# Patient Record
Sex: Male | Born: 1950 | Race: White | Hispanic: No | Marital: Married | Smoking: Current every day smoker
Health system: Southern US, Community
[De-identification: ages and names within clinical notes are randomized; demographics above are authoritative.]

## PROBLEM LIST (undated history)

## (undated) DIAGNOSIS — R7303 Prediabetes: Secondary | ICD-10-CM

## (undated) DIAGNOSIS — I4891 Unspecified atrial fibrillation: Secondary | ICD-10-CM

## (undated) DIAGNOSIS — I739 Peripheral vascular disease, unspecified: Secondary | ICD-10-CM

## (undated) DIAGNOSIS — J449 Chronic obstructive pulmonary disease, unspecified: Secondary | ICD-10-CM

## (undated) DIAGNOSIS — Z95810 Presence of automatic (implantable) cardiac defibrillator: Secondary | ICD-10-CM

## (undated) DIAGNOSIS — I2510 Atherosclerotic heart disease of native coronary artery without angina pectoris: Secondary | ICD-10-CM

## (undated) DIAGNOSIS — I509 Heart failure, unspecified: Secondary | ICD-10-CM

## (undated) DIAGNOSIS — E7800 Pure hypercholesterolemia, unspecified: Secondary | ICD-10-CM

## (undated) DIAGNOSIS — Z9981 Dependence on supplemental oxygen: Secondary | ICD-10-CM

## (undated) DIAGNOSIS — I499 Cardiac arrhythmia, unspecified: Secondary | ICD-10-CM

## (undated) DIAGNOSIS — I219 Acute myocardial infarction, unspecified: Secondary | ICD-10-CM

## (undated) DIAGNOSIS — T8859XA Other complications of anesthesia, initial encounter: Secondary | ICD-10-CM

## (undated) DIAGNOSIS — I255 Ischemic cardiomyopathy: Secondary | ICD-10-CM

## (undated) DIAGNOSIS — I4819 Other persistent atrial fibrillation: Secondary | ICD-10-CM

## (undated) DIAGNOSIS — T4145XA Adverse effect of unspecified anesthetic, initial encounter: Secondary | ICD-10-CM

## (undated) DIAGNOSIS — F419 Anxiety disorder, unspecified: Secondary | ICD-10-CM

## (undated) DIAGNOSIS — I10 Essential (primary) hypertension: Secondary | ICD-10-CM

## (undated) HISTORY — PX: CORONARY ANGIOPLASTY: SHX604

## (undated) HISTORY — PX: ANGIOPLASTY / STENTING FEMORAL: SUR30

---

## 2000-10-21 DIAGNOSIS — I219 Acute myocardial infarction, unspecified: Secondary | ICD-10-CM

## 2000-10-21 HISTORY — PX: CORONARY ARTERY BYPASS GRAFT: SHX141

## 2000-10-21 HISTORY — PX: CARDIAC CATHETERIZATION: SHX172

## 2000-10-21 HISTORY — DX: Acute myocardial infarction, unspecified: I21.9

## 2008-01-20 HISTORY — PX: CARDIAC DEFIBRILLATOR PLACEMENT: SHX171

## 2015-04-10 ENCOUNTER — Inpatient Hospital Stay (HOSPITAL_COMMUNITY): Payer: Medicare Other

## 2015-04-10 ENCOUNTER — Encounter (HOSPITAL_COMMUNITY): Payer: Self-pay | Admitting: *Deleted

## 2015-04-10 ENCOUNTER — Inpatient Hospital Stay (HOSPITAL_COMMUNITY)
Admission: EM | Admit: 2015-04-10 | Discharge: 2015-04-14 | DRG: 253 | Disposition: A | Discharge status: Home / Self Care | Payer: Medicare Other | Attending: Vascular Surgery | Admitting: Vascular Surgery

## 2015-04-10 ENCOUNTER — Emergency Department (HOSPITAL_COMMUNITY): Payer: Medicare Other

## 2015-04-10 DIAGNOSIS — Z95810 Presence of automatic (implantable) cardiac defibrillator: Secondary | ICD-10-CM | POA: Diagnosis not present

## 2015-04-10 DIAGNOSIS — I482 Chronic atrial fibrillation: Secondary | ICD-10-CM | POA: Diagnosis not present

## 2015-04-10 DIAGNOSIS — D696 Thrombocytopenia, unspecified: Secondary | ICD-10-CM | POA: Diagnosis not present

## 2015-04-10 DIAGNOSIS — Z7902 Long term (current) use of antithrombotics/antiplatelets: Secondary | ICD-10-CM | POA: Diagnosis not present

## 2015-04-10 DIAGNOSIS — Z01818 Encounter for other preprocedural examination: Secondary | ICD-10-CM

## 2015-04-10 DIAGNOSIS — E780 Pure hypercholesterolemia: Secondary | ICD-10-CM | POA: Diagnosis present

## 2015-04-10 DIAGNOSIS — I4891 Unspecified atrial fibrillation: Secondary | ICD-10-CM | POA: Diagnosis present

## 2015-04-10 DIAGNOSIS — T82868A Thrombosis of vascular prosthetic devices, implants and grafts, initial encounter: Principal | ICD-10-CM | POA: Diagnosis present

## 2015-04-10 DIAGNOSIS — I252 Old myocardial infarction: Secondary | ICD-10-CM | POA: Diagnosis not present

## 2015-04-10 DIAGNOSIS — I2510 Atherosclerotic heart disease of native coronary artery without angina pectoris: Secondary | ICD-10-CM | POA: Diagnosis present

## 2015-04-10 DIAGNOSIS — I509 Heart failure, unspecified: Secondary | ICD-10-CM | POA: Diagnosis not present

## 2015-04-10 DIAGNOSIS — Z880 Allergy status to penicillin: Secondary | ICD-10-CM | POA: Diagnosis not present

## 2015-04-10 DIAGNOSIS — Z9889 Other specified postprocedural states: Secondary | ICD-10-CM | POA: Diagnosis not present

## 2015-04-10 DIAGNOSIS — Z951 Presence of aortocoronary bypass graft: Secondary | ICD-10-CM | POA: Diagnosis not present

## 2015-04-10 DIAGNOSIS — M79606 Pain in leg, unspecified: Secondary | ICD-10-CM

## 2015-04-10 DIAGNOSIS — I472 Ventricular tachycardia: Secondary | ICD-10-CM | POA: Diagnosis not present

## 2015-04-10 DIAGNOSIS — I745 Embolism and thrombosis of iliac artery: Secondary | ICD-10-CM | POA: Diagnosis present

## 2015-04-10 DIAGNOSIS — I739 Peripheral vascular disease, unspecified: Secondary | ICD-10-CM | POA: Diagnosis present

## 2015-04-10 DIAGNOSIS — M79604 Pain in right leg: Secondary | ICD-10-CM

## 2015-04-10 DIAGNOSIS — I5022 Chronic systolic (congestive) heart failure: Secondary | ICD-10-CM | POA: Diagnosis present

## 2015-04-10 DIAGNOSIS — I748 Embolism and thrombosis of other arteries: Secondary | ICD-10-CM | POA: Diagnosis present

## 2015-04-10 DIAGNOSIS — J449 Chronic obstructive pulmonary disease, unspecified: Secondary | ICD-10-CM | POA: Diagnosis present

## 2015-04-10 DIAGNOSIS — F17210 Nicotine dependence, cigarettes, uncomplicated: Secondary | ICD-10-CM | POA: Diagnosis present

## 2015-04-10 DIAGNOSIS — I70211 Atherosclerosis of native arteries of extremities with intermittent claudication, right leg: Secondary | ICD-10-CM | POA: Diagnosis present

## 2015-04-10 DIAGNOSIS — I10 Essential (primary) hypertension: Secondary | ICD-10-CM | POA: Diagnosis present

## 2015-04-10 DIAGNOSIS — Z01810 Encounter for preprocedural cardiovascular examination: Secondary | ICD-10-CM

## 2015-04-10 DIAGNOSIS — I481 Persistent atrial fibrillation: Secondary | ICD-10-CM | POA: Diagnosis not present

## 2015-04-10 HISTORY — DX: Chronic obstructive pulmonary disease, unspecified: J44.9

## 2015-04-10 HISTORY — DX: Pure hypercholesterolemia, unspecified: E78.00

## 2015-04-10 HISTORY — DX: Acute myocardial infarction, unspecified: I21.9

## 2015-04-10 HISTORY — DX: Atherosclerotic heart disease of native coronary artery without angina pectoris: I25.10

## 2015-04-10 HISTORY — DX: Essential (primary) hypertension: I10

## 2015-04-10 HISTORY — DX: Peripheral vascular disease, unspecified: I73.9

## 2015-04-10 HISTORY — DX: Presence of automatic (implantable) cardiac defibrillator: Z95.810

## 2015-04-10 HISTORY — DX: Heart failure, unspecified: I50.9

## 2015-04-10 LAB — CBC WITH DIFFERENTIAL/PLATELET
Basophils Absolute: 0 10*3/uL (ref 0.0–0.1)
Basophils Relative: 0 % (ref 0–1)
EOS ABS: 0.1 10*3/uL (ref 0.0–0.7)
EOS PCT: 1 % (ref 0–5)
HCT: 49.5 % (ref 39.0–52.0)
HEMOGLOBIN: 17.3 g/dL — AB (ref 13.0–17.0)
LYMPHS ABS: 4.3 10*3/uL — AB (ref 0.7–4.0)
Lymphocytes Relative: 34 % (ref 12–46)
MCH: 34.5 pg — AB (ref 26.0–34.0)
MCHC: 34.9 g/dL (ref 30.0–36.0)
MCV: 98.6 fL (ref 78.0–100.0)
MONOS PCT: 8 % (ref 3–12)
Monocytes Absolute: 1 10*3/uL (ref 0.1–1.0)
Neutro Abs: 7.1 10*3/uL (ref 1.7–7.7)
Neutrophils Relative %: 57 % (ref 43–77)
Platelets: 178 10*3/uL (ref 150–400)
RBC: 5.02 MIL/uL (ref 4.22–5.81)
RDW: 13.4 % (ref 11.5–15.5)
WBC: 12.5 10*3/uL — ABNORMAL HIGH (ref 4.0–10.5)

## 2015-04-10 LAB — BASIC METABOLIC PANEL
Anion gap: 11 (ref 5–15)
BUN: 23 mg/dL — ABNORMAL HIGH (ref 6–20)
CALCIUM: 8.7 mg/dL — AB (ref 8.9–10.3)
CO2: 25 mmol/L (ref 22–32)
CREATININE: 1.13 mg/dL (ref 0.61–1.24)
Chloride: 102 mmol/L (ref 101–111)
Glucose, Bld: 116 mg/dL — ABNORMAL HIGH (ref 65–99)
POTASSIUM: 3.7 mmol/L (ref 3.5–5.1)
Sodium: 138 mmol/L (ref 135–145)

## 2015-04-10 LAB — APTT: aPTT: 31 seconds (ref 24–37)

## 2015-04-10 LAB — PROTIME-INR
INR: 1.16 (ref 0.00–1.49)
PROTHROMBIN TIME: 14.9 s (ref 11.6–15.2)

## 2015-04-10 LAB — BRAIN NATRIURETIC PEPTIDE: B Natriuretic Peptide: 1230.6 pg/mL — ABNORMAL HIGH (ref 0.0–100.0)

## 2015-04-10 MED ORDER — ASPIRIN 81 MG PO CHEW
81.0000 mg | CHEWABLE_TABLET | Freq: Every day | ORAL | Status: DC
Start: 2015-04-10 — End: 2015-04-14
  Administered 2015-04-10 – 2015-04-14 (×4): 81 mg via ORAL
  Filled 2015-04-10 (×5): qty 1

## 2015-04-10 MED ORDER — SODIUM CHLORIDE 0.9 % IJ SOLN
3.0000 mL | INTRAMUSCULAR | Status: DC | PRN
  Administered 2015-04-14: 3 mL via INTRAVENOUS
  Filled 2015-04-10: qty 3

## 2015-04-10 MED ORDER — PREDNISONE 50 MG PO TABS
50.0000 mg | ORAL_TABLET | Freq: Every day | ORAL | Status: DC
  Administered 2015-04-10 – 2015-04-14 (×3): 50 mg via ORAL
  Filled 2015-04-10 (×5): qty 1

## 2015-04-10 MED ORDER — HEPARIN BOLUS VIA INFUSION
4000.0000 [IU] | Freq: Once | INTRAVENOUS | Status: AC
  Administered 2015-04-10: 4000 [IU] via INTRAVENOUS
  Filled 2015-04-10: qty 4000

## 2015-04-10 MED ORDER — ONDANSETRON HCL 4 MG/2ML IJ SOLN: 4.0000 mg | Freq: Four times a day (QID) | INTRAMUSCULAR | Status: DC | PRN

## 2015-04-10 MED ORDER — IOHEXOL 350 MG/ML SOLN
100.0000 mL | Freq: Once | INTRAVENOUS | Status: AC | PRN
  Administered 2015-04-10: 100 mL via INTRAVENOUS

## 2015-04-10 MED ORDER — ENALAPRIL MALEATE 20 MG PO TABS
20.0000 mg | ORAL_TABLET | Freq: Every day | ORAL | Status: DC
  Administered 2015-04-10 – 2015-04-14 (×4): 20 mg via ORAL
  Filled 2015-04-10 (×5): qty 1

## 2015-04-10 MED ORDER — ALUM & MAG HYDROXIDE-SIMETH 200-200-20 MG/5ML PO SUSP: 15.0000 mL | ORAL | Status: DC | PRN

## 2015-04-10 MED ORDER — FUROSEMIDE 20 MG PO TABS
20.0000 mg | ORAL_TABLET | Freq: Every day | ORAL | Status: DC
  Administered 2015-04-10 – 2015-04-14 (×4): 20 mg via ORAL
  Filled 2015-04-10 (×5): qty 1

## 2015-04-10 MED ORDER — SODIUM CHLORIDE 0.9 % IJ SOLN
3.0000 mL | Freq: Two times a day (BID) | INTRAMUSCULAR | Status: DC
  Administered 2015-04-10 – 2015-04-13 (×4): 3 mL via INTRAVENOUS

## 2015-04-10 MED ORDER — PANTOPRAZOLE SODIUM 40 MG PO TBEC
40.0000 mg | DELAYED_RELEASE_TABLET | Freq: Every day | ORAL | Status: DC
  Administered 2015-04-10 – 2015-04-14 (×4): 40 mg via ORAL
  Filled 2015-04-10 (×5): qty 1

## 2015-04-10 MED ORDER — CARVEDILOL 25 MG PO TABS
25.0000 mg | ORAL_TABLET | Freq: Two times a day (BID) | ORAL | Status: DC
  Administered 2015-04-11 – 2015-04-13 (×5): 25 mg via ORAL
  Filled 2015-04-10 (×9): qty 1

## 2015-04-10 MED ORDER — HEPARIN (PORCINE) IN NACL 100-0.45 UNIT/ML-% IJ SOLN
1400.0000 [IU]/h | INTRAMUSCULAR | Status: DC
  Administered 2015-04-10: 1000 [IU]/h via INTRAVENOUS
  Administered 2015-04-12: 1200 [IU]/h via INTRAVENOUS
  Filled 2015-04-10 (×4): qty 250

## 2015-04-10 MED ORDER — SODIUM CHLORIDE 0.9 % IV SOLN: 250.0000 mL | INTRAVENOUS | Status: DC | PRN

## 2015-04-10 MED ORDER — GUAIFENESIN-DM 100-10 MG/5ML PO SYRP: 15.0000 mL | ORAL_SOLUTION | ORAL | Status: DC | PRN

## 2015-04-10 MED ORDER — POTASSIUM CHLORIDE CRYS ER 20 MEQ PO TBCR
20.0000 meq | EXTENDED_RELEASE_TABLET | Freq: Once | ORAL | Status: AC
Start: 2015-04-10 — End: 2015-04-10
  Administered 2015-04-10: 20 meq via ORAL
  Filled 2015-04-10: qty 1

## 2015-04-10 MED ORDER — METOPROLOL TARTRATE 1 MG/ML IV SOLN: 2.0000 mg | INTRAVENOUS | Status: DC | PRN

## 2015-04-10 MED ORDER — HYDRALAZINE HCL 20 MG/ML IJ SOLN: 5.0000 mg | INTRAMUSCULAR | Status: DC | PRN

## 2015-04-10 MED ORDER — OXYCODONE-ACETAMINOPHEN 5-325 MG PO TABS
1.0000 | ORAL_TABLET | ORAL | Status: DC | PRN
  Administered 2015-04-13 (×2): 1 via ORAL
  Filled 2015-04-10 (×2): qty 1

## 2015-04-10 MED ORDER — ZOLPIDEM TARTRATE 5 MG PO TABS
5.0000 mg | ORAL_TABLET | Freq: Every evening | ORAL | Status: DC | PRN
  Administered 2015-04-12 – 2015-04-13 (×2): 5 mg via ORAL
  Filled 2015-04-10 (×2): qty 1

## 2015-04-10 MED ORDER — PHENOL 1.4 % MT LIQD
1.0000 | OROMUCOSAL | Status: DC | PRN
  Filled 2015-04-10: qty 177

## 2015-04-10 MED ORDER — LABETALOL HCL 5 MG/ML IV SOLN
10.0000 mg | INTRAVENOUS | Status: DC | PRN
  Filled 2015-04-10: qty 4

## 2015-04-10 MED ORDER — ATORVASTATIN CALCIUM 80 MG PO TABS
80.0000 mg | ORAL_TABLET | Freq: Every day | ORAL | Status: DC
Start: 2015-04-10 — End: 2015-04-14
  Administered 2015-04-10 – 2015-04-14 (×4): 80 mg via ORAL
  Filled 2015-04-10 (×5): qty 1

## 2015-04-10 NOTE — ED Notes (Signed)
Able to find popliteal pulse with doppler ,  Unable to palpate.

## 2015-04-10 NOTE — Consult Note (Signed)
Referring Physician: Dr. Arbie Cookey with vascular surgery Primary Physician: Truddie Coco, FNP  Reason for Consultation: perioperative CV evaluation and risk assessment  HPI:  The patient is a 64 year old gentleman with past medical history of coronary artery disease status post CABG in 2002 at [facility], systolic congestive heart failure status post ICD placement in 2012 in [place] of [place] as well as multiple vascular surgeries including multiple stents placed in his right common iliac artery and right superficial femoral artery. He subsequently underwent right axillary to femoral and femoral-popliteal bypass.  He presented with 5 days of right leg claudication symptoms. Toes are well-aerated by Dr. early and is currently planned for peripheral vascular intervention with thrombectomy and possibly redo right axillary-femoral bypass.  Patient appears to be somewhat poor historian. He does not remember all of his cardiovascular medications he also has given somewhat inconsistent story to me and Dr. Arbie Cookey. So he told Dr. early that he smokes a pack of cigarettes per day and he told me that he smokes half a pack of cigarettes. Patient stated he takes both aspirin and Plavix and doesn't take any warfarin despite history of atrial fibrillation, patient's profile was notable for Plavix only. Patient reported that his last heart failure exacerbation was 5 days ago and he was admitted to [facility] however his symptoms of claudication also started about 5 days ago??. Patient reported occasional heart failure exacerbations to Dr. early but told me that his last heart failure exacerbation was 4 years ago. In terms of patient's last heart failure exacerbation when he was admitted to [facility] patient reported that precipitating event was drinking too much water secondary to hot weather?? He apparently didn't undergo any ischemic evaluation and  he reported that his last stress test was about 2 years ago which at that time was reportedly negative for significant ischemia.  At baseline patient is able to walk about 200 feet prior to getting claudication symptoms, he denied any chest pain or shortness of breath. He sleeps with 2 pillows.  Patient denied active symptoms of chest pain, shortness of breath, syncope or presyncope, lower extremity edema, PND or orthopnea, frequent or prolonged palpitations  Review of Systems:  12 systems were reviewed nad were negative except mentioned in the HPI    Past Medical History  Diagnosis Date  . CHF (congestive heart failure)   . Hypertension   . AICD (automatic cardioverter/defibrillator) present   . COPD (chronic obstructive pulmonary disease)    Past Surgical History  Procedure Laterality Date  . Femoral bypass    . Cardiac surgery    . Coronary artery bypass graft       Current Medications:  Infusions:   Prescriptions prior to admission  Medication Sig Dispense Refill Last Dose  . atorvastatin (LIPITOR) 80 MG tablet Take 80 mg by mouth daily.   04/09/2015 at Unknown time  . carvedilol (COREG) 25 MG tablet Take 25 mg by mouth 2 (two) times daily with a meal.   04/09/2015 at 2000  . clopidogrel (PLAVIX) 75 MG tablet Take 75 mg by mouth daily.   04/09/2015 at Unknown time  . enalapril (VASOTEC) 20 MG tablet Take 20 mg by mouth daily.   04/09/2015  . furosemide (LASIX) 20 MG tablet Take 20 mg by mouth daily.   04/09/2015 at Unknown time  . predniSONE (DELTASONE) 50 MG tablet Take 50 mg by mouth daily. 5 day course starting 04/04/15   Completed Course at  Unknown time     Allergies  Allergen Reactions  . Penicillins Swelling    History   Social History  . Marital Status: Married    Spouse Name: N/A  . Number of Children: N/A  . Years of Education: N/A   Occupational History  . Not on file.   Social History Main Topics  . Smoking status: Current Every Day Smoker  . Smokeless  tobacco: Not on file  . Alcohol Use: No  . Drug Use: No  . Sexual Activity: Not on file   Other Topics Concern  . Not on file   Social History Narrative  . No narrative on file    History reviewed. No pertinent family history. No family status information on file.    PHYSICAL EXAM: Filed Vitals:   04/10/15 2100  BP: 116/69  Pulse: 66  Temp:   Resp: 20    No intake or output data in the 24 hours ending 04/10/15 2131  General:  Well appearing. No respiratory difficulty HEENT: normal Neck: supple. no lymphadenopathy or thryomegaly appreciated. Cor: PMI nondisplaced. Irregularly irregular rate and rhythm. No rubs, gallops or murmurs. Lungs: Coarse breath sounds no apparent wheezings Abdomen: soft, nontender, nondistended. No hepatosplenomegaly. No bruits or masses. Good bowel sounds. Extremities: no cyanosis, clubbing, rash, edema. Cold to touch right lower extremity Neuro: alert & oriented x 3, cranial nerves grossly intact. moves all 4 extremities w/o difficulty. Affect pleasant.  Bedside ultrasound examination: Lungs: Bilateral A-line pattern, bilateral lung sliding, no evidence of pleural effusions CV: Moderately to severely dilated left ventricle with severely decreased left ventricular ejection fraction, right ventricle is poorly visualized but probably at least mildly dilated, mild mitral regurgitation, no significant tricuspid or aortic regurgitation appreciated, normal-appearing aortic valve, inferior vena cava 1.9 cm with more than 50% respiratory variation. Abdomen: No evidence of ascites    Results for orders placed or performed during the hospital encounter of 04/10/15 (from the past 24 hour(s))  CBC with Differential     Status: Abnormal   Collection Time: 04/10/15  2:59 PM  Result Value Ref Range   WBC 12.5 (H) 4.0 - 10.5 K/uL   RBC 5.02 4.22 - 5.81 MIL/uL   Hemoglobin 17.3 (H) 13.0 - 17.0 g/dL   HCT 16.1 09.6 - 04.5 %   MCV 98.6 78.0 - 100.0 fL   MCH 34.5  (H) 26.0 - 34.0 pg   MCHC 34.9 30.0 - 36.0 g/dL   RDW 40.9 81.1 - 91.4 %   Platelets 178 150 - 400 K/uL   Neutrophils Relative % 57 43 - 77 %   Neutro Abs 7.1 1.7 - 7.7 K/uL   Lymphocytes Relative 34 12 - 46 %   Lymphs Abs 4.3 (H) 0.7 - 4.0 K/uL   Monocytes Relative 8 3 - 12 %   Monocytes Absolute 1.0 0.1 - 1.0 K/uL   Eosinophils Relative 1 0 - 5 %   Eosinophils Absolute 0.1 0.0 - 0.7 K/uL   Basophils Relative 0 0 - 1 %   Basophils Absolute 0.0 0.0 - 0.1 K/uL  Basic metabolic panel     Status: Abnormal   Collection Time: 04/10/15  2:59 PM  Result Value Ref Range   Sodium 138 135 - 145 mmol/L   Potassium 3.7 3.5 - 5.1 mmol/L   Chloride 102 101 - 111 mmol/L   CO2 25 22 - 32 mmol/L   Glucose, Bld 116 (H) 65 - 99 mg/dL   BUN 23 (H) 6 -  20 mg/dL   Creatinine, Ser 4.09 0.61 - 1.24 mg/dL   Calcium 8.7 (L) 8.9 - 10.3 mg/dL   GFR calc non Af Amer >60 >60 mL/min   GFR calc Af Amer >60 >60 mL/min   Anion gap 11 5 - 15  Protime-INR     Status: None   Collection Time: 04/10/15  2:59 PM  Result Value Ref Range   Prothrombin Time 14.9 11.6 - 15.2 seconds   INR 1.16 0.00 - 1.49  APTT     Status: None   Collection Time: 04/10/15  2:59 PM  Result Value Ref Range   aPTT 31 24 - 37 seconds   Radiology:  Ct Angio Ao+bifem W/cm &/or Wo/cm  04/10/2015   CLINICAL DATA:  Right leg pain.  History of fem-pop bypass.  EXAM: CT ANGIOGRAPHY OF ABDOMINAL AORTA WITH ILIOFEMORAL RUNOFF  TECHNIQUE: Multidetector CT imaging of the abdomen, pelvis and lower extremities was performed using the standard protocol during bolus administration of intravenous contrast. Multiplanar CT image reconstructions and MIPs were obtained to evaluate the vascular anatomy.  CONTRAST:  OMNIPAQUE IOHEXOL 350 MG/ML SOLN  COMPARISON:  None.  FINDINGS: Aorta: There is irregular mural thrombus in the suprarenal abdominal aorta at the level of the celiac trunk. There is irregular mural thrombus throughout the infrarenal abdominal  aorta without aneurysm. The celiac trunk, SMA and IMA are patent. Bilateral renal arteries are patent.  Right Lower Extremity: There are occluded metallic stents involving the right common iliac artery. No significant flow in the right common iliac artery or right external iliac artery. Distal reconstitution of the right internal iliac artery branches. There is a right axillary-femoral bypass graft which is occluded. There is reconstitution of flow at the right common femoral artery and likely through pelvic collaterals. The right profunda femoral arteries are patent. The native right SFA is severely stenosed and occludes in the proximal segment. Majority of the right SFA is stented and occluded. There is a patent right femoral-popliteal bypass graft. There appears to be a contrast-fluid layer within the right femoral-popliteal bypass probably related to the slow flow. However, there may be stenosis or thrombus involving the proximal and mid aspect of the graft. In particular, there is concern for focal stenosis on sequence 6, image 154. There appears to be proximal occlusion of the runoff vessels with questionable reconstitution of the posterior tibial artery and peroneal artery.  Left Lower Extremity: Left common iliac artery has diffuse disease without significant stenosis. There is stenosis at the origin of the left internal iliac artery. Diffuse plaque involving the left external iliac artery without a critical stenosis. The left common femoral artery is patent. The left profunda femoral arteries are patent. There is segmental disease and stenosis throughout the proximal left superficial femoral artery. There is occlusion of the mid left SFA with reconstitution of the left popliteal artery. There is critical stenosis of the left popliteal artery at the knee joint. Left popliteal artery artery is displaced medial due to the exophytic tibial bone lesion. There appears to be least a short-segment occlusion of the  popliteal artery. Reconstitution of the anterior tibial artery and peroneal artery. Distal reconstitution of the posterior tibial artery. Flow across the ankle from the dorsalis pedis artery.  Nonvascular structures: Lung bases are clear. There is a large amount of contrast refluxing into the hepatic veins. No gross abnormality to the liver, gallbladder or spleen. Normal appearance of the pancreas. There is a 1.9 cm nodule involving the left  adrenal gland with Hounsfield units of 4. This is compatible with a left adrenal adenoma. Normal appearance of the right adrenal gland. Exophytic low-density cyst involving the right kidney measures 3.4 cm. There is at least 1 additional right renal cyst. Evidence for multiple left renal cysts. Largest left renal cyst appears to have peripheral wall calcification and measures up to 5.7 cm. This large left renal cyst also appears to have a small lobulated component. No significant lymphadenopathy in the abdomen or pelvis. There is a small amount of free fluid in the pelvis. No gross abnormality to the prostate or urinary bladder. There is mild stranding or edema along the left lower quadrant and left paracolic gutter region. No acute abnormality to the appendix. No gross abnormality to the small or large bowel.  There is an exophytic bony lesion along the posterior lateral aspect of the proximal tibia. This lesion has patchy areas of sclerosis and measures roughly 4.6 x 4.9 x 3.8 cm. This lesion appears to be displacing and remodeling the proximal fibula with some bony fusion. This is likely a slow growing benign lesion such as a osteochondroma.  Review of the MIP images confirms the above findings.  IMPRESSION: Occluded right axillary-femoral bypass. The right common iliac artery and right external iliac artery are occluded. Reconstitution of the right lower extremity arteries through collateral flow.  Patent right femoral-popliteal bypass but there is concern for nonocclusive  thrombus or stenosis within the graft.  Severe right runoff disease with minimal flow in the right calf arteries.  Left outflow disease demonstrated by occlusion of the mid and distal left SFA. Critical stenosis and occlusion of the mid and distal left popliteal artery.  Extensive atherosclerotic disease in the abdominal aorta with irregular mural thrombus. Negative for an abdominal aortic aneurysm.  Small amount of free fluid in the pelvis with edema or stranding in the left lower quadrant of the abdomen. These findings are nonspecific.  Bilateral renal cysts. Dominant left renal cyst appears to be at least mildly complex. Consider further evaluation with ultrasound.  Unusual exophytic bony lesion involving the proximal left tibia which appears to be displacing and fusing with the proximal left fibula. 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   Electronically Signed   By: Richarda Overlie M.D.   On: 04/10/2015 17:48    ASSESSMENT: #Chronic Systolic CHF with NYHA class 3  recent within 1 week exacerbation euvolemic at the moment. Warm and Dry  #CAD s/p CABG in 2012  last ischemic evaluation ~2  #s/p ICD (possibly medtronic) # Afib (not on warfarin) # HTN #HLD #Tobacco use  DISCUSSION:  Overall patient is not the best historian. His functional status is not clear it appears that he was able to walk about 200 feet before he gets claudication. This was his functional status prior to current acute limb ischemia event.  Patient currently appears to be euvolemic and compensated from the heart failure standpoint however he hasn't had recent ischemic evaluation and it is unclear what exactly precipitated his recent heart failure exacerbation, ischemia could be one of the possibilities, especially his previous exacerbation of congestive heart failure was 3-4 years ago. Given the fact that his thrombectomy is planned for 6/22 it is not unreasonable to obtain noninvasive ischemic evaluation with  myocardial perfusion stress testing to rule out high-risk ischemia. I would prefer myocardial-perfusion stress test given recent IV contrast load, mural thrombus in the descending aorta on the CTA. Unknown coronary anatomy In conversation with Dr. Arbie Cookey it  appears that he'll be able to operate dual into play therapy if needed. Curently patient is taking Plavix and aspirin per report however he has only Plavix and his medication list.  The following orders were placed by me:  - EKG - pro-BNP - ECHO - SPECT stress - asa 81 mg daily - strict I/O; daily weights  Recommendations:  - cont coreg 25 mg BID, enalapril 20 mg BID (hold on the morning of the procedure), lipitor 80 mg daily - clarify ICD brand (pt will need pre-op interrogation) - wife has an ICD card. - cont lasix 20 mg daily - smoking cessation counseling  Will cont to follow.    Nolon Nations, MD 04/10/2015 9:31 PM

## 2015-04-10 NOTE — ED Notes (Signed)
Vascular surgeon at bedside.

## 2015-04-10 NOTE — ED Notes (Signed)
Dr. Early at bedside 

## 2015-04-10 NOTE — Progress Notes (Signed)
ANTICOAGULATION CONSULT NOTE - Initial Consult  Pharmacy Consult for heparin Indication: Peripheral vascular disease with occluded axillofemoral bypass  Allergies  Allergen Reactions  . Penicillins Swelling    Patient Measurements: Height: 5\' 6"  (167.6 cm) Weight: 147 lb 11.2 oz (66.996 kg) IBW/kg (Calculated) : 63.8 Heparin Dosing Weight: 67kg  Vital Signs: Temp: 97.7 F (36.5 C) (06/20 2131) Temp Source: Oral (06/20 2131) BP: 108/56 mmHg (06/20 2131) Pulse Rate: 76 (06/20 2131)  Labs:  Recent Labs  04/10/15 1459  HGB 17.3*  HCT 49.5  PLT 178  APTT 31  LABPROT 14.9  INR 1.16  CREATININE 1.13    Estimated Creatinine Clearance: 61.2 mL/min (by C-G formula based on Cr of 1.13).   Medical History: Past Medical History  Diagnosis Date  . CHF (congestive heart failure)   . Hypertension   . AICD (automatic cardioverter/defibrillator) present   . COPD (chronic obstructive pulmonary disease)    Assessment: 64 year old gentleman with a complex past peripheral vascular and cardiac history. Patient reports that 5 days ago he had sudden onset of right leg claudication symptoms. Patient does not appear to be on anticoagulation prior to admit and INR was normal.  New orders to start IV heparin for peripheral vascular disease with occluded axillofemoral bypass.   Goal of Therapy:  Heparin level 0.3-0.7 units/ml Monitor platelets by anticoagulation protocol: Yes   Plan:  Give 4000 units bolus x 1 Start heparin infusion at 1000 units/hr Check anti-Xa level in 6 hours and daily while on heparin Continue to monitor H&H and platelets  Sheppard Coil PharmD., BCPS Clinical Pharmacist Pager [phone] 04/10/2015 9:59 PM

## 2015-04-10 NOTE — ED Notes (Signed)
unable to doppler DP or PT pulse in right foot.

## 2015-04-10 NOTE — ED Notes (Addendum)
Pain rt leg, for 1 week , Has had a fem pop 2 years ago.  Now increased pain and diminished pulse.  Pt recent tx at [facility] ED

## 2015-04-10 NOTE — H&P (Signed)
Patient name: Anthony Meadows MRN: [id number] DOB: Oct 11, 1951 Sex: male   Referred by: EDP  Reason for referral:  Chief Complaint  Patient presents with  . Leg Pain    HISTORY OF PRESENT ILLNESS: The patient is a 64 year old gentleman with a complex past peripheral vascular and cardiac history. He is status post multiple revascularizations of his right lower extremity and outlying hospitals in [place]. Also status post coronary artery bypass grafting in 2003 and also status post AICD placement. He reports that 5 days ago he had sudden onset of right leg claudication symptoms. There was no progression of this. Happened suddenly and he has been trying to "walk it off" for 5 days. He reports that he does not become any worse. He did present to [facility] emergency department this evening for further evaluation. He denies any rest pain. He reports that he can walk but after a few minutes he does have cramping and burning in his right calf and reports that if he continues to walk this can extend into his thigh and he has to stop. Not have any tissue loss. Does report that he does have occasional exacerbation of his congestive heart failure. Reports that he went to an outlying hospital and [place] within the last 2 weeks with the fluid overload and required diuresis. He does have COPD but unfortunately continue to smoke a pack cigarettes per day.  He does have a history of multiple stents placed in his right common iliac artery and right superficial femoral artery. He subsequently underwent right axillary to femoral and femoral-popliteal bypass. The patient recalls that this was all during the same procedure approximate 4 years ago. He apparently had a very stormy postoperative course. Had difficulty being extubated and eventually was able to leave the hospital.  Past Medical History  Diagnosis Date  . CHF (congestive heart failure)   . Hypertension   . AICD (automatic  cardioverter/defibrillator) present   . COPD (chronic obstructive pulmonary disease)     Past Surgical History  Procedure Laterality Date  . Femoral bypass    . Cardiac surgery    . Coronary artery bypass graft      History   Social History  . Marital Status: Married    Spouse Name: N/A  . Number of Children: N/A  . Years of Education: N/A   Occupational History  . Not on file.   Social History Main Topics  . Smoking status: Current Every Day Smoker  . Smokeless tobacco: Not on file  . Alcohol Use: No  . Drug Use: No  . Sexual Activity: Not on file   Other Topics Concern  . Not on file   Social History Narrative  . No narrative on file    History reviewed. No pertinent family history.  Allergies as of 04/10/2015 - Review Complete 04/10/2015  Allergen Reaction Noted  . Penicillins Swelling 04/10/2015    No current facility-administered medications on file prior to encounter.   No current outpatient prescriptions on file prior to encounter.     REVIEW OF SYSTEMS:  Negative aside from past medical history  PHYSICAL EXAMINATION:  General: The patient is a well-nourished male, in no acute distress. Vital signs are BP 128/81 mmHg  Pulse 89  Temp(Src) 97.9 F (36.6 C) (Oral)  Resp 22  Ht  (1.676 m)  Wt 148 lb (67.132 kg)  BMI 23.90 kg/m2  SpO2 97% Pulmonary: There is a good air exchange Abdomen: Soft and non-tender  with no masses noted Musculoskeletal: There are no major deformities.  There is no significant extremity pain. Neurologic: No focal weakness or paresthesias are detected, Skin: There are no ulcer or rashes noted. Psychiatric: The patient has normal affect. Cardiovascular: 2+ radial pulses bilaterally. 2+ left femoral pulse and 1+ left dorsalis pedis pulse Easily palpable axillofemoral bypass in the subcutaneous position with no pulse present. No pedal pulses on the right and no Doppler flow   CT angiogram from this evening was  reviewed and discussed with the patient. This does show old occluded right common iliac artery with stent in place. Also has complete occlusion of the superficial femoral artery from the origin with multiple stents throughout the course of the right superficial femoral artery. The right axillary to femoral bypass is occluded. Stanley his right femoral to popliteal below-knee bypass is patent by collaterals from the profunda.  Impression and Plan:  Severe claudication right leg related to occluded axillofemoral bypass. The patient is a completely intact motor and sensory to his right foot. This occurred 5 days ago and he has had no worsening of his symptoms since then. With his severe cardiac disease have recommended that we admit him tonight. Will be placed on heparin per pharmacy dosing. I have discussed his case with the physician on call for [facility] heart [facility] who will see him in consultation for optimization of his preoperative status. Tentatively plan for thrombectomy and possible revision of his right axillary to femoral bypass on 04/12/2015. Understands this will be done urgently should he develop any worsening ischemia.    Gretta Began [facility] Office: [phone]

## 2015-04-10 NOTE — ED Provider Notes (Signed)
CSN: [id number]     Arrival date & time 04/10/15  1417 History   First MD Initiated Contact with Patient 04/10/15 1431     Chief Complaint  Patient presents with  . Leg Pain     (Consider location/radiation/quality/duration/timing/severity/associated sxs/prior Treatment) HPI...Marland KitchenMarland KitchenMarland Kitchen right lower extremity pain for several days. Status post RLE bypass in [place] approximate 4 years ago, p. Past medical history includes congestive heart failure, hypertension, cardioverter/defibrillator, COPD, smoking, CABG.  His foot is cool to palpation.  Past Medical History  Diagnosis Date  . CHF (congestive heart failure)   . Hypertension   . AICD (automatic cardioverter/defibrillator) present   . COPD (chronic obstructive pulmonary disease)    Past Surgical History  Procedure Laterality Date  . Femoral bypass    . Cardiac surgery    . Coronary artery bypass graft     History reviewed. No pertinent family history. History  Substance Use Topics  . Smoking status: Current Every Day Smoker  . Smokeless tobacco: Not on file  . Alcohol Use: No    Review of Systems    Allergies  Penicillins  Home Medications   Prior to Admission medications   Medication Sig Start Date End Date Taking? Authorizing Provider  atorvastatin (LIPITOR) 80 MG tablet Take 80 mg by mouth daily.   Yes Historical Provider, MD  carvedilol (COREG) 25 MG tablet Take 25 mg by mouth 2 (two) times daily with a meal.   Yes Historical Provider, MD  clopidogrel (PLAVIX) 75 MG tablet Take 75 mg by mouth daily.   Yes Historical Provider, MD  enalapril (VASOTEC) 20 MG tablet Take 20 mg by mouth daily.   Yes Historical Provider, MD  furosemide (LASIX) 20 MG tablet Take 20 mg by mouth daily.   Yes Historical Provider, MD  predniSONE (DELTASONE) 50 MG tablet Take 50 mg by mouth daily. 5 day course starting 04/04/15 04/04/15   Historical Provider, MD   BP 133/75 mmHg  Pulse 60  Temp(Src) 97.8 F (36.6 C) (Oral)  Resp 15  Ht 5\' 6"   (1.676 m)  Wt 148 lb (67.132 kg)  BMI 23.90 kg/m2  SpO2 94% Physical Exam  ED Course  Procedures (including critical care time) Labs Review Labs Reviewed  CBC WITH DIFFERENTIAL/PLATELET - Abnormal; Notable for the following:    WBC 12.5 (*)    Hemoglobin 17.3 (*)    MCH 34.5 (*)    Lymphs Abs 4.3 (*)    All other components within normal limits  BASIC METABOLIC PANEL - Abnormal; Notable for the following:    Glucose, Bld 116 (*)    BUN 23 (*)    Calcium 8.7 (*)    All other components within normal limits  PROTIME-INR  APTT    Imaging Review No results found.   EKG Interpretation None      MDM   Final diagnoses:  Lower extremity pain, diffuse, right    CT angio of right lower extremity evaluated by vascular surgeon Dr Gretta Began.  Patient has elected to drive to [facility] with his wife where he will be evaluated by the surgeon    Donnetta Hutching, MD 04/10/15 1745

## 2015-04-11 ENCOUNTER — Inpatient Hospital Stay (HOSPITAL_COMMUNITY): Payer: Medicare Other

## 2015-04-11 ENCOUNTER — Encounter (HOSPITAL_COMMUNITY): Payer: Self-pay | Admitting: General Practice

## 2015-04-11 DIAGNOSIS — I10 Essential (primary) hypertension: Secondary | ICD-10-CM

## 2015-04-11 DIAGNOSIS — I509 Heart failure, unspecified: Secondary | ICD-10-CM

## 2015-04-11 DIAGNOSIS — Z01818 Encounter for other preprocedural examination: Secondary | ICD-10-CM

## 2015-04-11 DIAGNOSIS — I4891 Unspecified atrial fibrillation: Secondary | ICD-10-CM | POA: Diagnosis present

## 2015-04-11 DIAGNOSIS — I2510 Atherosclerotic heart disease of native coronary artery without angina pectoris: Secondary | ICD-10-CM

## 2015-04-11 DIAGNOSIS — I482 Chronic atrial fibrillation: Secondary | ICD-10-CM

## 2015-04-11 DIAGNOSIS — I5022 Chronic systolic (congestive) heart failure: Secondary | ICD-10-CM

## 2015-04-11 LAB — CBC
HEMATOCRIT: 49 % (ref 39.0–52.0)
Hemoglobin: 16.7 g/dL (ref 13.0–17.0)
MCH: 33.2 pg (ref 26.0–34.0)
MCHC: 34.1 g/dL (ref 30.0–36.0)
MCV: 97.4 fL (ref 78.0–100.0)
PLATELETS: 166 10*3/uL (ref 150–400)
RBC: 5.03 MIL/uL (ref 4.22–5.81)
RDW: 13.5 % (ref 11.5–15.5)
WBC: 11.1 10*3/uL — ABNORMAL HIGH (ref 4.0–10.5)

## 2015-04-11 LAB — HEPARIN LEVEL (UNFRACTIONATED)
HEPARIN UNFRACTIONATED: 0.11 [IU]/mL — AB (ref 0.30–0.70)
Heparin Unfractionated: 0.41 IU/mL (ref 0.30–0.70)

## 2015-04-11 MED ORDER — TECHNETIUM TC 99M SESTAMIBI GENERIC - CARDIOLITE
10.0000 | Freq: Once | INTRAVENOUS | Status: AC | PRN
  Administered 2015-04-11: 10 via INTRAVENOUS

## 2015-04-11 MED ORDER — REGADENOSON 0.4 MG/5ML IV SOLN
INTRAVENOUS | Status: AC
Start: 2015-04-11 — End: 2015-04-11
  Administered 2015-04-11: 0.4 mg
  Filled 2015-04-11: qty 5

## 2015-04-11 MED ORDER — TECHNETIUM TC 99M SESTAMIBI GENERIC - CARDIOLITE
30.0000 | Freq: Once | INTRAVENOUS | Status: AC | PRN
  Administered 2015-04-11: 30 via INTRAVENOUS

## 2015-04-11 MED ORDER — PERFLUTREN LIPID MICROSPHERE
1.0000 mL | INTRAVENOUS | Status: AC | PRN
  Administered 2015-04-11: 1 mL via INTRAVENOUS
  Administered 2015-04-11: 2 mL via INTRAVENOUS
  Filled 2015-04-11: qty 10

## 2015-04-11 NOTE — Progress Notes (Signed)
ANTICOAGULATION CONSULT NOTE - Follow Up Consult  Pharmacy Consult for heparin Indication: PVD   Labs:  Recent Labs  04/10/15 1459 04/11/15 0454  HGB 17.3* 16.7  HCT 49.5 49.0  PLT 178 166  APTT 31  --   LABPROT 14.9  --   INR 1.16  --   HEPARINUNFRC  --  0.41  CREATININE 1.13  --      Assessment/Plan:  64yo male therapeutic on heparin with initial dosing for occluded axillofermal bypass. Will continue gtt at current rate and confirm stable with additional level.   Vernard Gambles, PharmD, BCPS  04/11/2015,6:02 AM

## 2015-04-11 NOTE — Progress Notes (Signed)
Utilization review completed.  

## 2015-04-11 NOTE — Progress Notes (Signed)
ANTICOAGULATION CONSULT NOTE - Follow Up Consult  Pharmacy Consult for heparin Indication: PVD   Labs:  Recent Labs  04/10/15 1459 04/11/15 0454  HGB 17.3* 16.7  HCT 49.5 49.0  PLT 178 166  APTT 31  --   LABPROT 14.9  --   INR 1.16  --   HEPARINUNFRC  --  0.41  CREATININE 1.13  --      Assessment/Plan:  63yo male admitted 04/10/2015 with  occluded axillofermal bypass. Pharmacy consulted to dose heparin.   Anticoagulation: heparin for occluded bypass; no AC pta; thrombectomy planned for 6/22?  Confirmation heparin level pending, CBC stable no bleeding noted.   Thank you for allowing pharmacy to be a part of this patients care team.  Lovenia Kim Pharm.D., BCPS, AQ-Cardiology Clinical Pharmacist 04/11/2015 11:29 AM Pager: [phone] Phone: [phone]

## 2015-04-11 NOTE — Progress Notes (Signed)
Pt has a 12 beat run of VTACH. Pt vs stable no s/s of distress. Pt has hx of AFIB. Paged [facility] this am. Awaiting callback.

## 2015-04-11 NOTE — Progress Notes (Signed)
   Lorelee Market presented for a lexiscan cardiolite today.  No immediate complications.  Stress imaging pending.  Nicolasa Ducking, NP 04/11/2015, 10:54 AM

## 2015-04-11 NOTE — Progress Notes (Signed)
Echocardiogram 2D Echocardiogram with Definity has been performed.  Anthony Meadows 04/11/2015, 2:10 PM

## 2015-04-11 NOTE — Progress Notes (Addendum)
[facility]  Subjective  - No new changes in the right LE from yesterday.   Objective 103/86 70 97.9 F (36.6 C) (Oral) 20 95%  Intake/Output Summary (Last 24 hours) at 04/11/15 0752 Last data filed at 04/10/15 2346  Gross per 24 hour  Intake    240 ml  Output      0 ml  Net    240 ml    Active range of motion of right foot and ankle, states sensation is intact. No doppler signals on the right LE Heart 12 beat run of VTACh this am Lungs non labored breathing  Assessment/Planning: Severe claudication right leg related to occluded axillofemoral bypass. Pending cardiac work up plan for EKG and stress test Tentatively plan for thrombectomy and possible revision of his right axillary to femoral bypass on 04/12/2015. Understands this will be done urgently should he develop any worsening ischemia.    COLLINS, EMMA MAUREEN 04/11/2015 7:52 AM --  Laboratory Lab Results:  Recent Labs  04/10/15 1459 04/11/15 0454  WBC 12.5* 11.1*  HGB 17.3* 16.7  HCT 49.5 49.0  PLT 178 166   BMET  Recent Labs  04/10/15 1459  NA 138  K 3.7  CL 102  CO2 25  GLUCOSE 116*  BUN 23*  CREATININE 1.13  CALCIUM 8.7*    COAG Lab Results  Component Value Date   INR 1.16 04/10/2015   No results found for: PTT    I have examined the patient, reviewed and agree with above. Comfortable this morning. No resting pain. Right foot warm. Appreciate c cardiology's assistance in preoperative planning. Discussed with patient. Plan for bacteremia and revision of right axillary femoral bypass tomorrow unless there is evidence of critical coronary disease      Davison Ohms, MD 04/11/2015 8:37 AM  

## 2015-04-11 NOTE — Progress Notes (Addendum)
Nuclear stress test reviewed with Dr. Mayford Knife:  IMPRESSION: 1. Large fixed heterogeneous perfusion defect of the anterior, lateral and inferior walls including the apex. No definite reversible ischemia by pharmacologic stress. 2. Global hypokinesis with left ventricular chamber dilatation. 3. Left ventricular ejection fraction 20% 4. High-risk stress test findings*.  Patient is known to have EF of 20% remotely per [facility] note in 2009, but we do not have any recent echo to see if there has been any interim improvement in the last 7 years. Dr. Mayford Knife would prefer to have this info before clearing patient for surgery. Ward Givens NP will covering team A tomorrow and will be working on contacting the patient's cardiologist tomorrow AM to see if they have a more recent EF. Patient and son made aware. I also paged vascular team to let them know of our plan, and nurse in OR will pass this info along to the covering physician.  If LV function is stable, Dr. Mayford Knife feels he will be high risk based on LV dysfunction but there is no ischemia - will need to avoid volume overload in light of recent CHF exacerbation.   He follows with Dr. Rockne Menghini in [place].  Anthony Molyneux PA-C

## 2015-04-11 NOTE — Progress Notes (Signed)
ANTICOAGULATION CONSULT NOTE  Pharmacy Consult for heparin Indication: Peripheral vascular disease with occluded axillofemoral bypass  Allergies  Allergen Reactions  . Penicillins Swelling    Patient Measurements: Height: 5\' 6"  (167.6 cm) Weight: 147 lb 11.2 oz (66.996 kg) IBW/kg (Calculated) : 63.8 Heparin Dosing Weight: 67kg  Vital Signs: Temp: 97.8 F (36.6 C) (06/21 1424) Temp Source: Oral (06/21 1424) BP: 111/50 mmHg (06/21 1433) Pulse Rate: 87 (06/21 1424)  Labs:  Recent Labs  04/10/15 1459 04/11/15 0454 04/11/15 1258  HGB 17.3* 16.7  --   HCT 49.5 49.0  --   PLT 178 166  --   APTT 31  --   --   LABPROT 14.9  --   --   INR 1.16  --   --   HEPARINUNFRC  --  0.41 0.11*  CREATININE 1.13  --   --     Estimated Creatinine Clearance: 61.2 mL/min (by C-G formula based on Cr of 1.13).   Medical History: Past Medical History  Diagnosis Date  . CHF (congestive heart failure)   . Hypertension   . AICD (automatic cardioverter/defibrillator) present   . COPD (chronic obstructive pulmonary disease)   . Hypercholesterolemia   . Myocardial infarction 2002    "before heart OR"  . Coronary artery disease   . PVD (peripheral vascular disease)    Assessment: 64 year old gentleman with a complex past peripheral vascular and cardiac history. Patient reports that 5 days ago he had sudden onset of right leg claudication symptoms. Patient does not appear to be on anticoagulation prior to admit and INR was normal.  Heparin started 6/20 for peripheral vascular disease with occluded axillofemoral bypass. HL this am was at goal, this afternoon it is now low at 0.11. Plan for bypass tomorrow.  Goal of Therapy:  Heparin level 0.3-0.7 units/ml Monitor platelets by anticoagulation protocol: Yes   Plan:  Increase heparin infusion to 1200 units/hr Check anti-Xa level in 6 hours and daily while on heparin Continue to monitor H&H and platelets  Sheppard Coil PharmD.,  BCPS Clinical Pharmacist Pager [phone] 04/11/2015 3:54 PM

## 2015-04-11 NOTE — Progress Notes (Signed)
SUBJECTIVE:  No SOB or chest pain since admission  OBJECTIVE:   Vitals:   Filed Vitals:   04/10/15 2053 04/10/15 2100 04/10/15 2131 04/11/15 0514  BP: 127/61 116/69 108/56 103/86  Pulse: 83 66 76 70  Temp:   97.7 F (36.5 C) 97.9 F (36.6 C)  TempSrc:   Oral Oral  Resp: Height:      Weight:   147 lb 11.2 oz (66.996 kg)   SpO2: 97% 98%  95%   I&O's:   Intake/Output Summary (Last 24 hours) at 04/11/15 0848 Last data filed at 04/10/15 2346  Gross per 24 hour  Intake    240 ml  Output      0 ml  Net    240 ml   TELEMETRY: Reviewed telemetry pt in atrial fibrillation with CVR:     PHYSICAL EXAM General: Well developed, well nourished, in no acute distress Head: Eyes PERRLA, No xanthomas.   Normal cephalic and atramatic  Lungs:   Clear bilaterally to auscultation and percussion. Heart:   Irregularly irregular S1 S2 no murmurs Abdomen: Bowel sounds are positive, abdomen soft and non-tender without masses Extremities:   No clubbing, cyanosis or edema.   Neuro: Alert and oriented X 3. Psych:  Good affect, responds appropriately   LABS: Basic Metabolic Panel:  Recent Labs  16/10/96 1459  NA 138  K 3.7  CL 102  CO2 25  GLUCOSE 116*  BUN 23*  CREATININE 1.13  CALCIUM 8.7*   Liver Function Tests: No results for input(s): AST, ALT, ALKPHOS, BILITOT, PROT, ALBUMIN in the last 72 hours. No results for input(s): LIPASE, AMYLASE in the last 72 hours. CBC:  Recent Labs  04/10/15 1459 04/11/15 0454  WBC 12.5* 11.1*  NEUTROABS 7.1  --   HGB 17.3* 16.7  HCT 49.5 49.0  MCV 98.6 97.4  PLT 178 166   Cardiac Enzymes: No results for input(s): CKTOTAL, CKMB, CKMBINDEX, TROPONINI in the last 72 hours. BNP: Invalid input(s): POCBNP D-Dimer: No results for input(s): DDIMER in the last 72 hours. Hemoglobin A1C: No results for input(s): HGBA1C in the last 72 hours. Fasting Lipid Panel: No results for input(s): CHOL, HDL, LDLCALC, TRIG, CHOLHDL,  LDLDIRECT in the last 72 hours. Thyroid Function Tests: No results for input(s): TSH, T4TOTAL, T3FREE, THYROIDAB in the last 72 hours.  Invalid input(s): FREET3 Anemia Panel: No results for input(s): VITAMINB12, FOLATE, FERRITIN, TIBC, IRON, RETICCTPCT in the last 72 hours. Coag Panel:   Lab Results  Component Value Date   INR 1.16 04/10/2015    RADIOLOGY: Dg Chest 2 View  04/11/2015   CLINICAL DATA:  Preoperative chest radiograph.  Initial encounter.  EXAM: CHEST  2 VIEW  COMPARISON:  None.  FINDINGS: The lungs are well-aerated and clear. There is no evidence of focal opacification, pleural effusion or pneumothorax.  The heart is mildly enlarged. The patient is status post median sternotomy, with evidence of prior CABG. AICD is noted at the left chest wall, with a single lead ending at the right ventricle. No acute osseous abnormalities are seen.  IMPRESSION: No acute cardiopulmonary process seen.   Electronically Signed   By: Roanna Raider M.D.   On: 04/11/2015 01:26   Ct Angio Ao+bifem W/cm &/or Wo/cm  04/10/2015   CLINICAL DATA:  Right leg pain.  History of fem-pop bypass.  EXAM: CT ANGIOGRAPHY OF ABDOMINAL AORTA WITH ILIOFEMORAL RUNOFF  TECHNIQUE: Multidetector CT imaging of the abdomen, pelvis and lower extremities  was performed using the standard protocol during bolus administration of intravenous contrast. Multiplanar CT image reconstructions and MIPs were obtained to evaluate the vascular anatomy.  CONTRAST:  OMNIPAQUE IOHEXOL 350 MG/ML SOLN  COMPARISON:  None.  FINDINGS: Aorta: There is irregular mural thrombus in the suprarenal abdominal aorta at the level of the celiac trunk. There is irregular mural thrombus throughout the infrarenal abdominal aorta without aneurysm. The celiac trunk, SMA and IMA are patent. Bilateral renal arteries are patent.  Right Lower Extremity: There are occluded metallic stents involving the right common iliac artery. No significant flow in the right  common iliac artery or right external iliac artery. Distal reconstitution of the right internal iliac artery branches. There is a right axillary-femoral bypass graft which is occluded. There is reconstitution of flow at the right common femoral artery and likely through pelvic collaterals. The right profunda femoral arteries are patent. The native right SFA is severely stenosed and occludes in the proximal segment. Majority of the right SFA is stented and occluded. There is a patent right femoral-popliteal bypass graft. There appears to be a contrast-fluid layer within the right femoral-popliteal bypass probably related to the slow flow. However, there may be stenosis or thrombus involving the proximal and mid aspect of the graft. In particular, there is concern for focal stenosis on sequence 6, image 154. There appears to be proximal occlusion of the runoff vessels with questionable reconstitution of the posterior tibial artery and peroneal artery.  Left Lower Extremity: Left common iliac artery has diffuse disease without significant stenosis. There is stenosis at the origin of the left internal iliac artery. Diffuse plaque involving the left external iliac artery without a critical stenosis. The left common femoral artery is patent. The left profunda femoral arteries are patent. There is segmental disease and stenosis throughout the proximal left superficial femoral artery. There is occlusion of the mid left SFA with reconstitution of the left popliteal artery. There is critical stenosis of the left popliteal artery at the knee joint. Left popliteal artery artery is displaced medial due to the exophytic tibial bone lesion. There appears to be least a short-segment occlusion of the popliteal artery. Reconstitution of the anterior tibial artery and peroneal artery. Distal reconstitution of the posterior tibial artery. Flow across the ankle from the dorsalis pedis artery.  Nonvascular structures: Lung bases are  clear. There is a large amount of contrast refluxing into the hepatic veins. No gross abnormality to the liver, gallbladder or spleen. Normal appearance of the pancreas. There is a 1.9 cm nodule involving the left adrenal gland with Hounsfield units of 4. This is compatible with a left adrenal adenoma. Normal appearance of the right adrenal gland. Exophytic low-density cyst involving the right kidney measures 3.4 cm. There is at least 1 additional right renal cyst. Evidence for multiple left renal cysts. Largest left renal cyst appears to have peripheral wall calcification and measures up to 5.7 cm. This large left renal cyst also appears to have a small lobulated component. No significant lymphadenopathy in the abdomen or pelvis. There is a small amount of free fluid in the pelvis. No gross abnormality to the prostate or urinary bladder. There is mild stranding or edema along the left lower quadrant and left paracolic gutter region. No acute abnormality to the appendix. No gross abnormality to the small or large bowel.  There is an exophytic bony lesion along the posterior lateral aspect of the proximal tibia. This lesion has patchy areas of sclerosis and measures  roughly 4.6 x 4.9 x 3.8 cm. This lesion appears to be displacing and remodeling the proximal fibula with some bony fusion. This is likely a slow growing benign lesion such as a osteochondroma.  Review of the MIP images confirms the above findings.  IMPRESSION: Occluded right axillary-femoral bypass. The right common iliac artery and right external iliac artery are occluded. Reconstitution of the right lower extremity arteries through collateral flow.  Patent right femoral-popliteal bypass but there is concern for nonocclusive thrombus or stenosis within the graft.  Severe right runoff disease with minimal flow in the right calf arteries.  Left outflow disease demonstrated by occlusion of the mid and distal left SFA. Critical stenosis and occlusion of the  mid and distal left popliteal artery.  Extensive atherosclerotic disease in the abdominal aorta with irregular mural thrombus. Negative for an abdominal aortic aneurysm.  Small amount of free fluid in the pelvis with edema or stranding in the left lower quadrant of the abdomen. These findings are nonspecific.  Bilateral renal cysts. Dominant left renal cyst appears to be at least mildly complex. Consider further evaluation with ultrasound.  Unusual exophytic bony lesion involving the proximal left tibia which appears to be displacing and fusing with the proximal left fibula. 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   Electronically Signed   By: Richarda Overlie M.D.   On: 04/10/2015 17:48   ASSESSMENT/PLAN: 1.  Chronic Systolic CHF with NYHA class 3  recent within 1 week exacerbation.  BNP elevated but ppears euvolemic on exam today. Continue BB/ACE I/diuretic.  Will get 2D echo to assess LVF.  2.  CAD s/p CABG in 2012 - on for nuclear stress test today to assess for ischemia prior to thrombectomy.  He has not had an ischemic evaluation in some time and difficult to assess otherwise due to poor function status from claudication.  Continue ASA/BB  3.  s/p ICD (possibly medtronic) - will interrogate  4.  Afib (not on warfarin) - has not been on warfarin for ? Reasons.  No history of bleeding issues.  Continue IV Heparin for now and will need to be transitioned to coumadin or NOAC post surgery.    5.  HTN - BP controlled on BB/ACE I  6.  HLD - continue high dose statin  7. Tobacco use  8.  PVD with multiple stents in the past and bypass now with increased claudication and occluded right common iliac artery/superficial femoral artery and occluded right axillary to fem bypass for thrombectomy tomorrow     Quintella Reichert, MD  04/11/2015  8:48 AM

## 2015-04-12 ENCOUNTER — Encounter (HOSPITAL_COMMUNITY)
Admission: EM | Disposition: A | Discharge status: Home / Self Care | Payer: Self-pay | Source: Home / Self Care | Attending: Vascular Surgery

## 2015-04-12 ENCOUNTER — Inpatient Hospital Stay (HOSPITAL_COMMUNITY): Payer: Medicare Other | Admitting: Certified Registered Nurse Anesthetist

## 2015-04-12 ENCOUNTER — Encounter (HOSPITAL_COMMUNITY): Payer: Self-pay | Admitting: Certified Registered Nurse Anesthetist

## 2015-04-12 HISTORY — PX: THROMBECTOMY FEMORAL ARTERY: SHX6406

## 2015-04-12 HISTORY — PX: PATCH ANGIOPLASTY: SHX6230

## 2015-04-12 LAB — CBC
HEMATOCRIT: 44.4 % (ref 39.0–52.0)
Hemoglobin: 15 g/dL (ref 13.0–17.0)
MCH: 33 pg (ref 26.0–34.0)
MCHC: 33.8 g/dL (ref 30.0–36.0)
MCV: 97.6 fL (ref 78.0–100.0)
Platelets: 155 10*3/uL (ref 150–400)
RBC: 4.55 MIL/uL (ref 4.22–5.81)
RDW: 13.5 % (ref 11.5–15.5)
WBC: 10.8 10*3/uL — ABNORMAL HIGH (ref 4.0–10.5)

## 2015-04-12 LAB — HEPARIN LEVEL (UNFRACTIONATED)
Heparin Unfractionated: 0.15 IU/mL — ABNORMAL LOW (ref 0.30–0.70)
Heparin Unfractionated: <0.1 IU/mL — ABNORMAL LOW (ref 0.30–0.70)

## 2015-04-12 LAB — MRSA PCR SCREENING: MRSA by PCR: NEGATIVE

## 2015-04-12 SURGERY — THROMBECTOMY, ARTERY, FEMORAL
Anesthesia: General | Site: Groin | Laterality: Right

## 2015-04-12 MED ORDER — ARTIFICIAL TEARS OP OINT
TOPICAL_OINTMENT | OPHTHALMIC | Status: DC | PRN
  Administered 2015-04-12: 1 via OPHTHALMIC

## 2015-04-12 MED ORDER — SODIUM CHLORIDE 0.9 % IV SOLN
10.0000 mg | INTRAVENOUS | Status: DC | PRN
  Administered 2015-04-12: 80 ug/min via INTRAVENOUS

## 2015-04-12 MED ORDER — DOCUSATE SODIUM 100 MG PO CAPS
100.0000 mg | ORAL_CAPSULE | Freq: Every day | ORAL | Status: DC
Start: 2015-04-13 — End: 2015-04-14
  Administered 2015-04-13 – 2015-04-14 (×2): 100 mg via ORAL
  Filled 2015-04-12 (×2): qty 1

## 2015-04-12 MED ORDER — HYDROMORPHONE HCL 1 MG/ML IJ SOLN: 0.2500 mg | INTRAMUSCULAR | Status: DC | PRN

## 2015-04-12 MED ORDER — MAGNESIUM SULFATE 2 GM/50ML IV SOLN
2.0000 g | Freq: Every day | INTRAVENOUS | Status: DC | PRN
  Filled 2015-04-12: qty 50

## 2015-04-12 MED ORDER — PROPOFOL 10 MG/ML IV BOLUS
INTRAVENOUS | Status: AC
  Filled 2015-04-12: qty 20

## 2015-04-12 MED ORDER — MORPHINE SULFATE 2 MG/ML IJ SOLN: 2.0000 mg | INTRAMUSCULAR | Status: DC | PRN

## 2015-04-12 MED ORDER — ACETAMINOPHEN 650 MG RE SUPP: 325.0000 mg | RECTAL | Status: DC | PRN

## 2015-04-12 MED ORDER — VANCOMYCIN HCL IN DEXTROSE 1-5 GM/200ML-% IV SOLN
1000.0000 mg | Freq: Two times a day (BID) | INTRAVENOUS | Status: DC
  Filled 2015-04-12 (×2): qty 200

## 2015-04-12 MED ORDER — MIDAZOLAM HCL 5 MG/5ML IJ SOLN
INTRAMUSCULAR | Status: DC | PRN
  Administered 2015-04-12: 1 mg via INTRAVENOUS

## 2015-04-12 MED ORDER — SODIUM CHLORIDE 0.9 % IV SOLN: 500.0000 mL | Freq: Once | INTRAVENOUS | Status: AC | PRN

## 2015-04-12 MED ORDER — PROPOFOL 10 MG/ML IV BOLUS
INTRAVENOUS | Status: DC | PRN
  Administered 2015-04-12: 80 mg via INTRAVENOUS

## 2015-04-12 MED ORDER — FENTANYL CITRATE (PF) 100 MCG/2ML IJ SOLN
INTRAMUSCULAR | Status: DC | PRN
  Administered 2015-04-12: 25 ug via INTRAVENOUS
  Administered 2015-04-12: 100 ug via INTRAVENOUS

## 2015-04-12 MED ORDER — MIDAZOLAM HCL 2 MG/2ML IJ SOLN
INTRAMUSCULAR | Status: AC
  Filled 2015-04-12: qty 2

## 2015-04-12 MED ORDER — POTASSIUM CHLORIDE CRYS ER 20 MEQ PO TBCR: 20.0000 meq | EXTENDED_RELEASE_TABLET | Freq: Every day | ORAL | Status: DC | PRN

## 2015-04-12 MED ORDER — GLYCOPYRROLATE 0.2 MG/ML IJ SOLN
INTRAMUSCULAR | Status: AC
  Filled 2015-04-12: qty 2

## 2015-04-12 MED ORDER — 0.9 % SODIUM CHLORIDE (POUR BTL) OPTIME
TOPICAL | Status: DC | PRN
  Administered 2015-04-12: 1000 mL

## 2015-04-12 MED ORDER — SODIUM CHLORIDE 0.9 % IV SOLN
INTRAVENOUS | Status: DC
  Administered 2015-04-12: 15:00:00 via INTRAVENOUS

## 2015-04-12 MED ORDER — LACTATED RINGERS IV SOLN
INTRAVENOUS | Status: DC | PRN
  Administered 2015-04-12: 11:00:00 via INTRAVENOUS

## 2015-04-12 MED ORDER — ONDANSETRON HCL 4 MG/2ML IJ SOLN
INTRAMUSCULAR | Status: AC
  Filled 2015-04-12: qty 2

## 2015-04-12 MED ORDER — NEOSTIGMINE METHYLSULFATE 10 MG/10ML IV SOLN
INTRAVENOUS | Status: AC
  Filled 2015-04-12: qty 1

## 2015-04-12 MED ORDER — FENTANYL CITRATE (PF) 250 MCG/5ML IJ SOLN
INTRAMUSCULAR | Status: AC
  Filled 2015-04-12: qty 5

## 2015-04-12 MED ORDER — PROTAMINE SULFATE 10 MG/ML IV SOLN
INTRAVENOUS | Status: AC
  Filled 2015-04-12: qty 5

## 2015-04-12 MED ORDER — GLYCOPYRROLATE 0.2 MG/ML IJ SOLN
INTRAMUSCULAR | Status: DC | PRN
  Administered 2015-04-12: .4 mg via INTRAVENOUS

## 2015-04-12 MED ORDER — SENNOSIDES-DOCUSATE SODIUM 8.6-50 MG PO TABS
1.0000 | ORAL_TABLET | Freq: Every evening | ORAL | Status: DC | PRN
  Filled 2015-04-12: qty 1

## 2015-04-12 MED ORDER — LIDOCAINE HCL (CARDIAC) 20 MG/ML IV SOLN
INTRAVENOUS | Status: DC | PRN
  Administered 2015-04-12: 60 mg via INTRATRACHEAL

## 2015-04-12 MED ORDER — ONDANSETRON HCL 4 MG/2ML IJ SOLN
INTRAMUSCULAR | Status: DC | PRN
  Administered 2015-04-12: 4 mg via INTRAVENOUS

## 2015-04-12 MED ORDER — HEPARIN SODIUM (PORCINE) 1000 UNIT/ML IJ SOLN
INTRAMUSCULAR | Status: DC | PRN
  Administered 2015-04-12: 7000 [IU] via INTRAVENOUS

## 2015-04-12 MED ORDER — EPHEDRINE SULFATE 50 MG/ML IJ SOLN
INTRAMUSCULAR | Status: DC | PRN
  Administered 2015-04-12: 5 mg via INTRAVENOUS
  Administered 2015-04-12 (×3): 10 mg via INTRAVENOUS

## 2015-04-12 MED ORDER — LIDOCAINE HCL (CARDIAC) 20 MG/ML IV SOLN
INTRAVENOUS | Status: AC
  Filled 2015-04-12: qty 5

## 2015-04-12 MED ORDER — ACETAMINOPHEN 325 MG PO TABS
325.0000 mg | ORAL_TABLET | ORAL | Status: DC | PRN
  Administered 2015-04-12: 650 mg via ORAL
  Filled 2015-04-12: qty 2

## 2015-04-12 MED ORDER — NEOSTIGMINE METHYLSULFATE 10 MG/10ML IV SOLN
INTRAVENOUS | Status: DC | PRN
  Administered 2015-04-12: 3 mg via INTRAVENOUS

## 2015-04-12 MED ORDER — VANCOMYCIN HCL 1000 MG IV SOLR
1000.0000 mg | Freq: Two times a day (BID) | INTRAVENOUS | Status: DC
  Administered 2015-04-12: 1000 mg via INTRAVENOUS
  Filled 2015-04-12 (×2): qty 1000

## 2015-04-12 MED ORDER — VANCOMYCIN HCL IN DEXTROSE 1-5 GM/200ML-% IV SOLN
INTRAVENOUS | Status: AC
  Filled 2015-04-12: qty 200

## 2015-04-12 MED ORDER — VANCOMYCIN HCL IN DEXTROSE 750-5 MG/150ML-% IV SOLN
750.0000 mg | Freq: Two times a day (BID) | INTRAVENOUS | Status: AC
  Administered 2015-04-13: 750 mg via INTRAVENOUS
  Filled 2015-04-12 (×3): qty 150

## 2015-04-12 MED ORDER — BISACODYL 10 MG RE SUPP: 10.0000 mg | Freq: Every day | RECTAL | Status: DC | PRN

## 2015-04-12 MED ORDER — PROTAMINE SULFATE 10 MG/ML IV SOLN
INTRAVENOUS | Status: DC | PRN
  Administered 2015-04-12: 40 mg via INTRAVENOUS
  Administered 2015-04-12: 10 mg via INTRAVENOUS

## 2015-04-12 MED ORDER — SODIUM CHLORIDE 0.9 % IR SOLN
Status: DC | PRN
  Administered 2015-04-12: 12:00:00

## 2015-04-12 MED ORDER — HEPARIN (PORCINE) IN NACL 100-0.45 UNIT/ML-% IJ SOLN
1400.0000 [IU]/h | INTRAMUSCULAR | Status: DC
  Filled 2015-04-12: qty 250

## 2015-04-12 MED ORDER — ROCURONIUM BROMIDE 100 MG/10ML IV SOLN
INTRAVENOUS | Status: DC | PRN
  Administered 2015-04-12: 50 mg via INTRAVENOUS

## 2015-04-12 SURGICAL SUPPLY — 43 items
BENZOIN TINCTURE PRP APPL 2/3 (GAUZE/BANDAGES/DRESSINGS) ×3 IMPLANT
CANISTER SUCTION 2500CC (MISCELLANEOUS) ×3 IMPLANT
CANNULA VESSEL 3MM 2 BLNT TIP (CANNULA) ×6 IMPLANT
CATH EMB 4FR 80CM (CATHETERS) ×3 IMPLANT
CLIP LIGATING EXTRA MED SLVR (CLIP) ×3 IMPLANT
CLIP LIGATING EXTRA SM BLUE (MISCELLANEOUS) ×3 IMPLANT
CLSR STERI-STRIP ANTIMIC 1/2X4 (GAUZE/BANDAGES/DRESSINGS) ×3 IMPLANT
DRAIN SNY 10X20 3/4 PERF (WOUND CARE) IMPLANT
DRAPE INCISE IOBAN 66X45 STRL (DRAPES) ×3 IMPLANT
DRAPE X-RAY CASS 24X20 (DRAPES) IMPLANT
DRSG COVADERM 4X10 (GAUZE/BANDAGES/DRESSINGS) IMPLANT
DRSG COVADERM 4X6 (GAUZE/BANDAGES/DRESSINGS) ×3 IMPLANT
DRSG COVADERM 4X8 (GAUZE/BANDAGES/DRESSINGS) IMPLANT
ELECT REM PT RETURN 9FT ADLT (ELECTROSURGICAL) ×3
ELECTRODE REM PT RTRN 9FT ADLT (ELECTROSURGICAL) ×2 IMPLANT
EVACUATOR SILICONE 100CC (DRAIN) IMPLANT
GAUZE SPONGE 4X4 12PLY STRL (GAUZE/BANDAGES/DRESSINGS) ×3 IMPLANT
GLOVE SS BIOGEL STRL SZ 7.5 (GLOVE) ×4 IMPLANT
GLOVE SUPERSENSE BIOGEL SZ 7.5 (GLOVE) ×2
GOWN STRL REUS W/ TWL LRG LVL3 (GOWN DISPOSABLE) ×10 IMPLANT
GOWN STRL REUS W/TWL LRG LVL3 (GOWN DISPOSABLE) ×5
INSERT FOGARTY SM (MISCELLANEOUS) ×3 IMPLANT
KIT BASIN OR (CUSTOM PROCEDURE TRAY) ×3 IMPLANT
KIT ROOM TURNOVER OR (KITS) ×3 IMPLANT
NS IRRIG 1000ML POUR BTL (IV SOLUTION) ×6 IMPLANT
PACK PERIPHERAL VASCULAR (CUSTOM PROCEDURE TRAY) ×3 IMPLANT
PAD ARMBOARD 7.5X6 YLW CONV (MISCELLANEOUS) ×6 IMPLANT
PATCH HEMASHIELD 8X75 (Vascular Products) ×3 IMPLANT
SET COLLECT BLD 21X3/4 12 (NEEDLE) IMPLANT
STAPLER VISISTAT 35W (STAPLE) IMPLANT
STOPCOCK 4 WAY LG BORE MALE ST (IV SETS) IMPLANT
STRIP CLOSURE SKIN 1/2X4 (GAUZE/BANDAGES/DRESSINGS) ×3 IMPLANT
SUT PROLENE 5 0 CC 1 (SUTURE) ×3 IMPLANT
SUT PROLENE 6 0 CC (SUTURE) ×6 IMPLANT
SUT SILK 2 0 FS (SUTURE) IMPLANT
SUT SILK 2 0 SH (SUTURE) IMPLANT
SUT VIC AB 2-0 CTX 36 (SUTURE) ×6 IMPLANT
SUT VIC AB 3-0 SH 27 (SUTURE) ×2
SUT VIC AB 3-0 SH 27X BRD (SUTURE) ×4 IMPLANT
SYR 3ML LL SCALE MARK (SYRINGE) ×3 IMPLANT
TRAY FOLEY W/METER SILVER 16FR (SET/KITS/TRAYS/PACK) ×3 IMPLANT
TUBING EXTENTION W/L.L. (IV SETS) IMPLANT
WATER STERILE IRR 1000ML POUR (IV SOLUTION) ×3 IMPLANT

## 2015-04-12 NOTE — Progress Notes (Signed)
ANTICOAGULATION CONSULT NOTE - Follow Up Consult  Pharmacy Consult for Heparin  Indication: PVD, occluded by-pass  Allergies  Allergen Reactions  . Penicillins Swelling    Patient Measurements: Height: 5\' 6"  (167.6 cm) Weight: 147 lb 11.2 oz (66.996 kg) IBW/kg (Calculated) : 63.8  Vital Signs: Temp: 98 F (36.7 C) (06/21 2017) Temp Source: Oral (06/21 2017) BP: 98/54 mmHg (06/21 2017) Pulse Rate: 71 (06/21 2017)  Labs:  Recent Labs  04/10/15 1459 04/11/15 0454 04/11/15 1258 04/12/15 0130  HGB 17.3* 16.7  --  15.0  HCT 49.5 49.0  --  44.4  PLT 178 166  --  155  APTT 31  --   --   --   LABPROT 14.9  --   --   --   INR 1.16  --   --   --   HEPARINUNFRC  --  0.41 0.11* 0.15*  CREATININE 1.13  --   --   --     Estimated Creatinine Clearance: 61.2 mL/min (by C-G formula based on Cr of 1.13).  Assessment: Sub-therapeutic heparin level despite rate increase, no issues per RN.   Goal of Therapy:  Heparin level 0.3-0.7 units/ml Monitor platelets by anticoagulation protocol: Yes   Plan:  -Increase heparin to 1400 units/hr -1100 HL -Daily CBC/HL -Monitor for bleeding -Possible OR today  Abran Duke 04/12/2015,2:38 AM

## 2015-04-12 NOTE — Progress Notes (Addendum)
SUBJECTIVE:  No complaints of chest pain or SOB  OBJECTIVE:   Vitals:   Filed Vitals:   04/11/15 1433 04/11/15 1649 04/11/15 2017 04/12/15 0457  BP: 111/50 106/67 98/54 103/40  Pulse:  73 71 52  Temp:   98 F (36.7 C) 97.9 F (36.6 C)  TempSrc:   Oral Oral  Resp:   20 18  Height:      Weight:    147 lb 14.4 oz (67.087 kg)  SpO2:   97% 96%   I&O's:   Intake/Output Summary (Last 24 hours) at 04/12/15 0857 Last data filed at 04/11/15 1650  Gross per 24 hour  Intake    480 ml  Output      0 ml  Net    480 ml   TELEMETRY: Reviewed telemetry pt in atrial fibrillation with occasiona V paced rhythm     PHYSICAL EXAM General: Well developed, well nourished, in no acute distress Head: Eyes PERRLA, No xanthomas.   Normal cephalic and atramatic  Lungs:   Clear bilaterally to auscultation and percussion. Heart:   HRRR S1 S2 Pulses are 2+ & equal. Abdomen: Bowel sounds are positive, abdomen soft and non-tender without masses  Extremities:   No clubbing, cyanosis or edema.  DP +1 Neuro: Alert and oriented X 3. Psych:  Good affect, responds appropriately   LABS: Basic Metabolic Panel:  Recent Labs  54/00/86 1459  NA 138  K 3.7  CL 102  CO2 25  GLUCOSE 116*  BUN 23*  CREATININE 1.13  CALCIUM 8.7*   Liver Function Tests: No results for input(s): AST, ALT, ALKPHOS, BILITOT, PROT, ALBUMIN in the last 72 hours. No results for input(s): LIPASE, AMYLASE in the last 72 hours. CBC:  Recent Labs  04/10/15 1459 04/11/15 0454 04/12/15 0130  WBC 12.5* 11.1* 10.8*  NEUTROABS 7.1  --   --   HGB 17.3* 16.7 15.0  HCT 49.5 49.0 44.4  MCV 98.6 97.4 97.6  PLT 178 166 155   Cardiac Enzymes: No results for input(s): CKTOTAL, CKMB, CKMBINDEX, TROPONINI in the last 72 hours. BNP: Invalid input(s): POCBNP D-Dimer: No results for input(s): DDIMER in the last 72 hours. Hemoglobin A1C: No results for input(s): HGBA1C in the last 72 hours. Fasting Lipid Panel: No results for  input(s): CHOL, HDL, LDLCALC, TRIG, CHOLHDL, LDLDIRECT in the last 72 hours. Thyroid Function Tests: No results for input(s): TSH, T4TOTAL, T3FREE, THYROIDAB in the last 72 hours.  Invalid input(s): FREET3 Anemia Panel: No results for input(s): VITAMINB12, FOLATE, FERRITIN, TIBC, IRON, RETICCTPCT in the last 72 hours. Coag Panel:   Lab Results  Component Value Date   INR 1.16 04/10/2015    RADIOLOGY: Dg Chest 2 View  04/11/2015   CLINICAL DATA:  Preoperative chest radiograph.  Initial encounter.  EXAM: CHEST  2 VIEW  COMPARISON:  None.  FINDINGS: The lungs are well-aerated and clear. There is no evidence of focal opacification, pleural effusion or pneumothorax.  The heart is mildly enlarged. The patient is status post median sternotomy, with evidence of prior CABG. AICD is noted at the left chest wall, with a single lead ending at the right ventricle. No acute osseous abnormalities are seen.  IMPRESSION: No acute cardiopulmonary process seen.   Electronically Signed   By: Roanna Raider M.D.   On: 04/11/2015 01:26   Ct Angio Ao+bifem W/cm &/or Wo/cm  04/10/2015   CLINICAL DATA:  Right leg pain.  History of fem-pop bypass.  EXAM: CT ANGIOGRAPHY OF  ABDOMINAL AORTA WITH ILIOFEMORAL RUNOFF  TECHNIQUE: Multidetector CT imaging of the abdomen, pelvis and lower extremities was performed using the standard protocol during bolus administration of intravenous contrast. Multiplanar CT image reconstructions and MIPs were obtained to evaluate the vascular anatomy.  CONTRAST:  OMNIPAQUE IOHEXOL 350 MG/ML SOLN  COMPARISON:  None.  FINDINGS: Aorta: There is irregular mural thrombus in the suprarenal abdominal aorta at the level of the celiac trunk. There is irregular mural thrombus throughout the infrarenal abdominal aorta without aneurysm. The celiac trunk, SMA and IMA are patent. Bilateral renal arteries are patent.  Right Lower Extremity: There are occluded metallic stents involving the right common iliac  artery. No significant flow in the right common iliac artery or right external iliac artery. Distal reconstitution of the right internal iliac artery branches. There is a right axillary-femoral bypass graft which is occluded. There is reconstitution of flow at the right common femoral artery and likely through pelvic collaterals. The right profunda femoral arteries are patent. The native right SFA is severely stenosed and occludes in the proximal segment. Majority of the right SFA is stented and occluded. There is a patent right femoral-popliteal bypass graft. There appears to be a contrast-fluid layer within the right femoral-popliteal bypass probably related to the slow flow. However, there may be stenosis or thrombus involving the proximal and mid aspect of the graft. In particular, there is concern for focal stenosis on sequence 6, image 154. There appears to be proximal occlusion of the runoff vessels with questionable reconstitution of the posterior tibial artery and peroneal artery.  Left Lower Extremity: Left common iliac artery has diffuse disease without significant stenosis. There is stenosis at the origin of the left internal iliac artery. Diffuse plaque involving the left external iliac artery without a critical stenosis. The left common femoral artery is patent. The left profunda femoral arteries are patent. There is segmental disease and stenosis throughout the proximal left superficial femoral artery. There is occlusion of the mid left SFA with reconstitution of the left popliteal artery. There is critical stenosis of the left popliteal artery at the knee joint. Left popliteal artery artery is displaced medial due to the exophytic tibial bone lesion. There appears to be least a short-segment occlusion of the popliteal artery. Reconstitution of the anterior tibial artery and peroneal artery. Distal reconstitution of the posterior tibial artery. Flow across the ankle from the dorsalis pedis artery.   Nonvascular structures: Lung bases are clear. There is a large amount of contrast refluxing into the hepatic veins. No gross abnormality to the liver, gallbladder or spleen. Normal appearance of the pancreas. There is a 1.9 cm nodule involving the left adrenal gland with Hounsfield units of 4. This is compatible with a left adrenal adenoma. Normal appearance of the right adrenal gland. Exophytic low-density cyst involving the right kidney measures 3.4 cm. There is at least 1 additional right renal cyst. Evidence for multiple left renal cysts. Largest left renal cyst appears to have peripheral wall calcification and measures up to 5.7 cm. This large left renal cyst also appears to have a small lobulated component. No significant lymphadenopathy in the abdomen or pelvis. There is a small amount of free fluid in the pelvis. No gross abnormality to the prostate or urinary bladder. There is mild stranding or edema along the left lower quadrant and left paracolic gutter region. No acute abnormality to the appendix. No gross abnormality to the small or large bowel.  There is an exophytic bony lesion along  the posterior lateral aspect of the proximal tibia. This lesion has patchy areas of sclerosis and measures roughly 4.6 x 4.9 x 3.8 cm. This lesion appears to be displacing and remodeling the proximal fibula with some bony fusion. This is likely a slow growing benign lesion such as a osteochondroma.  Review of the MIP images confirms the above findings.  IMPRESSION: Occluded right axillary-femoral bypass. The right common iliac artery and right external iliac artery are occluded. Reconstitution of the right lower extremity arteries through collateral flow.  Patent right femoral-popliteal bypass but there is concern for nonocclusive thrombus or stenosis within the graft.  Severe right runoff disease with minimal flow in the right calf arteries.  Left outflow disease demonstrated by occlusion of the mid and distal left SFA.  Critical stenosis and occlusion of the mid and distal left popliteal artery.  Extensive atherosclerotic disease in the abdominal aorta with irregular mural thrombus. Negative for an abdominal aortic aneurysm.  Small amount of free fluid in the pelvis with edema or stranding in the left lower quadrant of the abdomen. These findings are nonspecific.  Bilateral renal cysts. Dominant left renal cyst appears to be at least mildly complex. Consider further evaluation with ultrasound.  Unusual exophytic bony lesion involving the proximal left tibia which appears to be displacing and fusing with the proximal left fibula. 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   Electronically Signed   By: Richarda Overlie M.D.   On: 04/10/2015 17:48   Nm Myocar Multi W/spect W/wall Motion / Ef  04/11/2015   CLINICAL DATA:  Preoperative evaluation, history of CHF, AICD, hypertension  EXAM: MYOCARDIAL IMAGING WITH SPECT (REST AND PHARMACOLOGIC-STRESS)  GATED LEFT VENTRICULAR WALL MOTION STUDY  LEFT VENTRICULAR EJECTION FRACTION  TECHNIQUE: Standard myocardial SPECT imaging was performed after resting intravenous injection of 10 mCi Tc-60m sestamibi. Subsequently, intravenous infusion of Lexiscan was performed under the supervision of the Cardiology staff. At peak effect of the drug, 30 mCi Tc-27m sestamibi was injected intravenously and standard myocardial SPECT imaging was performed. Quantitative gated imaging was also performed to evaluate left ventricular wall motion, and estimate left ventricular ejection fraction.  COMPARISON:  None.  FINDINGS: Perfusion: Fixed large heterogeneous perfusion defect of the anterior, lateral, and inferior walls. This also involves the apex. Relatively preserved perfusion to the septum. No definite inducible or reversible ischemia by pharmacologic stress.  Wall Motion: Diffuse global hypokinesis with left ventricular chamber dilatation  Left Ventricular Ejection Fraction: 20 %  End diastolic volume  161 ml  End systolic volume 173 ml  IMPRESSION: 1. Large fixed heterogeneous perfusion defect of the anterior, lateral and inferior walls including the apex. No definite reversible ischemia by pharmacologic stress.  2. Global hypokinesis with left ventricular chamber dilatation.  3. Left ventricular ejection fraction 20%  4. High-risk stress test findings*.  *2012 Appropriate Use Criteria for Coronary Revascularization Focused Update: J Am Coll Cardiol. 2012;59(9):857-881. http://content.dementiazones.com.aspx?articleid=1201161   Electronically Signed   By: Judie Petit.  Shick M.D.   On: 04/11/2015 15:48   ASSESSMENT/PLAN: 1. Chronic Systolic CHF with NYHA class 3  recent within 1 week exacerbation. BNP elevated on admission but appears euvolemic on exam today.  This may be where his BNP is chronically.  Continue BB/ACE I/diuretic.  2D echo with severely reduced LVF EF 20-25% with moderate MR.  No pulmonary HTN.  His wife says that his EF has been 20% since his AICD.  I received records from cardiologist in [place] - EF 25-30% by echo 07/2013  2. CAD s/p CABG in 2012 -  Nuclear stress test showed Large fixed heterogeneous perfusion defect of the anterior,lateral and inferior walls including the apex. No definite reversible ischemia by pharmacologic stress and EF 20%.  Continue ASA/BB.  He has no ischemia on myoview.  His main risk to surgery is volume overload and acute exacerbation of CHF especially in light of recent exacerbation.  Will need to be cautious with IVF in the intraop and peri-op period.  His lungs are clear on exam today and he has no LE edema.    3. s/p ICD (possibly medtronic)   4. Afib (not on warfarin) - has not been on warfarin for ? Reasons. No history of bleeding issues. Continue IV Heparin for now and will need to be transitioned to coumadin or NOAC post surgery.   5. HTN - BP controlled and soft on BB/ACE I  6. HLD - continue high dose statin  7. Tobacco use -  encouraged cessation  8. PVD with multiple stents in the past and bypass now with increased claudication and occluded right common iliac artery/superficial femoral artery and occluded right axillary to fem bypass for thrombectomy today.         Quintella Reichert, MD  04/12/2015  8:57 AM

## 2015-04-12 NOTE — Transfer of Care (Signed)
Immediate Anesthesia Transfer of Care Note  Patient: Anthony Meadows  Procedure(s) Performed: Procedure(s): THROMBECTOMY Right Axilla-Femoral Gortex Graft (Right) PATCH ANGIOPLASTY Right Femoral Artery (Right)  Patient Location: PACU  Anesthesia Type:General  Level of Consciousness: awake, alert , oriented and patient cooperative  Airway & Oxygen Therapy: Patient Spontanous Breathing and Patient connected to face mask oxygen  Post-op Assessment: Report given to RN, Post -op Vital signs reviewed and stable and Patient moving all extremities  Post vital signs: Reviewed and stable  Last Vitals:  Filed Vitals:   04/12/15 0457  BP: 103/40  Pulse: 52  Temp: 36.6 C  Resp: 18    Complications: No apparent anesthesia complications

## 2015-04-12 NOTE — Anesthesia Preprocedure Evaluation (Addendum)
Anesthesia Evaluation  Patient identified by MRN, date of birth, ID band Patient awake    Reviewed: Allergy & Precautions, H&P , NPO status , Patient's Chart, lab work & pertinent test results, reviewed documented beta blocker date and time   Airway Mallampati: II  TM Distance: >3 FB Neck ROM: Full    Dental no notable dental hx. (+) Teeth Intact, Dental Advisory Given   Pulmonary COPDCurrent Smoker,  breath sounds clear to auscultation  Pulmonary exam normal       Cardiovascular hypertension, Pt. on medications and Pt. on home beta blockers + CAD, + Past MI, + CABG, + Peripheral Vascular Disease and +CHF + Cardiac Defibrillator Rhythm:Irregular Rate:Normal     Neuro/Psych negative neurological ROS  negative psych ROS   GI/Hepatic negative GI ROS, Neg liver ROS,   Endo/Other  negative endocrine ROS  Renal/GU negative Renal ROS  negative genitourinary   Musculoskeletal   Abdominal   Peds  Hematology negative hematology ROS (+)   Anesthesia Other Findings   Reproductive/Obstetrics negative OB ROS                            Anesthesia Physical Anesthesia Plan  ASA: IV  Anesthesia Plan: General   Post-op Pain Management:    Induction: Intravenous  Airway Management Planned: Oral ETT  Additional Equipment: Arterial line  Intra-op Plan:   Post-operative Plan: Extubation in OR and Possible Post-op intubation/ventilation  Informed Consent: I have reviewed the patients History and Physical, chart, labs and discussed the procedure including the risks, benefits and alternatives for the proposed anesthesia with the patient or authorized representative who has indicated his/her understanding and acceptance.   Dental advisory given  Plan Discussed with: CRNA  Anesthesia Plan Comments:         Anesthesia Quick Evaluation

## 2015-04-12 NOTE — Progress Notes (Signed)
Notified by central monitoring patient experienced a 14 beat run of v-tach, confirmed via saved strip.  Paged PA Rhyne awaiting call back.  Will continue to monitor patient.

## 2015-04-12 NOTE — Progress Notes (Signed)
Dr. Arbie Cookey at bedside to check pulses in legs

## 2015-04-12 NOTE — Progress Notes (Addendum)
ANTICOAGULATION CONSULT NOTE - Follow Up Consult  Pharmacy Consult:  Heparin Indication:  Occluded bypass graft, PVD  Allergies  Allergen Reactions  . Penicillins Swelling    Patient Measurements: Height: 5\' 6"  (167.6 cm) Weight: 147 lb 14.4 oz (67.087 kg) IBW/kg (Calculated) : 63.8 Heparin Dosing Weight: 67 kg  Vital Signs: Temp: 97.5 F (36.4 C) (06/22 1415) Temp Source: Oral (06/22 0457) BP: 125/94 mmHg (06/22 1330) Pulse Rate: 51 (06/22 1415)  Labs:  Recent Labs  04/10/15 1459  04/11/15 0454 04/11/15 1258 04/12/15 0130 04/12/15 1347  HGB 17.3*  --  16.7  --  15.0  --   HCT 49.5  --  49.0  --  44.4  --   PLT 178  --  166  --  155  --   APTT 31  --   --   --   --   --   LABPROT 14.9  --   --   --   --   --   INR 1.16  --   --   --   --   --   HEPARINUNFRC  --   < > 0.41 0.11* 0.15* <0.10*  CREATININE 1.13  --   --   --   --   --   < > = values in this interval not displayed.  Estimated Creatinine Clearance: 61.2 mL/min (by C-G formula based on Cr of 1.13).     Assessment: 83 YOM with occluded bypass s/p thrombectomy to resume IV heparin tomorrow at 0800 per documentation.   Goal of Therapy:  Heparin level 0.3-0.7 units/ml Monitor platelets by anticoagulation protocol: Yes  Vanc trough 10-15 mcg/mL    Plan:  - On 04/13/15 at 0800, resume heparin gtt at 1400 units/hr - Check 6 hr HL post heparin resumption - Daily HL / CBC - F/U d/c prednisone (supposed to be a 5 days course) - Change vanc to 750mg  IV Q12H - Monitor renal fxn, clinical progress, vanc trough as indicated    Pascal Stiggers D. Laney Potash, PharmD, BCPS Pager:  [phone] 04/12/2015, 2:59 PM

## 2015-04-12 NOTE — Op Note (Signed)
    OPERATIVE REPORT  DATE OF SURGERY: 04/12/2015  PATIENT: Anthony Meadows, 63 y.o. male MRN: [id number]  DOB: [birth year]  PRE-OPERATIVE DIAGNOSIS: Right foot ischemia with occluded right axillofemoral bypass  POST-OPERATIVE DIAGNOSIS:  Same  PROCEDURE: Thrombectomy of right axillofemoral bypass with exploration of right femoral artery and Dacron patch angioplasty of right femoral anastomosis  SURGEON:  Gretta Began, M.D.  PHYSICIAN ASSISTANT: Dr. Betti Cruz, Doreatha Massed PA-C  ANESTHESIA:  Gen.  EBL: 100 ml  Total I/O In: 600 [I.V.:600] Out: -   BLOOD ADMINISTERED: None  DRAINS: None  SPECIMEN: None  COUNTS CORRECT:  YES  PLAN OF CARE: PACU   PATIENT DISPOSITION:  PACU - hemodynamically stable  PROCEDURE DETAILS: The patient was taken to the operative placed supine position where the area of the right axillary region right chest right groin right leg were prepped and draped in usual sterile fashion. The patient had a prior right axillofemoral and right femoropopliteal bypass although an outlying institutions years ago. He presented with a five-day history of claudication and CT angiogram revealed occlusion of his axillofemoral bypass. He did maintain patency of his right femoral-popliteal bypass with the pulp profunda collaterals. Vision was made to the prior scar in the right groin and carried down to isolate the Gore-Tex graft axillofemoral bypass, his external iliac was also controlled up under the inguinal ligament. Superficial femoral and profundus femoris arteries were controlled as was the vein graft that arose from the hood of his axillofemoral bypass. This was patent. The patient was given 7000 units intravenous heparin and after adequate circulation time the foot of the graft was opened after controlling all the inflow and outflow vessels. This was done with 11 blade some ulcerative Potts scissors. There was clot at the anastomosis. This was removed under direct vision.  There was no clot in the femoral-popliteal vein graft. This was flushed with heparinized and reoccluded. There was good backbleeding from the profundus femoris artery. There was no inflow from the native external iliac artery. A 4 Fogarty catheter was used to thrombectomize the axillary to femoral bypass. This was thrombectomized quite easily with excellent arterial inflow. There was some neointimal on the inner lining of the graft and this was removed as well. After several negative patch passes. The axillofemoral bypass was flushed with heparinized saline and reoccluded. A message Hemashield Dacron patch was brought onto the field and was sewn as a patch angioplasty with a running 60 proline suture. Prior to completion of the anastomosis the usual flushing maneuvers were undertaken. Anastomosis completed and flow was reviewed. The patient had a easily palpable popliteal pulse. Patient was given 50 mg of protamine to reverse the heparin. The wound irrigated with saline. Hemostasis tablet cautery. The wounds were closed with 20 Vicryls in several layers of the fascia and subcutaneous tissue. The skin was closed with a 30 Vicryls subcuticular stitch. Sterile dressing was applied the patient was transferred to the recovery room in stable condition   Gretta Began, M.D. 04/12/2015 1:05 PM

## 2015-04-12 NOTE — Progress Notes (Signed)
Notified PA Haggar of patients fourteen beat run of v-tach.  Patient was asymptomatic during with VSS and no s/s of distress noted.  Received no new orders at this time.  Will continue to monitor.

## 2015-04-12 NOTE — Progress Notes (Signed)
  Day of Surgery Note    Subjective:  No complaints  Filed Vitals:   04/12/15 1345  BP:   Pulse: 81  Temp:   Resp: 21    Incisions:   Right groin with bandage with mild bloody drainage Extremities:  +doppler signal right PT and left DP Cardiac:  irregular Lungs:  Non labored   Assessment/Plan:  This is a 64 y.o. male who is s/p Thrombectomy of right axillofemoral bypass with exploration of right femoral artery and Dacron patch angioplasty of right femoral anastomosis  -pt doing well this afternoon -heparin gtt to be restarted in the morning at 8am -to 3 south when bed available   Doreatha Massed, PA-C 04/12/2015 2:16 PM

## 2015-04-12 NOTE — Progress Notes (Signed)
Pt transported off unit to OR for procedure; reported off to short stay RN; preop checklist completed; mrsa pcr sent per protocol; pt heparin stopped. Pt transported off unit via bed with family at side. Arabella Merles Afiya Ferrebee RN.

## 2015-04-12 NOTE — Plan of Care (Signed)
Problem: Phase I Progression Outcomes Goal: Other Phase I Outcomes/Goals Outcome: Completed/Met Date Met:  04/12/15 EF 20-25% PER 04/11/15 ECHO

## 2015-04-12 NOTE — H&P (View-Only) (Signed)
[facility]  Subjective  - No new changes in the right LE from yesterday.   Objective 103/86 70 97.9 F (36.6 C) (Oral) 20 95%  Intake/Output Summary (Last 24 hours) at 04/11/15 0752 Last data filed at 04/10/15 2346  Gross per 24 hour  Intake    240 ml  Output      0 ml  Net    240 ml    Active range of motion of right foot and ankle, states sensation is intact. No doppler signals on the right LE Heart 12 beat run of VTACh this am Lungs non labored breathing  Assessment/Planning: Severe claudication right leg related to occluded axillofemoral bypass. Pending cardiac work up plan for EKG and stress test Tentatively plan for thrombectomy and possible revision of his right axillary to femoral bypass on 04/12/2015. Understands this will be done urgently should he develop any worsening ischemia.    Clinton Gallant [facility] 04/11/2015 7:52 AM --  Laboratory Lab Results:  Recent Labs  04/10/15 1459 04/11/15 0454  WBC 12.5* 11.1*  HGB 17.3* 16.7  HCT 49.5 49.0  PLT 178 166   BMET  Recent Labs  04/10/15 1459  NA 138  K 3.7  CL 102  CO2 25  GLUCOSE 116*  BUN 23*  CREATININE 1.13  CALCIUM 8.7*    COAG Lab Results  Component Value Date   INR 1.16 04/10/2015   No results found for: PTT    I have examined the patient, reviewed and agree with above. Comfortable this morning. No resting pain. Right foot warm. Appreciate c cardiology's assistance in preoperative planning. Discussed with patient. Plan for bacteremia and revision of right axillary femoral bypass tomorrow unless there is evidence of critical coronary disease      Gretta Began, MD 04/11/2015 8:37 AM

## 2015-04-12 NOTE — Anesthesia Procedure Notes (Signed)
Procedure Name: Intubation Date/Time: 04/12/2015 10:57 AM Performed by: Bishop Limbo Pre-anesthesia Checklist: Patient identified, Timeout performed, Emergency Drugs available, Suction available and Patient being monitored Patient Re-evaluated:Patient Re-evaluated prior to inductionOxygen Delivery Method: Circle system utilized Preoxygenation: Pre-oxygenation with 100% oxygen Intubation Type: IV induction Ventilation: Two handed mask ventilation required and Oral airway inserted - appropriate to patient size Laryngoscope Size: Hyacinth Meeker and 2 Grade View: Grade II Tube type: Oral Number of attempts: 1 Airway Equipment and Method: Stylet Placement Confirmation: ETT inserted through vocal cords under direct vision,  positive ETCO2,  CO2 detector and breath sounds checked- equal and bilateral Secured at: 22 cm Tube secured with: Tape Dental Injury: Teeth and Oropharynx as per pre-operative assessment  Comments: 2 hand mask and oral airway. Patient has facial hair.

## 2015-04-12 NOTE — OR Nursing (Signed)
Dr. Arbie Cookey is at the bedside and can palpate a bounding popliteal pulse.  I am unable to dopple PT and DP pulses in pt's right foot.  Dr. Arbie Cookey is aware and said it should return when warmer.

## 2015-04-12 NOTE — Anesthesia Postprocedure Evaluation (Signed)
  Anesthesia Post-op Note  Patient: Anthony Meadows  Procedure(s) Performed: Procedure(s): THROMBECTOMY Right Axilla-Femoral Gortex Graft (Right) PATCH ANGIOPLASTY Right Femoral Artery (Right)  Patient Location: PACU  Anesthesia Type:General  Level of Consciousness: awake and alert   Airway and Oxygen Therapy: Patient Spontanous Breathing  Post-op Pain: none  Post-op Assessment: Post-op Vital signs reviewed, Patient's Cardiovascular Status Stable and Respiratory Function Stable  Post-op Vital Signs: Reviewed  Filed Vitals:   04/12/15 1345  BP:   Pulse: 81  Temp:   Resp: 21    Complications: No apparent anesthesia complications

## 2015-04-12 NOTE — Interval H&P Note (Signed)
History and Physical Interval Note:  04/12/2015 7:39 AM  Anthony Meadows  has presented today for surgery, with the diagnosis of Peripheral vascular disease with right lower extremity claudication I70.211  The various methods of treatment have been discussed with the patient and family. After consideration of risks, benefits and other options for treatment, the patient has consented to  Procedure(s): REDO BYPASS GRAFT AXILLA-FEMORAL (Right) as a surgical intervention .  The patient's history has been reviewed, patient examined, no change in status, stable for surgery.  I have reviewed the patient's chart and labs.  Questions were answered to the patient's satisfaction.     Gretta Began

## 2015-04-13 ENCOUNTER — Encounter (HOSPITAL_COMMUNITY): Payer: Self-pay | Admitting: Vascular Surgery

## 2015-04-13 ENCOUNTER — Ambulatory Visit (HOSPITAL_COMMUNITY): Payer: Medicare Other

## 2015-04-13 DIAGNOSIS — Z9889 Other specified postprocedural states: Secondary | ICD-10-CM

## 2015-04-13 DIAGNOSIS — I739 Peripheral vascular disease, unspecified: Secondary | ICD-10-CM

## 2015-04-13 LAB — BASIC METABOLIC PANEL
ANION GAP: 6 (ref 5–15)
BUN: 16 mg/dL (ref 6–20)
CO2: 25 mmol/L (ref 22–32)
CREATININE: 1.07 mg/dL (ref 0.61–1.24)
Calcium: 8.1 mg/dL — ABNORMAL LOW (ref 8.9–10.3)
Chloride: 106 mmol/L (ref 101–111)
GFR calc non Af Amer: >60 mL/min (ref >60)
Glucose, Bld: 101 mg/dL — ABNORMAL HIGH (ref 65–99)
POTASSIUM: 4.2 mmol/L (ref 3.5–5.1)
Sodium: 137 mmol/L (ref 135–145)

## 2015-04-13 LAB — CBC
HCT: 39.4 % (ref 39.0–52.0)
Hemoglobin: 13.2 g/dL (ref 13.0–17.0)
MCH: 33.2 pg (ref 26.0–34.0)
MCHC: 33.5 g/dL (ref 30.0–36.0)
MCV: 99 fL (ref 78.0–100.0)
Platelets: 125 10*3/uL — ABNORMAL LOW (ref 150–400)
RBC: 3.98 MIL/uL — ABNORMAL LOW (ref 4.22–5.81)
RDW: 13.5 % (ref 11.5–15.5)
WBC: 9.9 10*3/uL (ref 4.0–10.5)

## 2015-04-13 MED ORDER — CARVEDILOL 25 MG PO TABS
25.0000 mg | ORAL_TABLET | Freq: Two times a day (BID) | ORAL | Status: DC
  Administered 2015-04-13: 25 mg via ORAL
  Filled 2015-04-13 (×4): qty 1

## 2015-04-13 MED ORDER — HEPARIN (PORCINE) IN NACL 100-0.45 UNIT/ML-% IJ SOLN
1400.0000 [IU]/h | INTRAMUSCULAR | Status: DC
  Filled 2015-04-13: qty 250

## 2015-04-13 NOTE — Care Management Note (Signed)
Case Management Note  Patient Details  Name: Anthony Meadows MRN: [id number] Date of Birth: 02/19/1951  Subjective/Objective:                 From home with wife admitted s/p  thrombectomy of right axillofemoral bypass with exploration of right femoral artery.    Action/Plan:  Return to home when medically stable. CM to f/u with d/c needs. Expected Discharge Date:                  Expected Discharge Plan:  Home/Self Care  In-House Referral:     Discharge planning Services  CM Consult  Post Acute Care Choice:    Choice offered to:     DME Arranged:    DME Agency:     HH Arranged:    HH Agency:     Status of Service:  In process, will continue to follow  Medicare Important Message Given:  Yes Date Medicare IM Given:  04/13/15 Medicare IM give by:  Gae Gallop RN Date Additional Medicare IM Given:    Additional Medicare Important Message give by:     If discussed at Long Length of Stay Meetings, dates discussed:    Additional CommentsSaveon Barsamian) [phone] Epifanio Lesches, RN 04/13/2015, 10:54 AM

## 2015-04-13 NOTE — Progress Notes (Signed)
OT Cancellation Note  Patient Details Name: Anthony Meadows MRN: [id number] DOB: 12-02-1950   Cancelled Treatment:    Reason Eval/Treat Not Completed: OT screened, no needs identified, will sign off  Pt reports independence to modified independence for selfcare tasks.  No OT needs at this time.   [name] OTR/L 04/13/2015, 4:17 PM

## 2015-04-13 NOTE — Progress Notes (Signed)
VASCULAR LAB PRELIMINARY  ARTERIAL  ABI completed:    RIGHT    LEFT    PRESSURE WAVEFORM  PRESSURE WAVEFORM  BRACHIAL 120 Tri BRACHIAL 115 Tri  DP   DP    AT 72 Mono AT 81 Mono  PT 76 Mono PT  Not audible  PER   PER  Not audible  GREAT TOE  NA GREAT TOE  NA    RIGHT LEFT  ABI 0.63 0.68      Farrel Demark, RDMS, RVT  04/13/2015, 11:11 AM

## 2015-04-13 NOTE — Progress Notes (Signed)
Patient to transfer to 2W02 report given to receiving nurse Kathlene November all questions answered at this time.  Pt. VSS with no s/s of distress noted.  Patient stable at transfer.

## 2015-04-13 NOTE — Progress Notes (Addendum)
  Progress Note    04/13/2015 7:18 AM 1 Day Post-Op  Subjective:  C/o soreness in groin, otherwise, no complaints  Afebrile HR 60's-70's AFib 100's-120's systolic 100% 2LO2NC  Filed Vitals:   04/13/15 0409  BP: 102/61  Pulse: 49  Temp: 97.6 F (36.4 C)  Resp: 17    Physical Exam: Cardiac:  irregular Lungs:  Non labored Incisions:  Right groin steri strips bloody with hematoma Extremities:  +audible right PT; biphasic doppler signal within ax-fem graft   CBC    Component Value Date/Time   WBC 9.9 04/13/2015 0415   RBC 3.98* 04/13/2015 0415   HGB 13.2 04/13/2015 0415   HCT 39.4 04/13/2015 0415   PLT 125* 04/13/2015 0415   MCV 99.0 04/13/2015 0415   MCH 33.2 04/13/2015 0415   MCHC 33.5 04/13/2015 0415   RDW 13.5 04/13/2015 0415   LYMPHSABS 4.3* 04/10/2015 1459   MONOABS 1.0 04/10/2015 1459   EOSABS 0.1 04/10/2015 1459   BASOSABS 0.0 04/10/2015 1459    BMET    Component Value Date/Time   NA 137 04/13/2015 0415   K 4.2 04/13/2015 0415   CL 106 04/13/2015 0415   CO2 25 04/13/2015 0415   GLUCOSE 101* 04/13/2015 0415   BUN 16 04/13/2015 0415   CREATININE 1.07 04/13/2015 0415   CALCIUM 8.1* 04/13/2015 0415   GFRNONAA >60 04/13/2015 0415   GFRAA >60 04/13/2015 0415    INR    Component Value Date/Time   INR 1.16 04/10/2015 1459     Intake/Output Summary (Last 24 hours) at 04/13/15 0718 Last data filed at 04/13/15 0409  Gross per 24 hour  Intake 2157.5 ml  Output   1450 ml  Net  707.5 ml     Assessment:  64 y.o. male is s/p:  Thrombectomy of right axillofemoral bypass with exploration of right femoral artery and Dacron patch angioplasty of right femoral anastomosis  1 Day Post-Op  Plan: -patent right axillary to femoral bypass graft with palpable graft pulse and audible right PT signal -DVT prophylaxis:  Heparin gtt to restart this morning at 8am- please hold on re-starting heparin or NOAC for another day due to small hematoma/bleeding skin  edges right groin.  Okay to start coumadin if that is agent of choice. -14 beat run of VTach yesterday (asymptomatic)-cardiology following -thrombocytopenia-monitor platelets - down to 125k from 155k pre-op -acute surgical blood loss anemia-tolerating -mobilize - oob to chair tid with meals and walking -transfer to 2 west.   Doreatha Massed, PA-C [facility] [phone] 04/13/2015 7:18 AM    I have examined the patient, reviewed and agree with above. Comfortable. Palpable axillofemoral and palpable popliteal pulse. Some oozing from right groin incision but no hematoma collection. Hold anticoagulation until tomorrow.  Gretta Began, MD 04/13/2015 8:10 AM

## 2015-04-13 NOTE — Evaluation (Signed)
Physical Therapy Evaluation Patient Details Name: Anthony Meadows MRN: [id number] DOB: [birth year] Today's Date: 04/13/2015   History of Present Illness  pt is a 64 y/o male admitted with R foot ischemia due to accluded grafting, s/p Thrombectomy of right axillofemoral bypass with exploration of right femoral artery and Dacron patch angioplasty of right femoral anastomosis  Clinical Impression  Pt is at or close to baseline functioning and should be safe at home at independent level. There are no further acute PT needs.  Will sign off at this time.     Follow Up Recommendations No PT follow up    Equipment Recommendations       Recommendations for Other Services       Precautions / Restrictions Precautions Precautions: None      Mobility  Bed Mobility Overal bed mobility: Modified Independent                Transfers Overall transfer level: Modified independent                  Ambulation/Gait Ambulation/Gait assistance: Modified independent (Device/Increase time) Ambulation Distance (Feet): 250 Feet Assistive device: None Gait Pattern/deviations: WFL(Within Functional Limits) Gait velocity: slower to moderate   General Gait Details: steady with mild antalgia R LE  Stairs Stairs: Yes Stairs assistance: Independent Stair Management: No rails;Alternating pattern;Forwards Number of Stairs: 10 General stair comments: steady and safe without rail  Wheelchair Mobility    Modified Rankin (Stroke Patients Only)       Balance Overall balance assessment: No apparent balance deficits (not formally assessed)                                           Pertinent Vitals/Pain Pain Assessment: Faces Faces Pain Scale: Hurts little more Pain Location: R groin site Pain Descriptors / Indicators: Constant;Discomfort Pain Intervention(s): Monitored during session    Home Living Family/patient expects to be discharged to:: Private  residence Living Arrangements: Spouse/significant other Available Help at Discharge: Family Type of Home: House Home Access: Stairs to enter Entrance Stairs-Rails: Doctor, general practice of Steps: several          Prior Function Level of Independence: Independent               Hand Dominance        Extremity/Trunk Assessment   Upper Extremity Assessment: Overall WFL for tasks assessed           Lower Extremity Assessment: Overall WFL for tasks assessed      Cervical / Trunk Assessment: Normal  Communication   Communication: No difficulties  Cognition Arousal/Alertness: Awake/alert Behavior During Therapy: WFL for tasks assessed/performed Overall Cognitive Status: Within Functional Limits for tasks assessed                      General Comments      Exercises        Assessment/Plan    PT Assessment Patent does not need any further PT services  PT Diagnosis     PT Problem List    PT Treatment Interventions     PT Goals (Current goals can be found in the Care Plan section) Acute Rehab PT Goals PT Goal Formulation: All assessment and education complete, DC therapy    Frequency     Barriers to discharge        Co-evaluation  End of Session   Activity Tolerance: Patient tolerated treatment well;No increased pain Patient left: in bed;with family/visitor present;with call bell/phone within reach Nurse Communication: Mobility status         Time: 1239-1249 PT Time Calculation (min) (ACUTE ONLY): 10 min   Charges:   PT Evaluation $Initial PT Evaluation Tier I: 1 Procedure     PT G Codes:        Anthony Meadows, Anthony Meadows 04/13/2015, 12:55 PM 04/13/2015  Anthony Meadows, PT [phone]  (pager)

## 2015-04-13 NOTE — Progress Notes (Addendum)
SUBJECTIVE:  Denies chest pain or SOB  OBJECTIVE:   Vitals:   Filed Vitals:   04/13/15 0409 04/13/15 0830 04/13/15 0837 04/13/15 1226  BP: 102/61  113/48 107/56  Pulse: 49  63 66  Temp: 97.6 F (36.4 C) 97.8 F (36.6 C)  97.8 F (36.6 C)  TempSrc: Oral Oral  Oral  Resp: Height:  (1.676 m)     Weight: 151 lb 14.4 oz (68.9 kg)     SpO2: 100%  99% 96%   I&O's:   Intake/Output Summary (Last 24 hours) at 04/13/15 1315 Last data filed at 04/13/15 0745  Gross per 24 hour  Intake 1797.5 ml  Output    850 ml  Net  947.5 ml   TELEMETRY: Reviewed telemetry pt in NSR:     PHYSICAL EXAM General: Well developed, well nourished, in no acute distress Head: Eyes PERRLA, No xanthomas.   Normal cephalic and atramatic  Lungs:   Clear bilaterally to auscultation and percussion. Heart:   HRRR S1 S2 Pulses are 2+ & equal. Abdomen: Bowel sounds are positive, abdomen soft and non-tender without masses Extremities:   No clubbing, cyanosis or edema.  DP +1 Neuro: Alert and oriented X 3. Psych:  Good affect, responds appropriately   LABS: Basic Metabolic Panel:  Recent Labs  16/10/96 1459 04/13/15 0415  NA 138 137  K 3.7 4.2  CL 102 106  CO2 25 25  GLUCOSE 116* 101*  BUN 23* 16  CREATININE 1.13 1.07  CALCIUM 8.7* 8.1*   Liver Function Tests: No results for input(s): AST, ALT, ALKPHOS, BILITOT, PROT, ALBUMIN in the last 72 hours. No results for input(s): LIPASE, AMYLASE in the last 72 hours. CBC:  Recent Labs  04/10/15 1459  04/12/15 0130 04/13/15 0415  WBC 12.5*  < > 10.8* 9.9  NEUTROABS 7.1  --   --   --   HGB 17.3*  < > 15.0 13.2  HCT 49.5  < > 44.4 39.4  MCV 98.6  < > 97.6 99.0  PLT 178  < > 155 125*  < > = values in this interval not displayed. Cardiac Enzymes: No results for input(s): CKTOTAL, CKMB, CKMBINDEX, TROPONINI in the last 72 hours. BNP: Invalid input(s): POCBNP D-Dimer: No results for input(s): DDIMER in the last 72  hours. Hemoglobin A1C: No results for input(s): HGBA1C in the last 72 hours. Fasting Lipid Panel: No results for input(s): CHOL, HDL, LDLCALC, TRIG, CHOLHDL, LDLDIRECT in the last 72 hours. Thyroid Function Tests: No results for input(s): TSH, T4TOTAL, T3FREE, THYROIDAB in the last 72 hours.  Invalid input(s): FREET3 Anemia Panel: No results for input(s): VITAMINB12, FOLATE, FERRITIN, TIBC, IRON, RETICCTPCT in the last 72 hours. Coag Panel:   Lab Results  Component Value Date   INR 1.16 04/10/2015    RADIOLOGY: Dg Chest 2 View  04/11/2015   CLINICAL DATA:  Preoperative chest radiograph.  Initial encounter.  EXAM: CHEST  2 VIEW  COMPARISON:  None.  FINDINGS: The lungs are well-aerated and clear. There is no evidence of focal opacification, pleural effusion or pneumothorax.  The heart is mildly enlarged. The patient is status post median sternotomy, with evidence of prior CABG. AICD is noted at the left chest wall, with a single lead ending at the right ventricle. No acute osseous abnormalities are seen.  IMPRESSION: No acute cardiopulmonary process seen.   Electronically Signed   By: Roanna Raider M.D.   On: 04/11/2015 01:26  Ct Angio Ao+bifem W/cm &/or Wo/cm  04/10/2015   CLINICAL DATA:  Right leg pain.  History of fem-pop bypass.  EXAM: CT ANGIOGRAPHY OF ABDOMINAL AORTA WITH ILIOFEMORAL RUNOFF  TECHNIQUE: Multidetector CT imaging of the abdomen, pelvis and lower extremities was performed using the standard protocol during bolus administration of intravenous contrast. Multiplanar CT image reconstructions and MIPs were obtained to evaluate the vascular anatomy.  CONTRAST:  OMNIPAQUE IOHEXOL 350 MG/ML SOLN  COMPARISON:  None.  FINDINGS: Aorta: There is irregular mural thrombus in the suprarenal abdominal aorta at the level of the celiac trunk. There is irregular mural thrombus throughout the infrarenal abdominal aorta without aneurysm. The celiac trunk, SMA and IMA are patent. Bilateral  renal arteries are patent.  Right Lower Extremity: There are occluded metallic stents involving the right common iliac artery. No significant flow in the right common iliac artery or right external iliac artery. Distal reconstitution of the right internal iliac artery branches. There is a right axillary-femoral bypass graft which is occluded. There is reconstitution of flow at the right common femoral artery and likely through pelvic collaterals. The right profunda femoral arteries are patent. The native right SFA is severely stenosed and occludes in the proximal segment. Majority of the right SFA is stented and occluded. There is a patent right femoral-popliteal bypass graft. There appears to be a contrast-fluid layer within the right femoral-popliteal bypass probably related to the slow flow. However, there may be stenosis or thrombus involving the proximal and mid aspect of the graft. In particular, there is concern for focal stenosis on sequence 6, image 154. There appears to be proximal occlusion of the runoff vessels with questionable reconstitution of the posterior tibial artery and peroneal artery.  Left Lower Extremity: Left common iliac artery has diffuse disease without significant stenosis. There is stenosis at the origin of the left internal iliac artery. Diffuse plaque involving the left external iliac artery without a critical stenosis. The left common femoral artery is patent. The left profunda femoral arteries are patent. There is segmental disease and stenosis throughout the proximal left superficial femoral artery. There is occlusion of the mid left SFA with reconstitution of the left popliteal artery. There is critical stenosis of the left popliteal artery at the knee joint. Left popliteal artery artery is displaced medial due to the exophytic tibial bone lesion. There appears to be least a short-segment occlusion of the popliteal artery. Reconstitution of the anterior tibial artery and peroneal  artery. Distal reconstitution of the posterior tibial artery. Flow across the ankle from the dorsalis pedis artery.  Nonvascular structures: Lung bases are clear. There is a large amount of contrast refluxing into the hepatic veins. No gross abnormality to the liver, gallbladder or spleen. Normal appearance of the pancreas. There is a 1.9 cm nodule involving the left adrenal gland with Hounsfield units of 4. This is compatible with a left adrenal adenoma. Normal appearance of the right adrenal gland. Exophytic low-density cyst involving the right kidney measures 3.4 cm. There is at least 1 additional right renal cyst. Evidence for multiple left renal cysts. Largest left renal cyst appears to have peripheral wall calcification and measures up to 5.7 cm. This large left renal cyst also appears to have a small lobulated component. No significant lymphadenopathy in the abdomen or pelvis. There is a small amount of free fluid in the pelvis. No gross abnormality to the prostate or urinary bladder. There is mild stranding or edema along the left lower quadrant and left  paracolic gutter region. No acute abnormality to the appendix. No gross abnormality to the small or large bowel.  There is an exophytic bony lesion along the posterior lateral aspect of the proximal tibia. This lesion has patchy areas of sclerosis and measures roughly 4.6 x 4.9 x 3.8 cm. This lesion appears to be displacing and remodeling the proximal fibula with some bony fusion. This is likely a slow growing benign lesion such as a osteochondroma.  Review of the MIP images confirms the above findings.  IMPRESSION: Occluded right axillary-femoral bypass. The right common iliac artery and right external iliac artery are occluded. Reconstitution of the right lower extremity arteries through collateral flow.  Patent right femoral-popliteal bypass but there is concern for nonocclusive thrombus or stenosis within the graft.  Severe right runoff disease with  minimal flow in the right calf arteries.  Left outflow disease demonstrated by occlusion of the mid and distal left SFA. Critical stenosis and occlusion of the mid and distal left popliteal artery.  Extensive atherosclerotic disease in the abdominal aorta with irregular mural thrombus. Negative for an abdominal aortic aneurysm.  Small amount of free fluid in the pelvis with edema or stranding in the left lower quadrant of the abdomen. These findings are nonspecific.  Bilateral renal cysts. Dominant left renal cyst appears to be at least mildly complex. Consider further evaluation with ultrasound.  Unusual exophytic bony lesion involving the proximal left tibia which appears to be displacing and fusing with the proximal left fibula. 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   Electronically Signed   By: Richarda Overlie M.D.   On: 04/10/2015 17:48   Nm Myocar Multi W/spect W/wall Motion / Ef  04/11/2015   CLINICAL DATA:  Preoperative evaluation, history of CHF, AICD, hypertension  EXAM: MYOCARDIAL IMAGING WITH SPECT (REST AND PHARMACOLOGIC-STRESS)  GATED LEFT VENTRICULAR WALL MOTION STUDY  LEFT VENTRICULAR EJECTION FRACTION  TECHNIQUE: Standard myocardial SPECT imaging was performed after resting intravenous injection of 10 mCi Tc-15m sestamibi. Subsequently, intravenous infusion of Lexiscan was performed under the supervision of the Cardiology staff. At peak effect of the drug, 30 mCi Tc-62m sestamibi was injected intravenously and standard myocardial SPECT imaging was performed. Quantitative gated imaging was also performed to evaluate left ventricular wall motion, and estimate left ventricular ejection fraction.  COMPARISON:  None.  FINDINGS: Perfusion: Fixed large heterogeneous perfusion defect of the anterior, lateral, and inferior walls. This also involves the apex. Relatively preserved perfusion to the septum. No definite inducible or reversible ischemia by pharmacologic stress.  Wall Motion: Diffuse  global hypokinesis with left ventricular chamber dilatation  Left Ventricular Ejection Fraction: 20 %  End diastolic volume 215 ml  End systolic volume 173 ml  IMPRESSION: 1. Large fixed heterogeneous perfusion defect of the anterior, lateral and inferior walls including the apex. No definite reversible ischemia by pharmacologic stress.  2. Global hypokinesis with left ventricular chamber dilatation.  3. Left ventricular ejection fraction 20%  4. High-risk stress test findings*.  *2012 Appropriate Use Criteria for Coronary Revascularization Focused Update: J Am Coll Cardiol. 2012;59(9):857-881. http://content.dementiazones.com.aspx?articleid=1201161   Electronically Signed   By: Judie Petit.  Shick M.D.   On: 04/11/2015 15:48   ASSESSMENT/PLAN: 1. Chronic Systolic CHF with NYHA class 3  recent within 1 week exacerbation. BNP elevated on admission but appears euvolemic on exam today. This may be where his BNP is chronically. Continue BB/ACE I/diuretic. 2D echo with severely reduced LVF EF 20-25% with moderate MR. No pulmonary HTN. His wife says that his EF  has been 20% since his AICD. I received records from cardiologist in [place] - EF 25-30% by echo 07/2013  2. CAD s/p CABG in 2012 - Nuclear stress test showed Large fixed heterogeneous perfusion defect of the anterior,lateral and inferior walls including the apex. No definite reversible ischemia by pharmacologic stress and EF 20%. Continue ASA/BB. He has no ischemia on myoview.His lungs are clear on exam today and he has no LE edema.   3. s/p ICD (possibly medtronic)   4. Afib (not on warfarin) - has not been on warfarin for ? Reasons. No history of bleeding issues.  Would start Eliquis  BID at discharge when ok with vascular surgery. Vascular surgery to determine if he needs to stay on Plavix or ASA as well but would not recommend triple therapy.    5. HTN - BP controlled and soft on BB/ACE I  6. HLD - continue high dose  statin  7. Tobacco use - encouraged cessation  8. PVD with multiple stents in the past and bypass now with increased claudication and occluded right common iliac artery/superficial femoral artery and occluded right axillary to fem bypass s/p  Thrombectomy of right axillofemoral bypass and Dacron patch angioplasty of right femoral anastamosis    Quintella Reichert, MD  04/13/2015  1:15 PM

## 2015-04-13 NOTE — Progress Notes (Signed)
UR COMPLETED  

## 2015-04-14 DIAGNOSIS — I481 Persistent atrial fibrillation: Secondary | ICD-10-CM

## 2015-04-14 LAB — CBC
HEMATOCRIT: 37.5 % — AB (ref 39.0–52.0)
HEMOGLOBIN: 12.7 g/dL — AB (ref 13.0–17.0)
MCH: 33.6 pg (ref 26.0–34.0)
MCHC: 33.9 g/dL (ref 30.0–36.0)
MCV: 99.2 fL (ref 78.0–100.0)
Platelets: 119 10*3/uL — ABNORMAL LOW (ref 150–400)
RBC: 3.78 MIL/uL — ABNORMAL LOW (ref 4.22–5.81)
RDW: 13.3 % (ref 11.5–15.5)
WBC: 11.8 10*3/uL — AB (ref 4.0–10.5)

## 2015-04-14 MED ORDER — RIVAROXABAN 20 MG PO TABS: 20.0000 mg | ORAL_TABLET | Freq: Every day | ORAL | Status: DC

## 2015-04-14 MED ORDER — RIVAROXABAN 20 MG PO TABS
20.0000 mg | ORAL_TABLET | Freq: Every day | ORAL | Status: DC
  Filled 2015-04-14: qty 1

## 2015-04-14 MED ORDER — RIVAROXABAN (XARELTO) EDUCATION KIT FOR DVT/PE PATIENTS
PACK | Freq: Once | Status: AC
  Administered 2015-04-14: 12:00:00
  Filled 2015-04-14: qty 1

## 2015-04-14 MED ORDER — OXYCODONE HCL 5 MG PO TABS: 5.0000 mg | ORAL_TABLET | Freq: Four times a day (QID) | ORAL | Status: DC | PRN

## 2015-04-14 NOTE — Progress Notes (Signed)
04/14/2015 11:07 AM Nursing note Pt. Had 10 beats VTach at 1010. Noted BB held this am due to bradycardia. Pt. With PPM demand pacing at rate of 50. Karsten Ro PAC on floor and made aware. No new orders. Ok to d/c pt. As ordered. Heriberto Stmartin, Blanchard Kelch

## 2015-04-14 NOTE — Discharge Summary (Signed)
Discharge Summary     Anthony Meadows 11-21-1950 64 y.o. male  [id number]  Admission Date: 04/10/2015  Discharge Date: 04/14/15  Physician: Larina Earthly, MD  Admission Diagnosis: Leg pain [M79.606] Lower extremity pain, diffuse, right [M79.601, M79.604]   HPI:  This is a 64 y.o. male with a complex past peripheral vascular and cardiac history. He is status post multiple revascularizations of his right lower extremity and outlying hospitals in [place]. Also status post coronary artery bypass grafting in 2003 and also status post AICD placement. He reports that 5 days ago he had sudden onset of right leg claudication symptoms. There was no progression of this. Happened suddenly and he has been trying to "walk it off" for 5 days. He reports that he does not become any worse. He did present to [facility] emergency department this evening for further evaluation. He denies any rest pain. He reports that he can walk but after a few minutes he does have cramping and burning in his right calf and reports that if he continues to walk this can extend into his thigh and he has to stop. Not have any tissue loss. Does report that he does have occasional exacerbation of his congestive heart failure. Reports that he went to an outlying hospital and [place] within the last 2 weeks with the fluid overload and required diuresis. He does have COPD but unfortunately continue to smoke a pack cigarettes per day.  He does have a history of multiple stents placed in his right common iliac artery and right superficial femoral artery. He subsequently underwent right axillary to femoral and femoral-popliteal bypass. The patient recalls that this was all during the same procedure approximate 4 years ago. He apparently had a very stormy postoperative course. Had difficulty being extubated and eventually was able to leave the hospital.  Hospital Course:  The patient was admitted to the hospital and taken to the  operating room on 04/10/2015 - 04/12/2015 and underwent: Thrombectomy of right axillofemoral bypass with exploration of right femoral artery and Dacron patch angioplasty of right femoral anastomosis    The pt tolerated the procedure well and was transported to the PACU in good condition.   The pt was in atrial fibrillation and cardiology was consulted.  He had been placed on a heparin gtt and was ultimately started on Xarelto.  He is to f/u with Dr. Elouise Munroe in 1-2 weeks.    He did have a drop in his platelet count post operatively.  ABI's 04/13/15:  RIGHT    LEFT    PRESSURE WAVEFORM  PRESSURE WAVEFORM  BRACHIAL 120 Tri BRACHIAL 115 Tri  DP   DP    AT 72 Mono AT 81 Mono  PT 76 Mono PT  Not audible  PER   PER  Not audible  GREAT TOE  NA GREAT TOE  NA    RIGHT LEFT  ABI 0.63 0.68         The remainder of the hospital course consisted of increasing mobilization and increasing intake of solids without difficulty.  CBC    Component Value Date/Time   WBC 11.8* 04/14/2015 0401   RBC 3.78* 04/14/2015 0401   HGB 12.7* 04/14/2015 0401   HCT 37.5* 04/14/2015 0401   PLT 119* 04/14/2015 0401   MCV 99.2 04/14/2015 0401   MCH 33.6 04/14/2015 0401   MCHC 33.9 04/14/2015 0401   RDW 13.3 04/14/2015 0401   LYMPHSABS 4.3* 04/10/2015 1459   MONOABS 1.0 04/10/2015 1459   EOSABS  0.1 04/10/2015 1459   BASOSABS 0.0 04/10/2015 1459    BMET    Component Value Date/Time   NA 137 04/13/2015 0415   K 4.2 04/13/2015 0415   CL 106 04/13/2015 0415   CO2 25 04/13/2015 0415   GLUCOSE 101* 04/13/2015 0415   BUN 16 04/13/2015 0415   CREATININE 1.07 04/13/2015 0415   CALCIUM 8.1* 04/13/2015 0415   GFRNONAA >60 04/13/2015 0415   GFRAA >60 04/13/2015 0415       Discharge Instructions    Call MD for:  redness, tenderness, or signs of infection (pain, swelling, bleeding, redness, odor or green/yellow discharge around incision site)    Complete  by:  As directed      Call MD for:  severe or increased pain, loss or decreased feeling  in affected limb(s)    Complete by:  As directed      Call MD for:  temperature >100.5    Complete by:  As directed      Discharge wound care:    Complete by:  As directed   Wash the groin wound with soap and water daily and pat dry. (No tub bath-only shower)  Then put a dry gauze or washcloth there to keep this area dry daily and as needed.  Do not use Vaseline or neosporin on your incisions.  Only use soap and water on your incisions and then protect and keep dry.     Driving Restrictions    Complete by:  As directed   No driving for 2 weeks and while taking pain medication.     Lifting restrictions    Complete by:  As directed   No lifting for 4 weeks     Resume previous diet    Complete by:  As directed            Discharge Diagnosis:  Leg pain [M79.606] Lower extremity pain, diffuse, right [M79.601, M79.604]  Secondary Diagnosis: Patient Active Problem List   Diagnosis Date Noted  . CAD (coronary artery disease), native coronary artery 04/11/2015  . Benign essential HTN 04/11/2015  . Atrial fibrillation 04/11/2015  . Chronic systolic heart failure 04/11/2015  . Pre-op evaluation   . PVD (peripheral vascular disease) 04/10/2015   Past Medical History  Diagnosis Date  . CHF (congestive heart failure)   . Hypertension   . AICD (automatic cardioverter/defibrillator) present   . COPD (chronic obstructive pulmonary disease)   . Hypercholesterolemia   . Myocardial infarction 2002    "before heart OR"  . Coronary artery disease   . PVD (peripheral vascular disease)        Medication List    STOP taking these medications        clopidogrel 75 MG tablet  Commonly known as:  PLAVIX      TAKE these medications        atorvastatin 80 MG tablet  Commonly known as:  LIPITOR  Take 80 mg by mouth daily.     carvedilol 25 MG tablet  Commonly known as:  COREG  Take 25 mg by  mouth 2 (two) times daily with a meal.     enalapril 20 MG tablet  Commonly known as:  VASOTEC  Take 20 mg by mouth daily.     furosemide 20 MG tablet  Commonly known as:  LASIX  Take 20 mg by mouth daily.     oxyCODONE 5 MG immediate release tablet  Commonly known as:  ROXICODONE  Take 1 tablet (5  mg total) by mouth every 6 (six) hours as needed.     predniSONE 50 MG tablet  Commonly known as:  DELTASONE  Take 50 mg by mouth daily. 5 day course starting 04/04/15     rivaroxaban 20 MG Tabs tablet  Commonly known as:  XARELTO  Take 1 tablet (20 mg total) by mouth daily with supper.        Prescriptions given: 1.  Roxicodone #30 No Refill 2.  Xarelto  daily  #30 3RF  Instructions: 1.  Discontinue Plavix  Disposition: home  Patient's condition: is Good  Follow up: 1. Dr. Arbie Cookey in 2 weeks 2. Dr. Rockne Menghini in 1 week for Atrial Fibrillation/New Xarelto    Doreatha Massed, PA-C [facility] [phone] 04/14/2015  10:03 AM  - For VQI Registry use --- Instructions: Press F2 to tab through selections.  Delete question if not applicable.   Post-op:  Wound infection: No  Graft infection: No  Transfusion: No  If yes, n/a units given New Arrhythmia: No Ipsilateral amputation: No,  Minor,  BKA,  AKA Discharge patency:  Primary, [ x] Primary assisted,  Secondary,  Occluded Patency judged by:  Dopper only,  Palpable graft pulse, [ x] Palpable distal popliteal pulse,  ABI inc. > 0.15,  Duplex Discharge ABI: R 0.63, L 0.68 Discharge TBI: R , L  D/C Ambulatory Status: Ambulatory  Complications: MI: No,  Troponin only,  EKG or Clinical CHF: No Resp failure:No,  Pneumonia,  Ventilator Chg in renal function: No,  Inc. Cr > 0.5,  Temp. Dialysis,  Permanent dialysis Stroke: No,  Minor,  Major Return to OR: No  Reason for return to OR:  Bleeding,  Infection,  Thrombosis,   Revision  Discharge medications: Statin use:  yes ASA use:  no Plavix use:  no Beta blocker use: yes Coumadin use: no Xarelto:  yes

## 2015-04-14 NOTE — Progress Notes (Signed)
Patient ID: Anthony Meadows, male   DOB: 28-Sep-1951, 64 y.o.   MRN: [id number] Comfortable this morning. No further drainage from right groin. 2-3+ right popliteal pulse. Plan discharge today on Xaralto.

## 2015-04-14 NOTE — Discharge Instructions (Signed)
Go to [facility] emergency department now.  Dr Tawanna Cooler Early to evaluate your leg.  Nothing to eat or drink  Information on my medicine - XARELTO (Rivaroxaban)  This medication education was reviewed with me or my healthcare representative as part of my discharge preparation.    Why was Xarelto prescribed for you? Xarelto was prescribed for you to reduce the risk of a blood clot forming that can cause a stroke if you have a medical condition called atrial fibrillation (a type of irregular heartbeat).  What do you need to know about xarelto ? Take your Xarelto ONCE DAILY at the same time every day with your evening meal. If you have difficulty swallowing the tablet whole, you may crush it and mix in applesauce just prior to taking your dose.  Take Xarelto exactly as prescribed by your doctor and DO NOT stop taking Xarelto without talking to the doctor who prescribed the medication.  Stopping without other stroke prevention medication to take the place of Xarelto may increase your risk of developing a clot that causes a stroke.  Refill your prescription before you run out.  After discharge, you should have regular check-up appointments with your healthcare provider that is prescribing your Xarelto.  In the future your dose may need to be changed if your kidney function or weight changes by a significant amount.  What do you do if you miss a dose? If you are taking Xarelto ONCE DAILY and you miss a dose, take it as soon as you remember on the same day then continue your regularly scheduled once daily regimen the next day. Do not take two doses of Xarelto at the same time or on the same day.   Important Safety Information A possible side effect of Xarelto is bleeding. You should call your healthcare provider right away if you experience any of the following: ? Bleeding from an injury or your nose that does not stop. ? Unusual colored urine (red or dark brown) or unusual colored stools  (red or black). ? Unusual bruising for unknown reasons. ? A serious fall or if you hit your head (even if there is no bleeding).  Some medicines may interact with Xarelto and might increase your risk of bleeding while on Xarelto. To help avoid this, consult your healthcare provider or pharmacist prior to using any new prescription or non-prescription medications, including herbals, vitamins, non-steroidal anti-inflammatory drugs (NSAIDs) and supplements.  This website has more information on Xarelto: VisitDestination.com.br.

## 2015-04-14 NOTE — Progress Notes (Addendum)
SUBJECTIVE:  No complaints  OBJECTIVE:   Vitals:   Filed Vitals:   04/13/15 1226 04/13/15 1415 04/13/15 2016 04/14/15 0452  BP: 107/56 110/58 105/50 110/57  Pulse: 66 65 65 61  Temp: 97.8 F (36.6 C) 97.5 F (36.4 C) 97.9 F (36.6 C) 97.9 F (36.6 C)  TempSrc: Oral Oral Oral Oral  Resp: 18 18 18 18   Height:      Weight:    151 lb 1.6 oz (68.539 kg)  SpO2: 96% 95% 98% 97%   I&O's:   Intake/Output Summary (Last 24 hours) at 04/14/15 0919 Last data filed at 04/13/15 2017  Gross per 24 hour  Intake    840 ml  Output      0 ml  Net    840 ml   TELEMETRY: Reviewed telemetry pt in Atrial fibrillation with short runs of WCT that is most likely abberation.  :     PHYSICAL EXAM General: Well developed, well nourished, in no acute distress Head: Eyes PERRLA, No xanthomas.   Normal cephalic and atramatic  Lungs:   Clear bilaterally to auscultation and percussion. Heart:   irregularly irregular S1 S2 Pulses are 2+ & equal. Abdomen: Bowel sounds are positive, abdomen soft and non-tender without masses  Extremities:   No clubbing, cyanosis or edema.  DP +1 Neuro: Alert and oriented X 3. Psych:  Good affect, responds appropriately   LABS: Basic Metabolic Panel:  Recent Labs  95/28/41 0415  NA 137  K 4.2  CL 106  CO2 25  GLUCOSE 101*  BUN 16  CREATININE 1.07  CALCIUM 8.1*   Liver Function Tests: No results for input(s): AST, ALT, ALKPHOS, BILITOT, PROT, ALBUMIN in the last 72 hours. No results for input(s): LIPASE, AMYLASE in the last 72 hours. CBC:  Recent Labs  04/13/15 0415 04/14/15 0401  WBC 9.9 11.8*  HGB 13.2 12.7*  HCT 39.4 37.5*  MCV 99.0 99.2  PLT 125* 119*   Cardiac Enzymes: No results for input(s): CKTOTAL, CKMB, CKMBINDEX, TROPONINI in the last 72 hours. BNP: Invalid input(s): POCBNP D-Dimer: No results for input(s): DDIMER in the last 72 hours. Hemoglobin A1C: No results for input(s): HGBA1C in the last 72 hours. Fasting Lipid  Panel: No results for input(s): CHOL, HDL, LDLCALC, TRIG, CHOLHDL, LDLDIRECT in the last 72 hours. Thyroid Function Tests: No results for input(s): TSH, T4TOTAL, T3FREE, THYROIDAB in the last 72 hours.  Invalid input(s): FREET3 Anemia Panel: No results for input(s): VITAMINB12, FOLATE, FERRITIN, TIBC, IRON, RETICCTPCT in the last 72 hours. Coag Panel:   Lab Results  Component Value Date   INR 1.16 04/10/2015    RADIOLOGY: Dg Chest 2 View  04/11/2015   CLINICAL DATA:  Preoperative chest radiograph.  Initial encounter.  EXAM: CHEST  2 VIEW  COMPARISON:  None.  FINDINGS: The lungs are well-aerated and clear. There is no evidence of focal opacification, pleural effusion or pneumothorax.  The heart is mildly enlarged. The patient is status post median sternotomy, with evidence of prior CABG. AICD is noted at the left chest wall, with a single lead ending at the right ventricle. No acute osseous abnormalities are seen.  IMPRESSION: No acute cardiopulmonary process seen.   Electronically Signed   By: Roanna Raider M.D.   On: 04/11/2015 01:26   Ct Angio Ao+bifem W/cm &/or Wo/cm  04/10/2015   CLINICAL DATA:  Right leg pain.  History of fem-pop bypass.  EXAM: CT ANGIOGRAPHY OF ABDOMINAL AORTA WITH ILIOFEMORAL RUNOFF  TECHNIQUE:  Multidetector CT imaging of the abdomen, pelvis and lower extremities was performed using the standard protocol during bolus administration of intravenous contrast. Multiplanar CT image reconstructions and MIPs were obtained to evaluate the vascular anatomy.  CONTRAST:  OMNIPAQUE IOHEXOL 350 MG/ML SOLN  COMPARISON:  None.  FINDINGS: Aorta: There is irregular mural thrombus in the suprarenal abdominal aorta at the level of the celiac trunk. There is irregular mural thrombus throughout the infrarenal abdominal aorta without aneurysm. The celiac trunk, SMA and IMA are patent. Bilateral renal arteries are patent.  Right Lower Extremity: There are occluded metallic stents involving  the right common iliac artery. No significant flow in the right common iliac artery or right external iliac artery. Distal reconstitution of the right internal iliac artery branches. There is a right axillary-femoral bypass graft which is occluded. There is reconstitution of flow at the right common femoral artery and likely through pelvic collaterals. The right profunda femoral arteries are patent. The native right SFA is severely stenosed and occludes in the proximal segment. Majority of the right SFA is stented and occluded. There is a patent right femoral-popliteal bypass graft. There appears to be a contrast-fluid layer within the right femoral-popliteal bypass probably related to the slow flow. However, there may be stenosis or thrombus involving the proximal and mid aspect of the graft. In particular, there is concern for focal stenosis on sequence 6, image 154. There appears to be proximal occlusion of the runoff vessels with questionable reconstitution of the posterior tibial artery and peroneal artery.  Left Lower Extremity: Left common iliac artery has diffuse disease without significant stenosis. There is stenosis at the origin of the left internal iliac artery. Diffuse plaque involving the left external iliac artery without a critical stenosis. The left common femoral artery is patent. The left profunda femoral arteries are patent. There is segmental disease and stenosis throughout the proximal left superficial femoral artery. There is occlusion of the mid left SFA with reconstitution of the left popliteal artery. There is critical stenosis of the left popliteal artery at the knee joint. Left popliteal artery artery is displaced medial due to the exophytic tibial bone lesion. There appears to be least a short-segment occlusion of the popliteal artery. Reconstitution of the anterior tibial artery and peroneal artery. Distal reconstitution of the posterior tibial artery. Flow across the ankle from the  dorsalis pedis artery.  Nonvascular structures: Lung bases are clear. There is a large amount of contrast refluxing into the hepatic veins. No gross abnormality to the liver, gallbladder or spleen. Normal appearance of the pancreas. There is a 1.9 cm nodule involving the left adrenal gland with Hounsfield units of 4. This is compatible with a left adrenal adenoma. Normal appearance of the right adrenal gland. Exophytic low-density cyst involving the right kidney measures 3.4 cm. There is at least 1 additional right renal cyst. Evidence for multiple left renal cysts. Largest left renal cyst appears to have peripheral wall calcification and measures up to 5.7 cm. This large left renal cyst also appears to have a small lobulated component. No significant lymphadenopathy in the abdomen or pelvis. There is a small amount of free fluid in the pelvis. No gross abnormality to the prostate or urinary bladder. There is mild stranding or edema along the left lower quadrant and left paracolic gutter region. No acute abnormality to the appendix. No gross abnormality to the small or large bowel.  There is an exophytic bony lesion along the posterior lateral aspect of the proximal  tibia. This lesion has patchy areas of sclerosis and measures roughly 4.6 x 4.9 x 3.8 cm. This lesion appears to be displacing and remodeling the proximal fibula with some bony fusion. This is likely a slow growing benign lesion such as a osteochondroma.  Review of the MIP images confirms the above findings.  IMPRESSION: Occluded right axillary-femoral bypass. The right common iliac artery and right external iliac artery are occluded. Reconstitution of the right lower extremity arteries through collateral flow.  Patent right femoral-popliteal bypass but there is concern for nonocclusive thrombus or stenosis within the graft.  Severe right runoff disease with minimal flow in the right calf arteries.  Left outflow disease demonstrated by occlusion of the  mid and distal left SFA. Critical stenosis and occlusion of the mid and distal left popliteal artery.  Extensive atherosclerotic disease in the abdominal aorta with irregular mural thrombus. Negative for an abdominal aortic aneurysm.  Small amount of free fluid in the pelvis with edema or stranding in the left lower quadrant of the abdomen. These findings are nonspecific.  Bilateral renal cysts. Dominant left renal cyst appears to be at least mildly complex. Consider further evaluation with ultrasound.  Unusual exophytic bony lesion involving the proximal left tibia which appears to be displacing and fusing with the proximal left fibula. 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   Electronically Signed   By: Richarda Overlie M.D.   On: 04/10/2015 17:48   Nm Myocar Multi W/spect W/wall Motion / Ef  04/11/2015   CLINICAL DATA:  Preoperative evaluation, history of CHF, AICD, hypertension  EXAM: MYOCARDIAL IMAGING WITH SPECT (REST AND PHARMACOLOGIC-STRESS)  GATED LEFT VENTRICULAR WALL MOTION STUDY  LEFT VENTRICULAR EJECTION FRACTION  TECHNIQUE: Standard myocardial SPECT imaging was performed after resting intravenous injection of 10 mCi Tc-28m sestamibi. Subsequently, intravenous infusion of Lexiscan was performed under the supervision of the Cardiology staff. At peak effect of the drug, 30 mCi Tc-83m sestamibi was injected intravenously and standard myocardial SPECT imaging was performed. Quantitative gated imaging was also performed to evaluate left ventricular wall motion, and estimate left ventricular ejection fraction.  COMPARISON:  None.  FINDINGS: Perfusion: Fixed large heterogeneous perfusion defect of the anterior, lateral, and inferior walls. This also involves the apex. Relatively preserved perfusion to the septum. No definite inducible or reversible ischemia by pharmacologic stress.  Wall Motion: Diffuse global hypokinesis with left ventricular chamber dilatation  Left Ventricular Ejection Fraction:  20 %  End diastolic volume 215 ml  End systolic volume 173 ml  IMPRESSION: 1. Large fixed heterogeneous perfusion defect of the anterior, lateral and inferior walls including the apex. No definite reversible ischemia by pharmacologic stress.  2. Global hypokinesis with left ventricular chamber dilatation.  3. Left ventricular ejection fraction 20%  4. High-risk stress test findings*.  *2012 Appropriate Use Criteria for Coronary Revascularization Focused Update: J Am Coll Cardiol. 2012;59(9):857-881. http://content.dementiazones.com.aspx?articleid=1201161   Electronically Signed   By: Judie Petit.  Shick M.D.   On: 04/11/2015 15:48    ASSESSMENT/PLAN: 1. Chronic Systolic CHF with NYHA class 3  recent within 1 week exacerbation. BNP elevated on admission but appears euvolemic on exam today. This may be where his BNP is chronically. Continue BB/ACE I/diuretic. 2D echo with severely reduced LVF EF 20-25% with moderate MR. No pulmonary HTN. His wife says that his EF has been 20% since his AICD. I received records from cardiologist in [place] - EF 25-30% by echo 07/2013  2. CAD s/p CABG in 2012 - Nuclear stress test showed Large  fixed heterogeneous perfusion defect of the anterior,lateral and inferior walls including the apex. No definite reversible ischemia by pharmacologic stress and EF 20%. Continue ASA/BB. He has no ischemia on myoview.His lungs are clear on exam today and he has no LE edema.   3. s/p ICD (possibly medtronic)   4. Afib (not on warfarin) - not sure when afib started (? With recent CHF) - has not been on warfarin for ? Reasons. No history of bleeding issues.This patients CHA2DS2-VASc Score and unadjusted Ischemic Stroke Rate (% per year) is equal to  3.2% stroke rate/year from a score of  3.  Above score calculated as 1 point each if present [CHF, HTN, DM, Vascular=MI/PAD/Aortic Plaque, Age if 65-74, or Male] above score calculated as 2 points each if present [Age > 75, or  Stroke/TIA/TE] Discussed with Dr. Arbie Cookey and he does not need to be on ASA or Plavix so will start Xarelto  daily since is a once a day med.  He will need early followup with his Cardiologist in [place] with possible DCCV in 4 weeks.     5. HTN - BP controlled and soft on BB/ACE I  6. HLD - continue high dose statin  7. Tobacco use - encouraged cessation  8. PVD with multiple stents in the past and bypass now with increased claudication and occluded right common iliac artery/superficial femoral artery and occluded right axillary to fem bypass s/p Thrombectomy of right axillofemoral bypass and Dacron patch angioplasty of right femoral anastamosis  Will need close followup with his Cardiologist.  No other recs. Will sign off.  Call with any questions.   Quintella Reichert, MD  04/14/2015  9:19 AM

## 2015-04-14 NOTE — Progress Notes (Signed)
Pt has remained in a-fib in the 50s to 60s this shift. He has denied any palpitations. He is ambulating around bedroom without any shortness of breath noted. He is stable and ready for discharge to home. I reviewed discharge instructions with pt. He verbalized understanding. Xarelto teaching kit given to pt. Wife transporting pt home. Pt presently waiting for wheel chair for discharge.

## 2015-04-14 NOTE — Care Management Note (Signed)
Case Management Note  Patient Details  Name: Anthony Meadows MRN: [id number] Date of Birth: 08/02/1951  Subjective/Objective:     Pt admitted with PVP, pt had right femoral pop bypass               Action/Plan:  Pt is from home independent with wife.  Pt denies needs and has not had home health services in the past.  CM will continue to monitor for disposition needs.   Expected Discharge Date:                  Expected Discharge Plan:  Home/Self Care  In-House Referral:     Discharge planning Services  CM Consult  Post Acute Care Choice:    Choice offered to:     DME Arranged:    DME Agency:     HH Arranged:    HH Agency:     Status of Service: Complete, will sign off  Medicare Important Message Given:  Yes Date Medicare IM Given:  04/13/15 Medicare IM give by:  Gae Gallop RN Date Additional Medicare IM Given:  04/14/15 Additional Medicare Important Message give by:  Raynald Blend  If discussed at Long Length of Stay Meetings, dates discussed:    Additional Comments:  Cherylann Parr, RN 04/14/2015, 11:11 AM

## 2015-04-19 ENCOUNTER — Telehealth: Payer: Self-pay | Admitting: Vascular Surgery

## 2015-04-19 NOTE — Telephone Encounter (Signed)
confirmed with patient his appointment with dr. Cleatrice Burkezakery on 04-27-15 at 12:45

## 2015-04-28 ENCOUNTER — Encounter: Payer: Self-pay | Admitting: Vascular Surgery

## 2015-05-02 ENCOUNTER — Encounter: Payer: Medicare Other | Admitting: Vascular Surgery

## 2015-06-12 ENCOUNTER — Encounter: Payer: Self-pay | Admitting: Vascular Surgery

## 2015-06-12 ENCOUNTER — Inpatient Hospital Stay
Admission: EM | Admit: 2015-06-12 | Payer: Self-pay | Source: Other Acute Inpatient Hospital | Admitting: Emergency Medicine

## 2015-06-13 ENCOUNTER — Encounter: Payer: Medicare Other | Admitting: Vascular Surgery

## 2016-02-02 ENCOUNTER — Encounter (HOSPITAL_COMMUNITY): Payer: Self-pay | Admitting: Emergency Medicine

## 2016-02-02 ENCOUNTER — Emergency Department (HOSPITAL_COMMUNITY)
Admission: EM | Admit: 2016-02-02 | Discharge: 2016-02-02 | Disposition: A | Discharge status: Home / Self Care | Payer: Medicare Other | Attending: Emergency Medicine | Admitting: Emergency Medicine

## 2016-02-02 DIAGNOSIS — Z79899 Other long term (current) drug therapy: Secondary | ICD-10-CM | POA: Diagnosis not present

## 2016-02-02 DIAGNOSIS — R112 Nausea with vomiting, unspecified: Secondary | ICD-10-CM | POA: Diagnosis not present

## 2016-02-02 DIAGNOSIS — I252 Old myocardial infarction: Secondary | ICD-10-CM | POA: Diagnosis not present

## 2016-02-02 DIAGNOSIS — R1110 Vomiting, unspecified: Secondary | ICD-10-CM

## 2016-02-02 DIAGNOSIS — F17210 Nicotine dependence, cigarettes, uncomplicated: Secondary | ICD-10-CM | POA: Diagnosis not present

## 2016-02-02 DIAGNOSIS — Z7984 Long term (current) use of oral hypoglycemic drugs: Secondary | ICD-10-CM | POA: Insufficient documentation

## 2016-02-02 DIAGNOSIS — I110 Hypertensive heart disease with heart failure: Secondary | ICD-10-CM | POA: Diagnosis not present

## 2016-02-02 DIAGNOSIS — I509 Heart failure, unspecified: Secondary | ICD-10-CM | POA: Diagnosis not present

## 2016-02-02 DIAGNOSIS — J449 Chronic obstructive pulmonary disease, unspecified: Secondary | ICD-10-CM | POA: Insufficient documentation

## 2016-02-02 DIAGNOSIS — I2510 Atherosclerotic heart disease of native coronary artery without angina pectoris: Secondary | ICD-10-CM | POA: Insufficient documentation

## 2016-02-02 HISTORY — DX: Unspecified atrial fibrillation: I48.91

## 2016-02-02 LAB — COMPREHENSIVE METABOLIC PANEL
ALT: 29 U/L (ref 17–63)
AST: 31 U/L (ref 15–41)
Albumin: 4.1 g/dL (ref 3.5–5.0)
Alkaline Phosphatase: 28 U/L — ABNORMAL LOW (ref 38–126)
Anion gap: 9 (ref 5–15)
BUN: 23 mg/dL — AB (ref 6–20)
CHLORIDE: 102 mmol/L (ref 101–111)
CO2: 27 mmol/L (ref 22–32)
Calcium: 8.8 mg/dL — ABNORMAL LOW (ref 8.9–10.3)
Creatinine, Ser: 1.68 mg/dL — ABNORMAL HIGH (ref 0.61–1.24)
GFR calc Af Amer: 48 mL/min — ABNORMAL LOW (ref >60)
GFR calc non Af Amer: 42 mL/min — ABNORMAL LOW (ref >60)
GLUCOSE: 133 mg/dL — AB (ref 65–99)
Potassium: 3.8 mmol/L (ref 3.5–5.1)
SODIUM: 138 mmol/L (ref 135–145)
Total Bilirubin: 2.1 mg/dL — ABNORMAL HIGH (ref 0.3–1.2)
Total Protein: 6.8 g/dL (ref 6.5–8.1)

## 2016-02-02 LAB — URINALYSIS, ROUTINE W REFLEX MICROSCOPIC
Bilirubin Urine: NEGATIVE
GLUCOSE, UA: NEGATIVE mg/dL
HGB URINE DIPSTICK: NEGATIVE
KETONES UR: NEGATIVE mg/dL
LEUKOCYTES UA: NEGATIVE
Nitrite: NEGATIVE
PH: 5.5 (ref 5.0–8.0)
Protein, ur: NEGATIVE mg/dL
Specific Gravity, Urine: 1.01 (ref 1.005–1.030)

## 2016-02-02 LAB — CBC
HEMATOCRIT: 40.3 % (ref 39.0–52.0)
Hemoglobin: 13.5 g/dL (ref 13.0–17.0)
MCH: 32.3 pg (ref 26.0–34.0)
MCHC: 33.5 g/dL (ref 30.0–36.0)
MCV: 96.4 fL (ref 78.0–100.0)
Platelets: 123 10*3/uL — ABNORMAL LOW (ref 150–400)
RBC: 4.18 MIL/uL — ABNORMAL LOW (ref 4.22–5.81)
RDW: 12.8 % (ref 11.5–15.5)
WBC: 7.4 10*3/uL (ref 4.0–10.5)

## 2016-02-02 LAB — LIPASE, BLOOD: LIPASE: 48 U/L (ref 11–51)

## 2016-02-02 MED ORDER — ONDANSETRON HCL 4 MG/2ML IJ SOLN: 4.0000 mg | Freq: Once | INTRAMUSCULAR | Status: DC

## 2016-02-02 MED ORDER — ONDANSETRON HCL 4 MG PO TABS: 4.0000 mg | ORAL_TABLET | Freq: Four times a day (QID) | ORAL | Status: DC

## 2016-02-02 MED ORDER — SODIUM CHLORIDE 0.9 % IV BOLUS (SEPSIS)
500.0000 mL | Freq: Once | INTRAVENOUS | Status: AC
Start: 2016-02-02 — End: 2016-02-02
  Administered 2016-02-02: 500 mL via INTRAVENOUS

## 2016-02-02 MED ORDER — ONDANSETRON HCL 4 MG/2ML IJ SOLN
4.0000 mg | Freq: Once | INTRAMUSCULAR | Status: AC
  Administered 2016-02-02: 4 mg via INTRAVENOUS
  Filled 2016-02-02: qty 2

## 2016-02-02 MED ORDER — PANTOPRAZOLE SODIUM 40 MG IV SOLR
40.0000 mg | Freq: Once | INTRAVENOUS | Status: AC
  Administered 2016-02-02: 40 mg via INTRAVENOUS
  Filled 2016-02-02: qty 40

## 2016-02-02 MED ORDER — PANTOPRAZOLE SODIUM 20 MG PO TBEC: 20.0000 mg | DELAYED_RELEASE_TABLET | Freq: Two times a day (BID) | ORAL | Status: DC

## 2016-02-02 NOTE — ED Provider Notes (Signed)
CSN: [id number]     Arrival date & time 02/02/16  1357 History   First MD Initiated Contact with Patient 02/02/16 1407     Chief Complaint  Patient presents with  . Emesis     (Consider location/radiation/quality/duration/timing/severity/associated sxs/prior Treatment) HPI..... Nausea and vomiting for 2 months especially after eating. No abdominal pain, bloody stools.  Questionable some pink tinged emesis.   Patient has extensive cardiovascular history. He continues to smoke cigarettes. He has not lost weight. No fever, sweats, chills. Severity is moderate.  Past Medical History  Diagnosis Date  . CHF (congestive heart failure) (HCC)   . Hypertension   . AICD (automatic cardioverter/defibrillator) present   . COPD (chronic obstructive pulmonary disease) (HCC)   . Hypercholesterolemia   . Myocardial infarction [facility](HCC) 2002    "before heart OR"  . Coronary artery disease   . PVD (peripheral vascular disease) (HCC)   . Atrial fibrillation with rapid ventricular response [facility](HCC)    Past Surgical History  Procedure Laterality Date  . Coronary artery bypass graft  2002    'CABG X5"  . Cardiac catheterization  2002  . Angioplasty / stenting femoral Right ~2012  . Cardiac defibrillator placement  01/2008    Medtronic ; Virtuoso VR defibrillator; Model D154VWC; Serial [id number]  . Coronary angioplasty    . Thrombectomy femoral artery Right 04/12/2015    Procedure: THROMBECTOMY Right Axilla-Femoral Gortex Graft;  Surgeon: Larina Earthlyodd F Early, MD;  [facility];  Service: Vascular;  Laterality: Right;  . Patch angioplasty Right 04/12/2015    Procedure: PATCH ANGIOPLASTY Right Femoral Artery;  Surgeon: Larina Earthlyodd F Early, MD;  [facility];  Service: Vascular;  Laterality: Right;   No family history on file. Social History  Substance Use Topics  . Smoking status: Current Every Day Smoker -- 0.50 packs/day for 47 years    Types: Cigarettes  . Smokeless tobacco: Never Used  . Alcohol Use: No     Review of Systems  All other systems reviewed and are negative.     Allergies  Penicillins  Home Medications   Prior to Admission medications   Medication Sig Start Date End Date Taking? Authorizing Provider  amiodarone (PACERONE) 200 MG tablet Take 100 mg by mouth daily.   Yes Historical Provider, MD  atorvastatin (LIPITOR) 80 MG tablet Take 80 mg by mouth daily.   Yes Historical Provider, MD  carvedilol (COREG) 25 MG tablet Take 25 mg by mouth 2 (two) times daily with a meal.   Yes Historical Provider, MD  metFORMIN (GLUCOPHAGE) 500 MG tablet Take 500 mg by mouth 2 (two) times daily with a meal.   Yes Historical Provider, MD  Rivaroxaban (XARELTO) 15 MG TABS tablet Take 15 mg by mouth daily with supper.   Yes Historical Provider, MD  sacubitril-valsartan (ENTRESTO) 49-51 MG Take 1 tablet by mouth 2 (two) times daily.   Yes Historical Provider, MD  torsemide (DEMADEX) 20 MG tablet Take 20 mg by mouth daily.   Yes Historical Provider, MD  ondansetron (ZOFRAN) 4 MG tablet Take 1 tablet (4 mg total) by mouth every 6 (six) hours. 02/02/16   Donnetta HutchingBrian Cathryne Mancebo, MD  pantoprazole (PROTONIX) 20 MG tablet Take 1 tablet (20 mg total) by mouth 2 (two) times daily. 02/02/16   Donnetta HutchingBrian Eun Vermeer, MD   BP 112/49 mmHg  Pulse 58  Temp(Src) 98.3 F (36.8 C) (Oral)  Resp 18  Wt 148 lb (67.132 kg)  SpO2 96% Physical Exam  Constitutional: He is oriented to  person, place, and time. He appears well-developed and well-nourished.  HENT:  Head: Normocephalic and atraumatic.  Eyes: Conjunctivae and EOM are normal. Pupils are equal, round, and reactive to light.  Neck: Normal range of motion. Neck supple.  Cardiovascular: Normal rate and regular rhythm.   Pulmonary/Chest: Effort normal and breath sounds normal.  Abdominal: Soft. Bowel sounds are normal.  Minimal epigastric tenderness.  Genitourinary:  Rectal exam: No masses. Heme negative.  Musculoskeletal: Normal range of motion.  Neurological: He is alert and  oriented to person, place, and time.  Skin: Skin is warm and dry.  Psychiatric: He has a normal mood and affect. His behavior is normal.  Nursing note and vitals reviewed.   ED Course  Procedures (including critical care time) Labs Review Labs Reviewed  COMPREHENSIVE METABOLIC PANEL - Abnormal; Notable for the following:    Glucose, Bld 133 (*)    BUN 23 (*)    Creatinine, Ser 1.68 (*)    Calcium 8.8 (*)    Alkaline Phosphatase 28 (*)    Total Bilirubin 2.1 (*)    GFR calc non Af Amer 42 (*)    GFR calc Af Amer 48 (*)    All other components within normal limits  CBC - Abnormal; Notable for the following:    RBC 4.18 (*)    Platelets 123 (*)    All other components within normal limits  LIPASE, BLOOD  URINALYSIS, ROUTINE W REFLEX MICROSCOPIC (NOT AT [facility])    Imaging Review No results found. I have personally reviewed and evaluated these images and lab results as part of my medical decision-making.   EKG Interpretation None      MDM   Final diagnoses:  Intractable vomiting with nausea, vomiting of unspecified type    Vital signs are stable. Patient feels better after IV fluids, IV Protonix, IV Zofran. Discharge medications Protonix 20 mg and Zofran 8 mg. He has primary care follow-up.    Donnetta Hutching, MD 02/02/16 [phone]

## 2016-02-02 NOTE — Discharge Instructions (Signed)
Medication for a mild ulcer and nausea. Follow-up your primary care doctor. Your kidney function was slightly elevated (creatinine 1.68). Also your total bilirubin which is a liver related test was slightly elevated at 2.1.

## 2016-02-02 NOTE — ED Notes (Signed)
Patient with no complaints at this time. Respirations even and unlabored. Skin warm/dry. Discharge instructions reviewed with patient at this time. Patient given opportunity to voice concerns/ask questions. IV removed per policy and band-aid applied to site. Patient discharged at this time and left Emergency Department with steady gait.  

## 2016-02-02 NOTE — ED Notes (Addendum)
Pt reports on-going emesis and nausea over the past few months. Pt states anytime he eats, he either vomits or has diarrhea. Denies abdominal pain, bloody stools, but reports pink-tinged emesis. Pt has extensive cardiac history and COPD. Pt also hx of stomach ulcer.

## 2016-02-05 LAB — POC OCCULT BLOOD, ED: FECAL OCCULT BLD: NEGATIVE

## 2016-04-04 ENCOUNTER — Other Ambulatory Visit: Payer: Self-pay | Admitting: Vascular Surgery

## 2016-06-07 ENCOUNTER — Telehealth: Payer: Self-pay | Admitting: *Deleted

## 2016-06-07 NOTE — Telephone Encounter (Signed)
Patient's wife Nettie ElmSylvia called to triage yesterday stating that he had a cold,right leg x 2-3 days with increasing pain. He is reportedly unable to apply pressure or walk on this leg. The right foot is swollen.  Patient has history of thrombectomy/ revision of right axillofemoral bypass on 04-12-15 by Dr. Arbie CookeyEarly.  With this history in mind, I advised the patient to [facility]. I sent Dr. Randie Heinzain a message of this as an FYI that patient was coming.   This morning I checked with Dr. Randie Heinzain, who was our call doctor yesterday, and this patient did not come to the ED as directed. Dr. Randie Heinzain also said that he did not receive any phone calls from outside facilities.

## 2016-06-09 ENCOUNTER — Emergency Department (HOSPITAL_COMMUNITY)
Admission: EM | Admit: 2016-06-09 | Discharge: 2016-06-09 | Disposition: A | Discharge status: Home / Self Care | Payer: Medicare Other | Attending: Emergency Medicine | Admitting: Emergency Medicine

## 2016-06-09 ENCOUNTER — Encounter (HOSPITAL_COMMUNITY): Payer: Self-pay | Admitting: Emergency Medicine

## 2016-06-09 DIAGNOSIS — Z7984 Long term (current) use of oral hypoglycemic drugs: Secondary | ICD-10-CM | POA: Insufficient documentation

## 2016-06-09 DIAGNOSIS — I509 Heart failure, unspecified: Secondary | ICD-10-CM | POA: Insufficient documentation

## 2016-06-09 DIAGNOSIS — I2510 Atherosclerotic heart disease of native coronary artery without angina pectoris: Secondary | ICD-10-CM | POA: Insufficient documentation

## 2016-06-09 DIAGNOSIS — R0602 Shortness of breath: Secondary | ICD-10-CM | POA: Diagnosis not present

## 2016-06-09 DIAGNOSIS — F17210 Nicotine dependence, cigarettes, uncomplicated: Secondary | ICD-10-CM | POA: Insufficient documentation

## 2016-06-09 DIAGNOSIS — J449 Chronic obstructive pulmonary disease, unspecified: Secondary | ICD-10-CM | POA: Diagnosis not present

## 2016-06-09 DIAGNOSIS — Z79899 Other long term (current) drug therapy: Secondary | ICD-10-CM | POA: Diagnosis not present

## 2016-06-09 DIAGNOSIS — M79661 Pain in right lower leg: Secondary | ICD-10-CM | POA: Diagnosis present

## 2016-06-09 DIAGNOSIS — I110 Hypertensive heart disease with heart failure: Secondary | ICD-10-CM | POA: Insufficient documentation

## 2016-06-09 DIAGNOSIS — I739 Peripheral vascular disease, unspecified: Secondary | ICD-10-CM | POA: Diagnosis not present

## 2016-06-09 LAB — CBC WITH DIFFERENTIAL/PLATELET
Basophils Absolute: 0 10*3/uL (ref 0.0–0.1)
Basophils Relative: 0 %
EOS ABS: 0.5 10*3/uL (ref 0.0–0.7)
EOS PCT: 5 %
HCT: 42.9 % (ref 39.0–52.0)
Hemoglobin: 14.4 g/dL (ref 13.0–17.0)
LYMPHS ABS: 2.2 10*3/uL (ref 0.7–4.0)
LYMPHS PCT: 23 %
MCH: 34.1 pg — AB (ref 26.0–34.0)
MCHC: 33.6 g/dL (ref 30.0–36.0)
MCV: 101.7 fL — AB (ref 78.0–100.0)
MONO ABS: 0.8 10*3/uL (ref 0.1–1.0)
MONOS PCT: 8 %
Neutro Abs: 6.1 10*3/uL (ref 1.7–7.7)
Neutrophils Relative %: 64 %
PLATELETS: 157 10*3/uL (ref 150–400)
RBC: 4.22 MIL/uL (ref 4.22–5.81)
RDW: 13 % (ref 11.5–15.5)
WBC: 9.7 10*3/uL (ref 4.0–10.5)

## 2016-06-09 LAB — PROTIME-INR
INR: 2.04
PROTHROMBIN TIME: 23.3 s — AB (ref 11.4–15.2)

## 2016-06-09 LAB — COMPREHENSIVE METABOLIC PANEL
ALBUMIN: 4.3 g/dL (ref 3.5–5.0)
ALT: 18 U/L (ref 17–63)
AST: 17 U/L (ref 15–41)
Alkaline Phosphatase: 32 U/L — ABNORMAL LOW (ref 38–126)
Anion gap: 5 (ref 5–15)
BUN: 26 mg/dL — AB (ref 6–20)
CHLORIDE: 103 mmol/L (ref 101–111)
CO2: 31 mmol/L (ref 22–32)
CREATININE: 1.78 mg/dL — AB (ref 0.61–1.24)
Calcium: 9.2 mg/dL (ref 8.9–10.3)
GFR calc Af Amer: 45 mL/min — ABNORMAL LOW (ref >60)
GFR, EST NON AFRICAN AMERICAN: 39 mL/min — AB (ref >60)
GLUCOSE: 90 mg/dL (ref 65–99)
POTASSIUM: 4.7 mmol/L (ref 3.5–5.1)
Sodium: 139 mmol/L (ref 135–145)
Total Bilirubin: 1.6 mg/dL — ABNORMAL HIGH (ref 0.3–1.2)
Total Protein: 7.2 g/dL (ref 6.5–8.1)

## 2016-06-09 MED ORDER — HYDROCODONE-ACETAMINOPHEN 5-325 MG PO TABS: 1.0000 | ORAL_TABLET | ORAL | 0 refills | Status: DC | PRN

## 2016-06-09 NOTE — ED Notes (Signed)
Hobson Bryant at bedside 

## 2016-06-09 NOTE — ED Triage Notes (Signed)
Pt reports vascular surgery on right leg last June and has been having pain in right leg x3-4 days, pain with ambulation.  Pt does have sob at night, no other symptoms at this time.  Pt was sent by Dr. Arbie CookeyEarly.

## 2016-06-09 NOTE — Discharge Instructions (Signed)
Dr. Imogene Burnhen and Dr. Arbie CookeyEarly would like you to call the office on tomorrow to set up an appointment time this week. Please continue your current medications. May use Tylenol for mild pain, may use Norco for more severe pain.This medication may cause drowsiness. Please do not drink, drive, or participate in activity that requires concentration while taking this medication.

## 2016-06-09 NOTE — ED Provider Notes (Signed)
[facility]-EMERGENCY DEPT Provider Note  By signing my name below, I, [name], attest that this documentation has been prepared under the direction and in the presence of Treatment Team:  Attending Provider: Eber Hong, MD Physician Assistant: Ivery Quale, PA-C.  Electronically Signed: Arvilla Market, Medical Scribe. 06/09/16. 11:12 AM.  CSN: [id number] Arrival date & time: 06/09/16  1002  History   Chief Complaint Chief Complaint  Patient presents with  . Leg Pain    HPI Comments: Anthony Meadows is a 65 y.o. male who presents to the Emergency Department complaining of right lower extremity pain mostly from the hip to the right knee. His current pain has been going on for nearly 3 weeks, problem was getting worse today and he came to the ED from instruction from his vasucular specialist for evaluation. The pain is worse with walking short distances, and improved with rest. It is of note hes had multiple stents in the right iliac artery, right superficial femoral artery, and he has also undergone right axillary to femoral to poplatel bypass surgery. He denies recent trauma, or injury to the leg.  The history is provided by the patient. No language interpreter was used.  Leg Pain   The current episode started more than 1 week ago. The pain is present in the right lower leg.    Past Medical History:  Diagnosis Date  . AICD (automatic cardioverter/defibrillator) present   . Atrial fibrillation with rapid ventricular response (HCC)   . CHF (congestive heart failure) (HCC)   . COPD (chronic obstructive pulmonary disease) (HCC)   . Coronary artery disease   . Hypercholesterolemia   . Hypertension   . Myocardial infarction [facility]) 2002   "before heart OR"  . PVD (peripheral vascular disease) [facility])     Patient Active Problem List   Diagnosis Date Noted  . CAD (coronary artery disease), native coronary artery 04/11/2015  . Benign essential HTN 04/11/2015  . Atrial fibrillation (HCC)  04/11/2015  . Chronic systolic heart failure (HCC) 04/11/2015  . Pre-op evaluation   . PVD (peripheral vascular disease) (HCC) 04/10/2015    Past Surgical History:  Procedure Laterality Date  . ANGIOPLASTY / STENTING FEMORAL Right ~2012  . CARDIAC CATHETERIZATION  2002  . CARDIAC DEFIBRILLATOR PLACEMENT  01/2008   Medtronic ; Virtuoso VR defibrillator; Model D154VWC; Serial [id number]  . CORONARY ANGIOPLASTY    . CORONARY ARTERY BYPASS GRAFT  2002   'CABG X5"  . PATCH ANGIOPLASTY Right 04/12/2015   Procedure: PATCH ANGIOPLASTY Right Femoral Artery;  Surgeon: Larina Earthly, MD;  Location: [facility] OR;  Service: Vascular;  Laterality: Right;  . THROMBECTOMY FEMORAL ARTERY Right 04/12/2015   Procedure: THROMBECTOMY Right Axilla-Femoral Gortex Graft;  Surgeon: Larina Earthly, MD;  Location: [facility] OR;  Service: Vascular;  Laterality: Right;    Home Medications    Prior to Admission medications   Medication Sig Start Date End Date Taking? Authorizing Provider  amiodarone (PACERONE) 200 MG tablet Take 100 mg by mouth daily.   Yes Historical Provider, MD  atorvastatin (LIPITOR) 80 MG tablet Take 80 mg by mouth daily.   Yes Historical Provider, MD  carvedilol (COREG) 25 MG tablet Take 25 mg by mouth 2 (two) times daily with a meal.   Yes Historical Provider, MD  metFORMIN (GLUCOPHAGE) 500 MG tablet Take 500 mg by mouth 2 (two) times daily with a meal.   Yes Historical Provider, MD  Rivaroxaban (XARELTO) 15 MG TABS tablet Take 15 mg by mouth daily with  supper.   Yes Historical Provider, MD  sacubitril-valsartan (ENTRESTO) 49-51 MG Take 1 tablet by mouth 2 (two) times daily.   Yes Historical Provider, MD  torsemide (DEMADEX) 20 MG tablet Take 20 mg by mouth daily.   Yes Historical Provider, MD    Family History History reviewed. No pertinent family history.  Social History Social History  Substance Use Topics  . Smoking status: Current Every Day Smoker    Packs/day: 0.50    Years: 47.00    Types:  Cigarettes  . Smokeless tobacco: Never Used  . Alcohol use No     Allergies   Penicillins   Review of Systems Review of Systems  Respiratory: Positive for shortness of breath (PMHx of COPD and smoker).   Musculoskeletal: Positive for arthralgias and myalgias (right leg pain).  All other systems reviewed and are negative.    Physical Exam Updated Vital Signs BP 133/63 (BP Location: Right Arm)   Pulse 60   Temp 97.7 F (36.5 C) (Oral)   Resp 18   Ht 5\' 7"  (1.702 m)   Wt 150 lb (68 kg)   SpO2 100%   BMI 23.49 kg/m   Physical Exam  Constitutional: He appears well-developed and well-nourished. No distress.  HENT:  Head: Normocephalic and atraumatic.  Eyes: Conjunctivae are normal. Pupils are equal, round, and reactive to light.  Neck: Neck supple. Carotid bruit is not present.  Cardiovascular: Normal rate and regular rhythm.   Murmur heard.  Systolic murmur is present with a grade of 2/6  Pulses:      Radial pulses are 2+ on the right side, and 2+ on the left side.  Cap refill <2+ bilaterally for both upper and lower extremities Right femoral pulse weak but palpipal No temp chamges of the right thigh area No palpipal mass behaind the right knee No temp changes of the right or left llower leg, There is a dec in hair growth bilaterally The DP and PT is faint but autoble by dopler  Pulmonary/Chest: Effort normal and breath sounds normal. No respiratory distress. He has no wheezes. He has no rales.  Abdominal: Soft. He exhibits no distension and no pulsatile midline mass.  Musculoskeletal:  Radial pulses are 2+  Neurological: He is alert.  Skin: Skin is warm and dry.  Psychiatric: He has a normal mood and affect. His behavior is normal.  Nursing note and vitals reviewed.    ED Treatments / Results  Labs (all labs ordered are listed, but only abnormal results are displayed) Labs Reviewed  COMPREHENSIVE METABOLIC PANEL  CBC WITH DIFFERENTIAL/PLATELET    PROTIME-INR    DIAGNOSTIC STUDIES: Oxygen Saturation is 100% on RA, nl by my interpretation.    COORDINATION OF CARE: 11:12 AM Discussed treatment plan with pt at bedside and pt agreed to plan.   EKG  EKG Interpretation None       Radiology No results found.  Procedures Procedures (including critical care time)  Medications Ordered in ED Medications - No data to display   Initial Impression / Assessment and Plan / ED Course  I have reviewed the triage vital signs and the nursing notes.  Pertinent labs & imaging results that were available during my care of the patient were reviewed by me and considered in my medical decision making (see chart for details).  Clinical Course  Comment By Time  The pt has hx of vascular procedure to the R leg - has had several days of increasing pain in the R  leg from the hip to the toes when he walks but goes away when he rests - not present at this time.  Has dopplerable (by me) but no palpable pulses at the [facility] - has delayed Cap Refill.  Appearance of leg similar to the L.  Will need to d/w vascular - claudication likely. Eber HongBrian Miller, MD 08/20 1116    *I have reviewed nursing notes, vital signs, and all appropriate lab and imaging results for this patient.**  Final Clinical Impressions(s) / ED Diagnoses  Vital signs within normal limits. The right lower extremity is not cold. There is no loss of motor function. There are Doppler pulses signals present. I discussed the case with Dr. Imogene Burnhen. The patient can be evaluated in the office next week. He [facility] if any emergent changes before his appointment with Dr. Imogene Burnhen.    Final diagnoses:  None    New Prescriptions New Prescriptions   No medications on file    **I personally performed the services described in this documentation, which was scribed in my presence. The recorded information has been  reviewed and is accurate.Ivery Quale*   Chuckie Mccathern, PA-C 06/09/16 1324    Eber HongBrian Miller, MD 06/09/16 [phone]

## 2016-06-09 NOTE — ED Notes (Signed)
US placed at bedside.    Unable to find dorsalis pedis pulse on right foot. US place at bedside for MotorolaHobson Bryant.

## 2016-06-10 ENCOUNTER — Encounter: Payer: Self-pay | Admitting: Vascular Surgery

## 2016-06-13 ENCOUNTER — Ambulatory Visit (INDEPENDENT_AMBULATORY_CARE_PROVIDER_SITE_OTHER)
Admission: RE | Admit: 2016-06-13 | Discharge: 2016-06-13 | Disposition: A | Discharge status: Home / Self Care | Payer: Medicare Other | Source: Ambulatory Visit | Attending: Vascular Surgery | Admitting: Vascular Surgery

## 2016-06-13 ENCOUNTER — Ambulatory Visit (HOSPITAL_COMMUNITY)
Admission: RE | Admit: 2016-06-13 | Discharge: 2016-06-13 | Disposition: A | Discharge status: Home / Self Care | Payer: Medicare Other | Source: Ambulatory Visit | Attending: Vascular Surgery | Admitting: Vascular Surgery

## 2016-06-13 ENCOUNTER — Ambulatory Visit (INDEPENDENT_AMBULATORY_CARE_PROVIDER_SITE_OTHER): Payer: Medicare Other | Admitting: Vascular Surgery

## 2016-06-13 ENCOUNTER — Other Ambulatory Visit: Payer: Self-pay

## 2016-06-13 ENCOUNTER — Other Ambulatory Visit: Payer: Self-pay | Admitting: *Deleted

## 2016-06-13 ENCOUNTER — Encounter: Payer: Self-pay | Admitting: Vascular Surgery

## 2016-06-13 VITALS — BP 135/64 | HR 54 | Temp 97.5°F | Resp 18 | Ht 66.0 in | Wt 151.9 lb

## 2016-06-13 DIAGNOSIS — I739 Peripheral vascular disease, unspecified: Secondary | ICD-10-CM

## 2016-06-13 DIAGNOSIS — T82868A Thrombosis of vascular prosthetic devices, implants and grafts, initial encounter: Secondary | ICD-10-CM | POA: Insufficient documentation

## 2016-06-13 DIAGNOSIS — Y832 Surgical operation with anastomosis, bypass or graft as the cause of abnormal reaction of the patient, or of later complication, without mention of misadventure at the time of the procedure: Secondary | ICD-10-CM | POA: Insufficient documentation

## 2016-06-13 NOTE — Progress Notes (Signed)
Patient name: Anthony Meadows Ferrin MRN: [id number] DOB: 03/01/1951 Sex: male  REASON FOR VISIT: Recurrent Keeney her right leg  HPI: Anthony Meadows Darko is a 65 y.o. male here today for follow-up of right lower extremity ischemia. He has a very complex past history. He had multiple procedures in [place] with stenting of his right iliac system and also right superficial femoral system. These failed and he subsequently underwent right axillofemoral bypass and right femoral-popliteal bypass in 2009. [facility]. He had a critical ischemia of his right leg at that time with occlusion of his axillofemoral bypass. CT angiogram revealed patency of his right femoral-popliteal bypass despite occlusion of the inflow from his axillary to femoral bypass. He underwent thrombectomy and revision of his femoral anastomosis and is done well since that time. He reports that over the past 3 weeks he has had recurrent severe claudication in his right foot with Triniti Gruetzmacher rest pain. He is unable to walk any distance before he has severe pain in his right foot. He does have mild intolerable claudication in his left foot and calf. He does have extensive past cardiac history as well with an AICD implanted. Also chronic atrial fibrillation on Xarelto. Unfortunately he continues to smoke cigarettes.  Past Medical History:  Diagnosis Date  . AICD (automatic cardioverter/defibrillator) present   . Atrial fibrillation with rapid ventricular response (HCC)   . CHF (congestive heart failure) (HCC)   . COPD (chronic obstructive pulmonary disease) (HCC)   . Coronary artery disease   . Hypercholesterolemia   . Hypertension   . Myocardial infarction [facility](HCC) 2002   "before heart OR"  . PVD (peripheral vascular disease) (HCC)     No family history on file.  SOCIAL HISTORY: Social History  Substance Use Topics  . Smoking status: Current Every Day Smoker    Packs/day: 0.25    Years: 47.00    Types:  Cigarettes  . Smokeless tobacco: Never Used  . Alcohol use No    Allergies  Allergen Reactions  . Penicillins Swelling    Current Outpatient Prescriptions  Medication Sig Dispense Refill  . amiodarone (PACERONE) 200 MG tablet Take 100 mg by mouth daily.    Marland Kitchen. atorvastatin (LIPITOR) 80 MG tablet Take 80 mg by mouth daily.    . carvedilol (COREG) 25 MG tablet Take 25 mg by mouth 2 (two) times daily with a meal.    . HYDROcodone-acetaminophen (NORCO/VICODIN) 5-325 MG tablet Take 1 tablet by mouth every 4 (four) hours as needed. 15 tablet 0  . metFORMIN (GLUCOPHAGE) 500 MG tablet Take 500 mg by mouth 2 (two) times daily with a meal.    . Rivaroxaban (XARELTO) 15 MG TABS tablet Take 15 mg by mouth daily with supper.    . sacubitril-valsartan (ENTRESTO) 49-51 MG Take 1 tablet by mouth 2 (two) times daily.    Marland Kitchen. torsemide (DEMADEX) 20 MG tablet Take 20 mg by mouth daily.     No current facility-administered medications for this visit.     REVIEW OF SYSTEMS:  [X]  denotes positive finding, [ ]  denotes negative finding Cardiac  Comments:  Chest pain or chest pressure:    Shortness of breath upon exertion: x   Short of breath when lying flat:    Irregular heart rhythm:        Vascular    Pain in calf, thigh, or hip brought on by ambulation: x   Pain in feet at night that wakes you up  from your sleep:  x   Blood clot in your veins:    Leg swelling:         Pulmonary    Oxygen at home:    Productive cough:     Wheezing:         Neurologic    Sudden weakness in arms or legs:     Sudden numbness in arms or legs:     Sudden onset of difficulty speaking or slurred speech:    Temporary loss of vision in one eye:     Problems with dizziness:         Gastrointestinal    Blood in stool:     Vomited blood:         Genitourinary    Burning when urinating:     Blood in urine:        Psychiatric    Major depression:         Hematologic    Bleeding problems:    Problems with blood  clotting too easily:        Skin    Rashes or ulcers:        Constitutional    Fever or chills:      PHYSICAL EXAM: Vitals:   06/13/16 0951  BP: 135/64  Pulse: (!) 54  Resp: 18  Temp: 97.5 F (36.4 C)  TempSrc: Oral  SpO2: 100%  Weight: 151 lb 14.4 oz (68.9 kg)  Height: 5\' 6"  (1.676 m)    GENERAL: The patient is a well-nourished male, in no acute distress. The vital signs are documented above. CARDIAC: Irregular with no murmur noted VASCULAR: Palpable radial pulses bilaterally. 1-2+ left femoral and absent left popliteal and distal pulses. Absent right femoral and distal pulses. No pulse noted in his axillofemoral and no audible Doppler flow in his axillofemoral bypass PULMONARY: There is good air exchange bilaterally without wheezing or rales. ABDOMEN: Soft and non-tender with normal pitched bowel sounds.  MUSCULOSKELETAL: There are no major deformities or cyanosis. NEUROLOGIC: No focal weakness or paresthesias are detected. SKIN: There are no ulcers or rashes noted. PSYCHIATRIC: The patient has a normal affect.  DATA:   He did undergo noninvasive studies in our office. This showed ankle arm index of 0.47 on the right and 0.67 on the left. Has monophasic signals in both feet in the tibial vessels.  Duplex revealed that his right femoropopliteal was opened despite occlusion of his axillofemoral bypass  MEDICAL ISSUES:  Had long discussion with the patient and his wife present. 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 Also has known renal insufficiency. Have recommended thrombectomy of his axillofemoral bypass. Feel comfortable proceeding with this on the basis of duplex without the risk of iodinated contrast. Will contact his cardiologist in [place] to determine if other preoperative evaluation needs to be done. His need  for surgery is urgent but not emergent since he does not have profound ischemia currently. Does have risk for occlusion of his femoropopliteal bypass which would certainly cause profound ischemia. We have tentatively scheduled him for thrombectomy and revision of his axillary to femoral bypass on September 1 which is one week from tomorrow. He will notify should he develop any worsening symptoms. Will obtain cardiac clearance prior to this.    Gretta BeganEarly, [facility] [place] [employer] [facility] [phone]

## 2016-06-17 ENCOUNTER — Encounter: Payer: Self-pay | Admitting: Vascular Surgery

## 2016-06-17 NOTE — Progress Notes (Addendum)
Anesthesia Chart Review:  Pt is a 65 year old male scheduled for R axillary-femoral thrombectomy on 06/21/2016 with Gretta Beganodd Early, MD.   Cardiologist is Phylliss BobBosh Zakhary, [place] in [place] who has cleared pt for surgery.   PMH includes:  CAD (s/p CABG 2002), MI, ischemic cardiomyopathy, AICD ([employer], inserted 01/26/08 at [facility]; generator change 08/28/15), CHF, atrial fibrillation, HTN, PVD, DM, COPD, hyperlipidemia, CKD. Current smoker. BMI 24.5. S/p R axilla-femoral graft thrombectomy 04/12/15.   Medications include: amiodarone, lipitor, carvedilol, metformin, xarelto, entresto, torsemide. Pt to stop xarelto 3 days before surgery.   Preoperative labs reviewed.  Cr 1.75, BUN 21. Consistent with recent lab results both at [facility] and cardiologist's office. Pt reports he has been referred to nephrologist for eval after surgery.   Nuclear stress test 04/11/15: 1. Large fixed heterogeneous perfusion defect of the anterior, lateral and inferior walls including the apex. No definite reversible ischemia by pharmacologic stress. 2. Global hypokinesis with left ventricular chamber dilatation. 3. Left ventricular ejection fraction 20% 4. High-risk stress test findings   Echo 03/12/16 (from Dr. Oneita JollyZakhary's office):  - EF 40%. Inferior wall akinesis.  - LA dilated. Other chambers within normal limits - Moderate MR - Mild TR - Pulmonary valve not visualized well.   Echo 04/11/15:  - Left ventricle: The cavity size was mildly dilated. Wall   thickness was normal. Systolic function was severely reduced. The   estimated ejection fraction was in the range of 20% to 25%.   Diffuse hypokinesis. - Mitral valve: There was moderate regurgitation. - Left atrium: The atrium was moderately dilated. - Pulmonary arteries: Systolic pressure was mildly increased. PA   peak pressure: 32 mm Hg (S).  If no changes, I anticipate pt can proceed with surgery as scheduled.   Rica Mastngela Charliegh Vasudevan, FNP-BC [facility]/Anesthesiology Phone: [phone] 06/19/2016 4:00 PM

## 2016-06-18 ENCOUNTER — Encounter (HOSPITAL_COMMUNITY): Payer: Self-pay

## 2016-06-18 ENCOUNTER — Encounter (HOSPITAL_COMMUNITY)
Admission: RE | Admit: 2016-06-18 | Discharge: 2016-06-18 | Disposition: A | Discharge status: Home / Self Care | Payer: Medicare Other | Source: Ambulatory Visit | Attending: Vascular Surgery | Admitting: Vascular Surgery

## 2016-06-18 HISTORY — DX: Cardiac arrhythmia, unspecified: I49.9

## 2016-06-18 HISTORY — DX: Anxiety disorder, unspecified: F41.9

## 2016-06-18 HISTORY — DX: Ischemic cardiomyopathy: I25.5

## 2016-06-18 LAB — TYPE AND SCREEN
ABO/RH(D): O POS
Antibody Screen: NEGATIVE

## 2016-06-18 LAB — URINALYSIS, ROUTINE W REFLEX MICROSCOPIC
Bilirubin Urine: NEGATIVE
GLUCOSE, UA: NEGATIVE mg/dL
Hgb urine dipstick: NEGATIVE
Ketones, ur: NEGATIVE mg/dL
LEUKOCYTES UA: NEGATIVE
Nitrite: NEGATIVE
PROTEIN: NEGATIVE mg/dL
Specific Gravity, Urine: 1.016 (ref 1.005–1.030)
pH: 5 (ref 5.0–8.0)

## 2016-06-18 LAB — COMPREHENSIVE METABOLIC PANEL
ALK PHOS: 32 U/L — AB (ref 38–126)
ALT: 16 U/L — AB (ref 17–63)
AST: 18 U/L (ref 15–41)
Albumin: 4.2 g/dL (ref 3.5–5.0)
Anion gap: 7 (ref 5–15)
BUN: 21 mg/dL — ABNORMAL HIGH (ref 6–20)
CALCIUM: 9.2 mg/dL (ref 8.9–10.3)
CO2: 29 mmol/L (ref 22–32)
CREATININE: 1.75 mg/dL — AB (ref 0.61–1.24)
Chloride: 104 mmol/L (ref 101–111)
GFR calc non Af Amer: 40 mL/min — ABNORMAL LOW (ref >60)
GFR, EST AFRICAN AMERICAN: 46 mL/min — AB (ref >60)
Glucose, Bld: 99 mg/dL (ref 65–99)
Potassium: 4.3 mmol/L (ref 3.5–5.1)
SODIUM: 140 mmol/L (ref 135–145)
Total Bilirubin: 1.5 mg/dL — ABNORMAL HIGH (ref 0.3–1.2)
Total Protein: 6.9 g/dL (ref 6.5–8.1)

## 2016-06-18 LAB — PROTIME-INR
INR: 1.09
Prothrombin Time: 14.2 seconds (ref 11.4–15.2)

## 2016-06-18 LAB — CBC
HEMATOCRIT: 45.1 % (ref 39.0–52.0)
HEMOGLOBIN: 14.8 g/dL (ref 13.0–17.0)
MCH: 33.7 pg (ref 26.0–34.0)
MCHC: 32.8 g/dL (ref 30.0–36.0)
MCV: 102.7 fL — AB (ref 78.0–100.0)
Platelets: 163 10*3/uL (ref 150–400)
RBC: 4.39 MIL/uL (ref 4.22–5.81)
RDW: 12.9 % (ref 11.5–15.5)
WBC: 10.2 10*3/uL (ref 4.0–10.5)

## 2016-06-18 LAB — SURGICAL PCR SCREEN
MRSA, PCR: NEGATIVE
STAPHYLOCOCCUS AUREUS: POSITIVE — AB

## 2016-06-18 LAB — GLUCOSE, CAPILLARY: GLUCOSE-CAPILLARY: 121 mg/dL — AB (ref 65–99)

## 2016-06-18 LAB — ABO/RH: ABO/RH(D): O POS

## 2016-06-18 LAB — APTT: aPTT: 36 seconds (ref 24–36)

## 2016-06-18 NOTE — Progress Notes (Signed)
Have requested records from Dr. Dolphus JennyB. Zakhary, in [place] for device order for AICD, also last OV note & also the last test ie- EKG & ECHO.  Pt. Reports that he had a repeat ECHO just 2 months. Ago. Pt. Denies chest complaints. Pt. Denies changes since he last saw MD.

## 2016-06-18 NOTE — Pre-Procedure Instructions (Addendum)
Anthony Meadows  06/18/2016      [id number] [place] - [address] [place] [id number] Phone: [phone] Fax: [phone]    Your procedure is scheduled on 06/21/2016  [facility] Admitting at 5:30 A.M.  Call this number if you have problems the morning of surgery:  [phone]   Remember:  Do not eat food or drink liquids after midnight.  Take these medicines the morning of surgery with A SIP OF WATER: Carvedilol, Amiodarone & {Hydrocodone is OK if you need it)  Do not wear jewelry  Do not wear lotions, powders, or perfumes, or deoderant.          .  Men may shave face and neck.  Do not bring valuables to the hospital.  [facility] is not responsible for any belongings or valuables.  Contacts, dentures or bridgework may not be worn into surgery.  Leave your suitcase in the car.  After surgery it may be brought to your room.  For patients admitted to the hospital, discharge time will be determined by your treatment team.  Patients discharged the day of surgery will not be allowed to drive home.   Name and phone number of your driver:   With family Special instructions:     How to Manage Your Diabetes Before and After Surgery  Why is it important to control my blood sugar before and after surgery? . Improving blood sugar levels before and after surgery helps healing and can limit problems. . A way of improving blood sugar control is eating a healthy diet by: o  Eating less sugar and carbohydrates o  Increasing activity/exercise o  Talking with your doctor about reaching your blood sugar goals . High blood sugars (greater than 180 mg/dL) can raise your risk of infections and slow your recovery, so you will need to focus on controlling your diabetes during the weeks before surgery. . Make sure that the doctor who takes care of your diabetes knows about your planned surgery including the date and location.  How do I manage  my blood sugar before surgery? . Check your blood sugar at least 4 times a day, starting 2 days before surgery, to make sure that the level is not too high or low. o Check your blood sugar the morning of your surgery when you wake up and every 2 hours until you get to the Short Stay unit. . If your blood sugar is less than 70 mg/dL, you will need to treat for low blood sugar: o Do not take insulin. o Treat a low blood sugar (less than 70 mg/dL) with  cup of clear juice (cranberry or apple), 4 glucose tablets, OR glucose gel. o Recheck blood sugar in 15 minutes after treatment (to make sure it is greater than 70 mg/dL). If your blood sugar is not greater than 70 mg/dL on recheck, call [phone] for further instructions. . Report your blood sugar to the short stay nurse when you get to Short Stay.  . If you are admitted to the hospital after surgery: o Your blood sugar will be checked by the staff and you will probably be given insulin after surgery (instead of oral diabetes medicines) to make sure you have good blood sugar levels. o The goal for blood sugar control after surgery is 80-180 mg/dL.              WHAT DO I DO ABOUT MY DIABETES MEDICATION?   .Marland Kitchen  Do not take oral diabetes medicines (pills) the morning of surgery.  Marland Kitchen STAY OFF THE XARELTO.         Other Instructions:  Special Instructions: [place] - Preparing for Surgery  Before surgery, you can play an important role.  Because skin is not sterile, your skin needs to be as free of germs as possible.  You can reduce the number of germs on you skin by washing with CHG (chlorahexidine gluconate) soap before surgery.  CHG is an antiseptic cleaner which kills germs and bonds with the skin to continue killing germs even after washing.  Please DO NOT use if you have an allergy to CHG or antibacterial soaps.  If your skin becomes reddened/irritated stop using the CHG and inform your nurse when you arrive at Short  Stay.  Do not shave (including legs and underarms) for at least 48 hours prior to the first CHG shower.  You may shave your face.  Please follow these instructions carefully:   1.  Shower with CHG Soap the night before surgery and the  morning of Surgery.  2.  If you choose to wash your hair, wash your hair first as usual with your  normal shampoo.  3.  After you shampoo, rinse your hair and body thoroughly to remove the  Shampoo.  4.  Use CHG as you would any other liquid soap.  You can apply chg directly to the skin and wash gently with scrungie or a clean washcloth.  5.  Apply the CHG Soap to your body ONLY FROM THE NECK DOWN.    Do not use on open wounds or open sores.  Avoid contact with your eyes, ears, mouth and genitals (private parts).  Wash genitals (private parts)   with your normal soap.  6.  Wash thoroughly, paying special attention to the area where your surgery will be performed.  7.  Thoroughly rinse your body with warm water from the neck down.  8.  DO NOT shower/wash with your normal soap after using and rinsing off   the CHG Soap.  9.  Pat yourself dry with a clean towel.            10.  Wear clean pajamas.            11.  Place clean sheets on your bed the night of your first shower and do not sleep with pets.  Day of Surgery  Do not apply any lotions/deodorants the morning of surgery.  Please wear clean clothes to the hospital/surgery center.          Patient Signature:  Date:   Nurse Signature:  Date:   Reviewed and Endorsed by [facility] Patient Education Committee, August 2015  Please read over the following fact sheets that you were given. Pain Booklet, Coughing and Deep Breathing, MRSA Information and Surgical Site Infection Prevention

## 2016-06-18 NOTE — Progress Notes (Signed)
I called a prescription for Mupirocin ointment to [facility], [place].

## 2016-06-19 ENCOUNTER — Encounter (HOSPITAL_COMMUNITY): Payer: Self-pay

## 2016-06-19 LAB — HEMOGLOBIN A1C
Hgb A1c MFr Bld: 5.7 % — ABNORMAL HIGH (ref 4.8–5.6)
Mean Plasma Glucose: 117 mg/dL

## 2016-06-20 MED ORDER — VANCOMYCIN HCL IN DEXTROSE 1-5 GM/200ML-% IV SOLN
1000.0000 mg | INTRAVENOUS | Status: AC
  Administered 2016-06-21: 1000 mg via INTRAVENOUS
  Filled 2016-06-20: qty 200

## 2016-06-20 NOTE — Progress Notes (Signed)
Notified  MARCIA [employer] REP OF PATIENT'S SURGERY 06/21/16 @0730 .  MARCIA STATED A REP WOULD BE THERE PROBABLY AROUND 0700.

## 2016-06-21 ENCOUNTER — Encounter (HOSPITAL_COMMUNITY): Payer: Self-pay | Admitting: *Deleted

## 2016-06-21 ENCOUNTER — Inpatient Hospital Stay (HOSPITAL_COMMUNITY)
Admission: RE | Admit: 2016-06-21 | Discharge: 2016-06-22 | DRG: 253 | Disposition: A | Discharge status: Home / Self Care | Payer: Medicare Other | Source: Ambulatory Visit | Attending: Vascular Surgery | Admitting: Vascular Surgery

## 2016-06-21 ENCOUNTER — Inpatient Hospital Stay (HOSPITAL_COMMUNITY): Payer: Medicare Other | Admitting: Vascular Surgery

## 2016-06-21 ENCOUNTER — Telehealth: Payer: Self-pay | Admitting: Vascular Surgery

## 2016-06-21 ENCOUNTER — Encounter (HOSPITAL_COMMUNITY)
Admission: RE | Disposition: A | Discharge status: Home / Self Care | Payer: Self-pay | Source: Ambulatory Visit | Attending: Vascular Surgery

## 2016-06-21 DIAGNOSIS — I130 Hypertensive heart and chronic kidney disease with heart failure and stage 1 through stage 4 chronic kidney disease, or unspecified chronic kidney disease: Secondary | ICD-10-CM | POA: Diagnosis present

## 2016-06-21 DIAGNOSIS — F17210 Nicotine dependence, cigarettes, uncomplicated: Secondary | ICD-10-CM | POA: Diagnosis present

## 2016-06-21 DIAGNOSIS — I482 Chronic atrial fibrillation: Secondary | ICD-10-CM | POA: Diagnosis present

## 2016-06-21 DIAGNOSIS — E785 Hyperlipidemia, unspecified: Secondary | ICD-10-CM

## 2016-06-21 DIAGNOSIS — Z95820 Peripheral vascular angioplasty status with implants and grafts: Secondary | ICD-10-CM

## 2016-06-21 DIAGNOSIS — Y832 Surgical operation with anastomosis, bypass or graft as the cause of abnormal reaction of the patient, or of later complication, without mention of misadventure at the time of the procedure: Secondary | ICD-10-CM | POA: Diagnosis present

## 2016-06-21 DIAGNOSIS — M79671 Pain in right foot: Secondary | ICD-10-CM | POA: Diagnosis present

## 2016-06-21 DIAGNOSIS — J449 Chronic obstructive pulmonary disease, unspecified: Secondary | ICD-10-CM | POA: Diagnosis present

## 2016-06-21 DIAGNOSIS — Z79899 Other long term (current) drug therapy: Secondary | ICD-10-CM | POA: Diagnosis not present

## 2016-06-21 DIAGNOSIS — I252 Old myocardial infarction: Secondary | ICD-10-CM | POA: Diagnosis not present

## 2016-06-21 DIAGNOSIS — Z7901 Long term (current) use of anticoagulants: Secondary | ICD-10-CM | POA: Diagnosis not present

## 2016-06-21 DIAGNOSIS — N189 Chronic kidney disease, unspecified: Secondary | ICD-10-CM | POA: Diagnosis not present

## 2016-06-21 DIAGNOSIS — I509 Heart failure, unspecified: Secondary | ICD-10-CM | POA: Diagnosis present

## 2016-06-21 DIAGNOSIS — Z7984 Long term (current) use of oral hypoglycemic drugs: Secondary | ICD-10-CM | POA: Diagnosis not present

## 2016-06-21 DIAGNOSIS — I255 Ischemic cardiomyopathy: Secondary | ICD-10-CM | POA: Diagnosis not present

## 2016-06-21 DIAGNOSIS — I998 Other disorder of circulatory system: Secondary | ICD-10-CM

## 2016-06-21 DIAGNOSIS — Z951 Presence of aortocoronary bypass graft: Secondary | ICD-10-CM | POA: Diagnosis not present

## 2016-06-21 DIAGNOSIS — Z95810 Presence of automatic (implantable) cardiac defibrillator: Secondary | ICD-10-CM

## 2016-06-21 DIAGNOSIS — T82868A Thrombosis of vascular prosthetic devices, implants and grafts, initial encounter: Secondary | ICD-10-CM | POA: Diagnosis not present

## 2016-06-21 DIAGNOSIS — E1151 Type 2 diabetes mellitus with diabetic peripheral angiopathy without gangrene: Secondary | ICD-10-CM | POA: Diagnosis present

## 2016-06-21 DIAGNOSIS — I2510 Atherosclerotic heart disease of native coronary artery without angina pectoris: Secondary | ICD-10-CM | POA: Diagnosis present

## 2016-06-21 DIAGNOSIS — E1122 Type 2 diabetes mellitus with diabetic chronic kidney disease: Secondary | ICD-10-CM | POA: Diagnosis present

## 2016-06-21 HISTORY — PX: THROMBECTOMY FEMORAL ARTERY: SHX6406

## 2016-06-21 LAB — GLUCOSE, CAPILLARY
GLUCOSE-CAPILLARY: 103 mg/dL — AB (ref 65–99)
GLUCOSE-CAPILLARY: 147 mg/dL — AB (ref 65–99)

## 2016-06-21 SURGERY — THROMBECTOMY, ARTERY, FEMORAL
Anesthesia: General | Laterality: Right

## 2016-06-21 MED ORDER — LIDOCAINE HCL (CARDIAC) 20 MG/ML IV SOLN
INTRAVENOUS | Status: DC | PRN
  Administered 2016-06-21: 80 mg via INTRAVENOUS
  Administered 2016-06-21: 20 mg via INTRAVENOUS

## 2016-06-21 MED ORDER — METOPROLOL TARTRATE 5 MG/5ML IV SOLN: 2.0000 mg | INTRAVENOUS | Status: DC | PRN

## 2016-06-21 MED ORDER — ACETAMINOPHEN 325 MG PO TABS: 325.0000 mg | ORAL_TABLET | ORAL | Status: DC | PRN

## 2016-06-21 MED ORDER — HYDROMORPHONE HCL 1 MG/ML IJ SOLN
INTRAMUSCULAR | Status: AC
  Filled 2016-06-21: qty 1

## 2016-06-21 MED ORDER — HEPARIN SODIUM (PORCINE) 1000 UNIT/ML IJ SOLN
INTRAMUSCULAR | Status: AC
  Filled 2016-06-21: qty 1

## 2016-06-21 MED ORDER — FENTANYL CITRATE (PF) 100 MCG/2ML IJ SOLN
INTRAMUSCULAR | Status: AC
  Filled 2016-06-21: qty 2

## 2016-06-21 MED ORDER — LORAZEPAM 0.5 MG PO TABS: 0.5000 mg | ORAL_TABLET | Freq: Two times a day (BID) | ORAL | Status: DC | PRN

## 2016-06-21 MED ORDER — CARVEDILOL 25 MG PO TABS
25.0000 mg | ORAL_TABLET | Freq: Two times a day (BID) | ORAL | Status: DC
  Administered 2016-06-21 – 2016-06-22 (×2): 25 mg via ORAL
  Filled 2016-06-21 (×2): qty 1

## 2016-06-21 MED ORDER — OXYCODONE HCL 5 MG PO TABS: 5.0000 mg | ORAL_TABLET | Freq: Once | ORAL | Status: DC | PRN

## 2016-06-21 MED ORDER — NEOSTIGMINE METHYLSULFATE 10 MG/10ML IV SOLN
INTRAVENOUS | Status: DC | PRN
  Administered 2016-06-21: 4 mg via INTRAVENOUS

## 2016-06-21 MED ORDER — SODIUM CHLORIDE 0.9 % IV SOLN
INTRAVENOUS | Status: DC
  Administered 2016-06-21: 12:00:00 via INTRAVENOUS
  Administered 2016-06-21: 50 mL/h via INTRAVENOUS

## 2016-06-21 MED ORDER — ROCURONIUM BROMIDE 10 MG/ML (PF) SYRINGE
PREFILLED_SYRINGE | INTRAVENOUS | Status: AC
  Filled 2016-06-21: qty 10

## 2016-06-21 MED ORDER — POTASSIUM CHLORIDE CRYS ER 20 MEQ PO TBCR: 20.0000 meq | EXTENDED_RELEASE_TABLET | Freq: Every day | ORAL | Status: DC | PRN

## 2016-06-21 MED ORDER — MUPIROCIN 2 % EX OINT
1.0000 "application " | TOPICAL_OINTMENT | Freq: Once | CUTANEOUS | Status: AC
  Administered 2016-06-21: 1 via TOPICAL

## 2016-06-21 MED ORDER — MAGNESIUM SULFATE 2 GM/50ML IV SOLN: 2.0000 g | Freq: Every day | INTRAVENOUS | Status: DC | PRN

## 2016-06-21 MED ORDER — 0.9 % SODIUM CHLORIDE (POUR BTL) OPTIME
TOPICAL | Status: DC | PRN
  Administered 2016-06-21: 2000 mL

## 2016-06-21 MED ORDER — SODIUM CHLORIDE 0.9 % IV SOLN
INTRAVENOUS | Status: DC
  Administered 2016-06-21: 07:00:00 via INTRAVENOUS

## 2016-06-21 MED ORDER — ARTIFICIAL TEARS OP OINT
TOPICAL_OINTMENT | OPHTHALMIC | Status: DC | PRN
  Administered 2016-06-21: 1 via OPHTHALMIC

## 2016-06-21 MED ORDER — PROPOFOL 10 MG/ML IV BOLUS
INTRAVENOUS | Status: DC | PRN
  Administered 2016-06-21: 50 mg via INTRAVENOUS
  Administered 2016-06-21: 150 mg via INTRAVENOUS

## 2016-06-21 MED ORDER — CHLORHEXIDINE GLUCONATE 4 % EX LIQD: 60.0000 mL | Freq: Once | CUTANEOUS | Status: DC

## 2016-06-21 MED ORDER — MIDAZOLAM HCL 2 MG/2ML IJ SOLN
INTRAMUSCULAR | Status: AC
  Filled 2016-06-21: qty 2

## 2016-06-21 MED ORDER — HYDRALAZINE HCL 20 MG/ML IJ SOLN: 5.0000 mg | INTRAMUSCULAR | Status: DC | PRN

## 2016-06-21 MED ORDER — BISMUTH SUBSALICYLATE 262 MG/15ML PO SUSP
30.0000 mL | Freq: Four times a day (QID) | ORAL | Status: DC | PRN
  Filled 2016-06-21: qty 118

## 2016-06-21 MED ORDER — MUPIROCIN 2 % EX OINT
TOPICAL_OINTMENT | CUTANEOUS | Status: AC
  Filled 2016-06-21: qty 22

## 2016-06-21 MED ORDER — ONDANSETRON HCL 4 MG/2ML IJ SOLN: 4.0000 mg | Freq: Four times a day (QID) | INTRAMUSCULAR | Status: DC | PRN

## 2016-06-21 MED ORDER — OXYCODONE HCL 5 MG/5ML PO SOLN: 5.0000 mg | Freq: Once | ORAL | Status: DC | PRN

## 2016-06-21 MED ORDER — VANCOMYCIN HCL IN DEXTROSE 1-5 GM/200ML-% IV SOLN
1000.0000 mg | Freq: Two times a day (BID) | INTRAVENOUS | Status: AC
  Administered 2016-06-21 – 2016-06-22 (×2): 1000 mg via INTRAVENOUS
  Filled 2016-06-21 (×2): qty 200

## 2016-06-21 MED ORDER — GUAIFENESIN-DM 100-10 MG/5ML PO SYRP: 15.0000 mL | ORAL_SOLUTION | ORAL | Status: DC | PRN

## 2016-06-21 MED ORDER — PANTOPRAZOLE SODIUM 40 MG PO TBEC
40.0000 mg | DELAYED_RELEASE_TABLET | Freq: Every day | ORAL | Status: DC
  Administered 2016-06-21 – 2016-06-22 (×2): 40 mg via ORAL
  Filled 2016-06-21 (×2): qty 1

## 2016-06-21 MED ORDER — ARTIFICIAL TEARS OP OINT
TOPICAL_OINTMENT | OPHTHALMIC | Status: AC
  Filled 2016-06-21: qty 3.5

## 2016-06-21 MED ORDER — ALBUTEROL SULFATE (2.5 MG/3ML) 0.083% IN NEBU: 3.0000 mL | INHALATION_SOLUTION | Freq: Four times a day (QID) | RESPIRATORY_TRACT | Status: DC | PRN

## 2016-06-21 MED ORDER — PHENOL 1.4 % MT LIQD: 1.0000 | OROMUCOSAL | Status: DC | PRN

## 2016-06-21 MED ORDER — FENTANYL CITRATE (PF) 100 MCG/2ML IJ SOLN
INTRAMUSCULAR | Status: DC | PRN
  Administered 2016-06-21 (×2): 50 ug via INTRAVENOUS
  Administered 2016-06-21: 100 ug via INTRAVENOUS

## 2016-06-21 MED ORDER — PROPOFOL 10 MG/ML IV BOLUS
INTRAVENOUS | Status: AC
  Filled 2016-06-21: qty 40

## 2016-06-21 MED ORDER — DOCUSATE SODIUM 100 MG PO CAPS
100.0000 mg | ORAL_CAPSULE | Freq: Every day | ORAL | Status: DC
  Administered 2016-06-22: 100 mg via ORAL
  Filled 2016-06-21: qty 1

## 2016-06-21 MED ORDER — MIDAZOLAM HCL 5 MG/5ML IJ SOLN
INTRAMUSCULAR | Status: DC | PRN
  Administered 2016-06-21: 2 mg via INTRAVENOUS

## 2016-06-21 MED ORDER — LACTATED RINGERS IV SOLN: INTRAVENOUS | Status: DC | PRN

## 2016-06-21 MED ORDER — FENTANYL CITRATE (PF) 100 MCG/2ML IJ SOLN
INTRAMUSCULAR | Status: AC
  Filled 2016-06-21: qty 4

## 2016-06-21 MED ORDER — SODIUM CHLORIDE 0.9 % IV SOLN: 500.0000 mL | Freq: Once | INTRAVENOUS | Status: DC | PRN

## 2016-06-21 MED ORDER — HYDROCODONE-ACETAMINOPHEN 5-325 MG PO TABS
1.0000 | ORAL_TABLET | ORAL | Status: DC | PRN
Start: 2016-06-21 — End: 2016-06-22
  Administered 2016-06-21: 2 via ORAL
  Filled 2016-06-21: qty 2

## 2016-06-21 MED ORDER — HYDROMORPHONE HCL 1 MG/ML IJ SOLN
0.2500 mg | INTRAMUSCULAR | Status: DC | PRN
  Administered 2016-06-21 (×2): 0.5 mg via INTRAVENOUS

## 2016-06-21 MED ORDER — LIDOCAINE 2% (20 MG/ML) 5 ML SYRINGE
INTRAMUSCULAR | Status: AC
  Filled 2016-06-21: qty 5

## 2016-06-21 MED ORDER — PHENYLEPHRINE HCL 10 MG/ML IJ SOLN
INTRAMUSCULAR | Status: DC | PRN
  Administered 2016-06-21: 80 ug via INTRAVENOUS

## 2016-06-21 MED ORDER — ONDANSETRON HCL 4 MG/2ML IJ SOLN
INTRAMUSCULAR | Status: AC
  Filled 2016-06-21: qty 2

## 2016-06-21 MED ORDER — EPHEDRINE SULFATE 50 MG/ML IJ SOLN
INTRAMUSCULAR | Status: DC | PRN
  Administered 2016-06-21: 10 mg via INTRAVENOUS
  Administered 2016-06-21: 5 mg via INTRAVENOUS
  Administered 2016-06-21: 10 mg via INTRAVENOUS

## 2016-06-21 MED ORDER — ALBUMIN HUMAN 5 % IV SOLN
INTRAVENOUS | Status: DC | PRN
  Administered 2016-06-21: 08:00:00 via INTRAVENOUS

## 2016-06-21 MED ORDER — SODIUM CHLORIDE 0.9 % IV SOLN
INTRAVENOUS | Status: DC | PRN
  Administered 2016-06-21: 09:00:00

## 2016-06-21 MED ORDER — HEPARIN SODIUM (PORCINE) 1000 UNIT/ML IJ SOLN
INTRAMUSCULAR | Status: DC | PRN
  Administered 2016-06-21: 7000 [IU] via INTRAVENOUS

## 2016-06-21 MED ORDER — ACETAMINOPHEN 325 MG RE SUPP: 325.0000 mg | RECTAL | Status: DC | PRN

## 2016-06-21 MED ORDER — PROTAMINE SULFATE 10 MG/ML IV SOLN
INTRAVENOUS | Status: DC | PRN
  Administered 2016-06-21: 50 mg via INTRAVENOUS

## 2016-06-21 MED ORDER — EPHEDRINE 5 MG/ML INJ
INTRAVENOUS | Status: AC
  Filled 2016-06-21: qty 10

## 2016-06-21 MED ORDER — GLYCOPYRROLATE 0.2 MG/ML IJ SOLN
INTRAMUSCULAR | Status: DC | PRN
  Administered 2016-06-21: 0.6 mg via INTRAVENOUS
  Administered 2016-06-21: 0.3 mg via INTRAVENOUS

## 2016-06-21 MED ORDER — LABETALOL HCL 5 MG/ML IV SOLN: 10.0000 mg | INTRAVENOUS | Status: DC | PRN

## 2016-06-21 MED ORDER — MORPHINE SULFATE (PF) 2 MG/ML IV SOLN: 2.0000 mg | INTRAVENOUS | Status: DC | PRN

## 2016-06-21 MED ORDER — ALUM & MAG HYDROXIDE-SIMETH 200-200-20 MG/5ML PO SUSP: 15.0000 mL | ORAL | Status: DC | PRN

## 2016-06-21 MED ORDER — AMIODARONE HCL 200 MG PO TABS
100.0000 mg | ORAL_TABLET | Freq: Every day | ORAL | Status: DC
  Administered 2016-06-21 – 2016-06-22 (×2): 100 mg via ORAL
  Filled 2016-06-21 (×2): qty 1

## 2016-06-21 MED ORDER — ONDANSETRON HCL 4 MG/2ML IJ SOLN: 4.0000 mg | Freq: Once | INTRAMUSCULAR | Status: DC | PRN

## 2016-06-21 MED ORDER — PHENYLEPHRINE HCL 10 MG/ML IJ SOLN
INTRAVENOUS | Status: DC | PRN
  Administered 2016-06-21: 25 ug/min via INTRAVENOUS

## 2016-06-21 MED ORDER — LACTATED RINGERS IV SOLN
INTRAVENOUS | Status: DC | PRN
  Administered 2016-06-21: 09:00:00 via INTRAVENOUS

## 2016-06-21 MED ORDER — DIPHENHYDRAMINE HCL 50 MG/ML IJ SOLN
INTRAMUSCULAR | Status: DC | PRN
  Administered 2016-06-21: 50 mg via INTRAVENOUS

## 2016-06-21 MED ORDER — SUCCINYLCHOLINE CHLORIDE 200 MG/10ML IV SOSY
PREFILLED_SYRINGE | INTRAVENOUS | Status: AC
  Filled 2016-06-21: qty 10

## 2016-06-21 MED ORDER — OXYCODONE-ACETAMINOPHEN 5-325 MG PO TABS: 1.0000 | ORAL_TABLET | ORAL | Status: DC | PRN

## 2016-06-21 MED ORDER — ROCURONIUM BROMIDE 100 MG/10ML IV SOLN
INTRAVENOUS | Status: DC | PRN
  Administered 2016-06-21: 50 mg via INTRAVENOUS

## 2016-06-21 MED ORDER — MEPERIDINE HCL 25 MG/ML IJ SOLN: 6.2500 mg | INTRAMUSCULAR | Status: DC | PRN

## 2016-06-21 MED ORDER — SUCCINYLCHOLINE CHLORIDE 20 MG/ML IJ SOLN
INTRAMUSCULAR | Status: DC | PRN
  Administered 2016-06-21: 120 mg via INTRAVENOUS

## 2016-06-21 MED ORDER — ONDANSETRON HCL 4 MG/2ML IJ SOLN
INTRAMUSCULAR | Status: DC | PRN
  Administered 2016-06-21: 4 mg via INTRAVENOUS

## 2016-06-21 MED ORDER — ATORVASTATIN CALCIUM 80 MG PO TABS
80.0000 mg | ORAL_TABLET | Freq: Every day | ORAL | Status: DC
  Administered 2016-06-21: 80 mg via ORAL
  Filled 2016-06-21: qty 1

## 2016-06-21 MED ORDER — PROTAMINE SULFATE 10 MG/ML IV SOLN
INTRAVENOUS | Status: AC
  Filled 2016-06-21: qty 25

## 2016-06-21 SURGICAL SUPPLY — 47 items
BENZOIN TINCTURE PRP APPL 2/3 (GAUZE/BANDAGES/DRESSINGS) ×2 IMPLANT
CANISTER SUCTION 2500CC (MISCELLANEOUS) ×2 IMPLANT
CANNULA VESSEL 3MM 2 BLNT TIP (CANNULA) ×4 IMPLANT
CATH EMB 4FR 80CM (CATHETERS) ×2 IMPLANT
CLIP LIGATING EXTRA MED SLVR (CLIP) ×4 IMPLANT
CLIP LIGATING EXTRA SM BLUE (MISCELLANEOUS) ×4 IMPLANT
DRAIN SNY 10X20 3/4 PERF (WOUND CARE) IMPLANT
DRAPE INCISE IOBAN 66X45 STRL (DRAPES) ×2 IMPLANT
DRAPE X-RAY CASS 24X20 (DRAPES) IMPLANT
DRSG COVADERM 4X8 (GAUZE/BANDAGES/DRESSINGS) ×2 IMPLANT
ELECT REM PT RETURN 9FT ADLT (ELECTROSURGICAL) ×2
ELECTRODE REM PT RTRN 9FT ADLT (ELECTROSURGICAL) ×1 IMPLANT
EVACUATOR SILICONE 100CC (DRAIN) IMPLANT
GAUZE SPONGE 4X4 12PLY STRL (GAUZE/BANDAGES/DRESSINGS) ×2 IMPLANT
GLOVE BIO SURGEON STRL SZ 6.5 (GLOVE) ×2 IMPLANT
GLOVE BIO SURGEON STRL SZ7.5 (GLOVE) ×2 IMPLANT
GLOVE BIOGEL PI IND STRL 6.5 (GLOVE) ×3 IMPLANT
GLOVE BIOGEL PI INDICATOR 6.5 (GLOVE) ×3
GLOVE ECLIPSE 7.0 STRL STRAW (GLOVE) ×4 IMPLANT
GLOVE SS BIOGEL STRL SZ 7.5 (GLOVE) ×1 IMPLANT
GLOVE SUPERSENSE BIOGEL SZ 7.5 (GLOVE) ×1
GLOVE SURG SS PI 6.5 STRL IVOR (GLOVE) ×6 IMPLANT
GOWN STRL REUS W/ TWL LRG LVL3 (GOWN DISPOSABLE) ×4 IMPLANT
GOWN STRL REUS W/ TWL XL LVL3 (GOWN DISPOSABLE) ×1 IMPLANT
GOWN STRL REUS W/TWL LRG LVL3 (GOWN DISPOSABLE) ×4
GOWN STRL REUS W/TWL XL LVL3 (GOWN DISPOSABLE) ×1
INSERT FOGARTY SM (MISCELLANEOUS) ×2 IMPLANT
KIT BASIN OR (CUSTOM PROCEDURE TRAY) ×2 IMPLANT
KIT ROOM TURNOVER OR (KITS) ×2 IMPLANT
NS IRRIG 1000ML POUR BTL (IV SOLUTION) ×4 IMPLANT
PACK PERIPHERAL VASCULAR (CUSTOM PROCEDURE TRAY) ×2 IMPLANT
PAD ARMBOARD 7.5X6 YLW CONV (MISCELLANEOUS) ×4 IMPLANT
SET COLLECT BLD 21X3/4 12 (NEEDLE) IMPLANT
STAPLER VISISTAT 35W (STAPLE) IMPLANT
STOPCOCK 4 WAY LG BORE MALE ST (IV SETS) IMPLANT
STRIP CLOSURE SKIN 1/2X4 (GAUZE/BANDAGES/DRESSINGS) ×2 IMPLANT
SUT PROLENE 5 0 C 1 24 (SUTURE) ×2 IMPLANT
SUT PROLENE 6 0 CC (SUTURE) ×4 IMPLANT
SUT SILK 2 0 FS (SUTURE) IMPLANT
SUT SILK 2 0 SH (SUTURE) IMPLANT
SUT VIC AB 2-0 CTX 36 (SUTURE) ×2 IMPLANT
SUT VIC AB 3-0 SH 27 (SUTURE) ×3
SUT VIC AB 3-0 SH 27X BRD (SUTURE) ×3 IMPLANT
SYR 3ML LL SCALE MARK (SYRINGE) ×2 IMPLANT
TRAY FOLEY W/METER SILVER 16FR (SET/KITS/TRAYS/PACK) ×2 IMPLANT
TUBING EXTENTION W/L.L. (IV SETS) IMPLANT
WATER STERILE IRR 1000ML POUR (IV SOLUTION) ×2 IMPLANT

## 2016-06-21 NOTE — Evaluation (Signed)
Occupational Therapy Evaluation Patient Details Name: Anthony Meadows MRN: [id number] DOB: Mar 21, 1951 Today's Date: 06/21/2016    History of Present Illness 65 y.o. male s/p R axillary-femoral thrombectomy. PMH significant for AICD, afib, CHF, COPD, CAD, HTN, MI, and PVD.   Clinical Impression   PTA, pt was independent with all ADLs and mobility. Pt currently requires min guard assist for standing ADLs and basic transfers due mild unsteadiness when ambulating. Educated pt on compensatory strategies for ADLs and availability of DME, however pt declined need for any at this time. Pt plans to d/c home with 24/7 assistance from his family. Pt will benefit from continued acute OT to increase independence and safety with ADLs and mobility to allow for safe discharge home. No OT follow up or DME recommended at this time.    Follow Up Recommendations  No OT follow up;Supervision - Intermittent    Equipment Recommendations  None recommended by OT    Recommendations for Other Services       Precautions / Restrictions Precautions Precautions: Fall Restrictions Weight Bearing Restrictions: No      Mobility Bed Mobility Overal bed mobility: Modified Independent                Transfers Overall transfer level: Needs assistance   Transfers: Sit to/from Stand Sit to Stand: Supervision         General transfer comment: Supervision for safety. Pt a bit impulsive and needed cues to wait for therapist to manage multiple lines/leads attached to him.     Balance Overall balance assessment: Needs assistance Sitting-balance support: No upper extremity supported;Feet supported Sitting balance-Leahy Scale: Normal     Standing balance support: No upper extremity supported;During functional activity Standing balance-Leahy Scale: Good Standing balance comment: 1 minor LOB but did not require assistance to regain balance                            ADL Overall ADL's : Needs  assistance/impaired Eating/Feeding: Set up;Sitting   Grooming: Wash/dry hands;Supervision/safety;Standing           Upper Body Dressing : Supervision/safety;Sitting   Lower Body Dressing: Supervision/safety;Sit to/from stand   Toilet Transfer: Supervision/safety;Ambulation;Regular Toilet;Cueing for safety   Toileting- Clothing Manipulation and Hygiene: Supervision/safety;Sit to/from stand       Functional mobility during ADLs: Supervision/safety       Vision Vision Assessment?: No apparent visual deficits   Perception     Praxis      Pertinent Vitals/Pain Pain Assessment: 0-10 Pain Score: 4  Pain Location: R groin area Pain Descriptors / Indicators: Sore Pain Intervention(s): Limited activity within patient's tolerance;Monitored during session;Repositioned;Premedicated before session     Hand Dominance Left   Extremity/Trunk Assessment Upper Extremity Assessment Upper Extremity Assessment: Overall WFL for tasks assessed   Lower Extremity Assessment Lower Extremity Assessment: RLE deficits/detail RLE Deficits / Details: decreased ROM and strength as expected post op   Cervical / Trunk Assessment Cervical / Trunk Assessment: Normal   Communication Communication Communication: No difficulties   Cognition Arousal/Alertness: Awake/alert Behavior During Therapy: Flat affect;Impulsive Overall Cognitive Status: Within Functional Limits for tasks assessed                     General Comments       Exercises       Shoulder Instructions      Home Living Family/patient expects to be discharged to:: Private residence Living Arrangements: Spouse/significant other Available Help  at Discharge: Family;Available 24 hours/day Type of Home: House Home Access: Stairs to enter Entergy CorporationEntrance Stairs-Number of Steps: 6 Entrance Stairs-Rails: Right Home Layout: One level     Bathroom Shower/Tub: Producer, television/film/videoWalk-in shower   Bathroom Toilet: Standard     Home Equipment:  Grab bars - tub/shower          Prior Functioning/Environment Level of Independence: Independent        Comments: Drives    OT Diagnosis: Acute pain   OT Problem List: Decreased strength;Decreased range of motion;Impaired balance (sitting and/or standing);Decreased activity tolerance;Decreased safety awareness;Decreased knowledge of use of DME or AE;Pain   OT Treatment/Interventions: Self-care/ADL training;Therapeutic exercise;DME and/or AE instruction;Therapeutic activities;Patient/family education;Balance training    OT Goals(Current goals can be found in the care plan section) Acute Rehab OT Goals Patient Stated Goal: to go home OT Goal Formulation: With patient Time For Goal Achievement: 07/05/16 Potential to Achieve Goals: Good ADL Goals Pt Will Transfer to Toilet: with modified independence;ambulating;regular height toilet Pt Will Perform Toileting - Clothing Manipulation and hygiene: with modified independence;sit to/from stand Pt Will Perform Tub/Shower Transfer: with modified independence;Shower transfer;ambulating  OT Frequency: Min 2X/week   Barriers to D/C:            Co-evaluation              End of Session Equipment Utilized During Treatment: Gait belt Nurse Communication: Mobility status  Activity Tolerance: Patient limited by fatigue Patient left: in bed;with bed alarm set;with call bell/phone within reach;with family/visitor present   Time: [id number] OT Time Calculation (min): 17 min Charges:  OT General Charges $OT Visit: 1 Procedure OT Evaluation $OT Eval Moderate Complexity: 1 Procedure G-Codes:    Nils PyleJulia Larrell Rapozo, OTR/L Pager: [phone] 06/21/2016, 4:49 PM

## 2016-06-21 NOTE — H&P (View-Only) (Signed)
 Patient name: Anthony Meadows MRN: [id number] DOB: 08/28/1951 Sex: male  REASON FOR VISIT: Recurrent [place] her right leg  HPI: Anthony Meadows is a 64 y.o. male here today for follow-up of right lower extremity ischemia. He has a very complex past history. He had multiple procedures in [place] with stenting of his right iliac system and also right superficial femoral system. These failed and he subsequently underwent right axillofemoral bypass and right femoral-popliteal bypass in 2009. He had presented to [facility] in June 2016. He had a critical ischemia of his right leg at that time with occlusion of his axillofemoral bypass. CT angiogram revealed patency of his right femoral-popliteal bypass despite occlusion of the inflow from his axillary to femoral bypass. He underwent thrombectomy and revision of his femoral anastomosis and is done well since that time. He reports that over the past 3 weeks he has had recurrent severe claudication in his right foot with Anthony Meadows rest pain. He is unable to walk any distance before he has severe pain in his right foot. He does have mild intolerable claudication in his left foot and calf. He does have extensive past cardiac history as well with an AICD implanted. Also chronic atrial fibrillation on Xarelto. Unfortunately he continues to smoke cigarettes.  Past Medical History:  Diagnosis Date  . AICD (automatic cardioverter/defibrillator) present   . Atrial fibrillation with rapid ventricular response (HCC)   . CHF (congestive heart failure) (HCC)   . COPD (chronic obstructive pulmonary disease) (HCC)   . Coronary artery disease   . Hypercholesterolemia   . Hypertension   . Myocardial infarction (HCC) 2002   "before heart OR"  . PVD (peripheral vascular disease) (HCC)     No family history on file.  SOCIAL HISTORY: Social History  Substance Use Topics  . Smoking status: Current Every Day Smoker    Packs/day: 0.25    Years: 47.00    Types:  Cigarettes  . Smokeless tobacco: Never Used  . Alcohol use No    Allergies  Allergen Reactions  . Penicillins Swelling    Current Outpatient Prescriptions  Medication Sig Dispense Refill  . amiodarone (PACERONE) 200 MG tablet Take 100 mg by mouth daily.    . atorvastatin (LIPITOR) 80 MG tablet Take 80 mg by mouth daily.    . carvedilol (COREG) 25 MG tablet Take 25 mg by mouth 2 (two) times daily with a meal.    . HYDROcodone-acetaminophen (NORCO/VICODIN) 5-325 MG tablet Take 1 tablet by mouth every 4 (four) hours as needed. 15 tablet 0  . metFORMIN (GLUCOPHAGE) 500 MG tablet Take 500 mg by mouth 2 (two) times daily with a meal.    . Rivaroxaban (XARELTO) 15 MG TABS tablet Take 15 mg by mouth daily with supper.    . sacubitril-valsartan (ENTRESTO) 49-51 MG Take 1 tablet by mouth 2 (two) times daily.    . torsemide (DEMADEX) 20 MG tablet Take 20 mg by mouth daily.     No current facility-administered medications for this visit.     REVIEW OF SYSTEMS:  [X] denotes positive finding, [ ] denotes negative finding Cardiac  Comments:  Chest pain or chest pressure:    Shortness of breath upon exertion: x   Short of breath when lying flat:    Irregular heart rhythm:        Vascular    Pain in calf, thigh, or hip brought on by ambulation: x   Pain in feet at night that wakes you up   from your sleep:  x   Blood clot in your veins:    Leg swelling:         Pulmonary    Oxygen at home:    Productive cough:     Wheezing:         Neurologic    Sudden weakness in arms or legs:     Sudden numbness in arms or legs:     Sudden onset of difficulty speaking or slurred speech:    Temporary loss of vision in one eye:     Problems with dizziness:         Gastrointestinal    Blood in stool:     Vomited blood:         Genitourinary    Burning when urinating:     Blood in urine:        Psychiatric    Major depression:         Hematologic    Bleeding problems:    Problems with blood  clotting too easily:        Skin    Rashes or ulcers:        Constitutional    Fever or chills:      PHYSICAL EXAM: Vitals:   06/13/16 0951  BP: 135/64  Pulse: (!) 54  Resp: 18  Temp: 97.5 F (36.4 C)  TempSrc: Oral  SpO2: 100%  Weight: 151 lb 14.4 oz (68.9 kg)  Height: 5\' 6"  (1.676 m)    GENERAL: The patient is a well-nourished male, in no acute distress. The vital signs are documented above. CARDIAC: Irregular with no murmur noted VASCULAR: Palpable radial pulses bilaterally. 1-2+ left femoral and absent left popliteal and distal pulses. Absent right femoral and distal pulses. No pulse noted in his axillofemoral and no audible Doppler flow in his axillofemoral bypass PULMONARY: There is good air exchange bilaterally without wheezing or rales. ABDOMEN: Soft and non-tender with normal pitched bowel sounds.  MUSCULOSKELETAL: There are no major deformities or cyanosis. NEUROLOGIC: No focal weakness or paresthesias are detected. SKIN: There are no ulcers or rashes noted. PSYCHIATRIC: The patient has a normal affect.  DATA:   He did undergo noninvasive studies in our office. This showed ankle arm index of 0.47 on the right and 0.67 on the left. Has monophasic signals in both feet in the tibial vessels.  Duplex revealed that his right femoropopliteal was opened despite occlusion of his axillofemoral bypass  MEDICAL ISSUES:  Had long discussion with the patient and his wife present. 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 Also has known renal insufficiency. Have recommended thrombectomy of his axillofemoral bypass. Feel comfortable proceeding with this on the basis of duplex without the risk of iodinated contrast. Will contact [place] cardiologist in [place] to determine if other preoperative evaluation needs to be done. His need  for surgery is urgent but not emergent since he does not have profound ischemia currently. Does have risk for occlusion of his femoropopliteal bypass which would certainly cause profound ischemia. We have tentatively scheduled him for thrombectomy and revision of his axillary to femoral bypass on September 1 which is one week from tomorrow. He will notify should he develop any worsening symptoms. Will obtain cardiac clearance prior to this.    Anthony BeganEarly, Anthony Meadows [facility] [place] [facility] [phone]

## 2016-06-21 NOTE — Transfer of Care (Signed)
Immediate Anesthesia Transfer of Care Note  Patient: Anthony [name]  Procedure(s) Performed: Procedure(s): RIGHT AXILLARY-FEMORAL THROMBECTOMY (Right)  Patient Location: PACU  Anesthesia Type:General  Level of Consciousness: awake, alert , oriented and sedated  Airway & Oxygen Therapy: Patient Spontanous Breathing and Patient connected to face mask oxygen  Post-op Assessment: Report given to RN, Post -op Vital signs reviewed and stable and Patient moving all extremities  Post vital signs: Reviewed and stable  Last Vitals:  Vitals:   06/21/16 0554  BP: 134/74  Pulse: (!) 55  Resp: 20  Temp: 36.7 C    Last Pain:  Vitals:   06/21/16 0554  PainSc: 3       Patients Stated Pain Goal: 2 (06/21/16 0554)  Complications: No apparent anesthesia complications

## 2016-06-21 NOTE — Op Note (Signed)
    OPERATIVE REPORT  DATE OF SURGERY: 06/21/2016  PATIENT: Anthony Meadows, 65 y.o. male MRN: [id number]  DOB: 07/27/1951  PRE-OPERATIVE DIAGNOSIS: Occluded right axillary to femoral bypass with her claudication and rest pain  POST-OPERATIVE DIAGNOSIS:  Same  PROCEDURE: Thrombectomy of right axillary to femoral bypass  SURGEON:  Gretta Beganodd Xayne Brumbaugh, M.D.  PHYSICIAN ASSISTANT: Karsten RoKim Trinh PA-C  ANESTHESIA:  Gen.  EBL: 400 ml  Total I/O In: 250 [IV Piggyback:250] Out: 525 [Urine:125; Blood:400]  BLOOD ADMINISTERED: None  DRAINS: None  SPECIMEN: None  COUNTS CORRECT:  YES  PLAN OF CARE: PACU   PATIENT DISPOSITION:  PACU - hemodynamically stable  PROCEDURE DETAILS: Patient is a complex 65 year old gentleman with multiple prior cardiac and vascular procedures. He had had the stenting of his right iliac artery and superficial femoral artery with failure and subsequent right axillary to femoral and femoral to popliteal bypass with vein all done in [place]. He had presented in June 2016 there are hospital with occlusion of his right axillary to femoral bypass. He underwent thrombectomy and revision of the distal anastomosis. He presented last week with recurrent ischemic symptoms and was found to have occlusion of his axillary to femoral bypass. This was a Gore-Tex graft. He had fortunately remained to have patency of his right femoral to below-knee popliteal vein graft. He is taken back to the operating this time for thrombectomy and possible revision. The right shoulder axillary area chest wall right groin and right leg were prepped and draped in the usual sterile fashion. Incision was made to the prior groin scar and carried down to isolate the Gore-Tex graft and also the vein graft. From the exploration one year ago he was noted to have no inflow from the external iliac artery on the right. The profundus was artery dissected and allowed to be controlled by occlusion clamp. The patient  was given 7000 units of intravenous heparin. After adequate circulation time the profunda and vein graft were occluded. The patient had a Dacron patch on the procedure in June 2016 over the femoral anastomosis. This was opened longitudinally with 11 blade. There was thrombus at the hood of the anastomosis and this was removed. The vein graft had excellent backbleeding. This was flushed with heparinized saline and reoccluded. A 4 Fogarty catheter was used to thrombectomize the axillary to femoral graft. The arterialized plug at the subclavian anastomosis was removed and there was excellent arterial inflow. This graft was reoccluded. The incision in the Dacron graft was closed with a running 6-0 Prolene suture. Clamps removed initially to the deep femoral and then to the vein graft. The patient was given 50 mg of protamine to reverse the heparin. He had a 2+ popliteal pulse and good Doppler signal at the posterior tibial at the ankle. The wounds were irrigated with saline. Hemostasis was obtained with electrocautery. The wounds were closed with 2-0 Vicryl in several layers and the deep layers. Skin was closed with a 3-0 subcuticular Vicryl stitch. Sterile dressing was applied the patient was transferred to the recovery room stable condition   Gretta Beganodd Katiejo Gilroy, M.D. 06/21/2016 9:42 AM

## 2016-06-21 NOTE — Anesthesia Postprocedure Evaluation (Signed)
Anesthesia Post Note  Patient: Anthony Meadows  Procedure(s) Performed: Procedure(s) (LRB): RIGHT AXILLARY-FEMORAL THROMBECTOMY (Right)  Patient location during evaluation: PACU Anesthesia Type: General Level of consciousness: awake and alert Pain management: pain level controlled Vital Signs Assessment: post-procedure vital signs reviewed and stable Respiratory status: spontaneous breathing, nonlabored ventilation, respiratory function stable and patient connected to nasal cannula oxygen Cardiovascular status: blood pressure returned to baseline and stable Postop Assessment: no signs of nausea or vomiting Anesthetic complications: no    Last Vitals:  Vitals:   06/21/16 1149 06/21/16 1213  BP:  120/72  Pulse:  62  Resp:  14  Temp: 36.4 C 36.4 C    Last Pain:  Vitals:   06/21/16 1213  TempSrc: Oral  PainSc:                  Delainey Winstanley A

## 2016-06-21 NOTE — Telephone Encounter (Signed)
-----   Message from Sharee PimpleMarilyn K McChesney, RN sent at 06/21/2016 10:02 AM EDT ----- Regarding: schedule postop 2 weeks   ----- Message ----- From: Raymond GurneyKimberly A Trinh, PA-C Sent: 06/21/2016   9:33 AM To: Vvs Charge Pool  S/p thrombectomy right ax-fem 06/21/16  F/u with Dr. Arbie CookeyEarly in 2 weeks.  Thanks Selena BattenKim

## 2016-06-21 NOTE — Telephone Encounter (Signed)
Sched appt 9/19 at 11:45. Lm on cell# to inform pt of appt.

## 2016-06-21 NOTE — Anesthesia Procedure Notes (Addendum)
Procedure Name: Intubation Date/Time: 06/21/2016 7:37 AM Performed by: Fransisca KaufmannMEYER, Tyteanna Ost E Pre-anesthesia Checklist: Patient identified, Emergency Drugs available, Suction available and Patient being monitored Patient Re-evaluated:Patient Re-evaluated prior to inductionOxygen Delivery Method: Circle System Utilized Preoxygenation: Pre-oxygenation with 100% oxygen Intubation Type: IV induction Ventilation: Mask ventilation without difficulty Laryngoscope Size: Miller and 3 Grade View: Grade I Tube type: Oral Tube size: 7.5 mm Number of attempts: 1 Airway Equipment and Method: Stylet Placement Confirmation: ETT inserted through vocal cords under direct vision,  positive ETCO2 and breath sounds checked- equal and bilateral Secured at: 23 cm Tube secured with: Tape Dental Injury: Teeth and Oropharynx as per pre-operative assessment

## 2016-06-21 NOTE — Progress Notes (Signed)
       Palpable graft pulse right chest wall Right groin soft without hematoma PT doppler signal  S/P Thrombectomy of right axillary to femoral bypass  Paulino Cork MAUREEN PA-C

## 2016-06-21 NOTE — Interval H&P Note (Signed)
History and Physical Interval Note:  06/21/2016 7:19 AM  Lorelee MarketFaron Shedd  has presented today for surgery, with the diagnosis of Occluded Right Axillary Femoral Bypass  T82.868D  The various methods of treatment have been discussed with the patient and family. After consideration of risks, benefits and other options for treatment, the patient has consented to  Procedure(s): RIGHT AXILLARY-FEMORAL THROMBECTOMY (Right) as a surgical intervention .  The patient's history has been reviewed, patient examined, no change in status, stable for surgery.  I have reviewed the patient's chart and labs.  Questions were answered to the patient's satisfaction.     Gretta BeganEarly, Gustav Knueppel

## 2016-06-21 NOTE — Interval H&P Note (Signed)
History and Physical Interval Note:  06/21/2016 7:18 AM  Anthony Meadows  has presented today for surgery, with the diagnosis of Occluded Right Axillary Femoral Bypass  T82.868D  The various methods of treatment have been discussed with the patient and family. After consideration of risks, benefits and other options for treatment, the patient has consented to  Procedure(s): RIGHT AXILLARY-FEMORAL THROMBECTOMY (Right) as a surgical intervention .  The patient's history has been reviewed, patient examined, no change in status, stable for surgery.  I have reviewed the patient's chart and labs.  Questions were answered to the patient's satisfaction.     Gretta BeganEarly, Birdia Jaycox

## 2016-06-21 NOTE — Care Management Note (Signed)
Case Management Note  Patient Details  Name: Anthony Meadows MRN: [id number] Date of Birth: 01-07-1951  Subjective/Objective:  Patient states he lives with spouse at home, pta indep.he is s/p thrombectomy of r axillary to fem bypass.  Await pt eval.  NCM will cont to follow for dc needs.                   Action/Plan:   Expected Discharge Date:                  Expected Discharge Plan:  Home/Self Care  In-House Referral:     Discharge planning Services  CM Consult  Post Acute Care Choice:    Choice offered to:     DME Arranged:    DME Agency:     HH Arranged:    HH Agency:     Status of Service:  In process, will continue to follow  If discussed at Long Length of Stay Meetings, dates discussed:    Additional Comments:  Leone Havenaylor, Katheren Jimmerson Clinton, RN 06/21/2016, 3:35 PM

## 2016-06-21 NOTE — Anesthesia Preprocedure Evaluation (Signed)
Anesthesia Evaluation  Patient identified by MRN, date of birth, ID band Patient awake    Reviewed: Allergy & Precautions, NPO status , Patient's Chart, lab work & pertinent test results, reviewed documented beta blocker date and time   Airway Mallampati: II  TM Distance: >3 FB Neck ROM: Full    Dental  (+) Teeth Intact, Dental Advisory Given   Pulmonary COPD, Current Smoker,    breath sounds clear to auscultation       Cardiovascular hypertension, + CAD, + Past MI, + Peripheral Vascular Disease, +CHF and + Orthopnea  + dysrhythmias Atrial Fibrillation + Cardiac Defibrillator  Rhythm:Regular Rate:Normal     Neuro/Psych    GI/Hepatic   Endo/Other  diabetes, Well Controlled, Type 2, Oral Hypoglycemic Agents  Renal/GU      Musculoskeletal   Abdominal   Peds  Hematology   Anesthesia Other Findings   Reproductive/Obstetrics                             Anesthesia Physical Anesthesia Plan  ASA: IV  Anesthesia Plan: General   Post-op Pain Management:    Induction: Intravenous  Airway Management Planned: Oral ETT  Additional Equipment:   Intra-op Plan:   Post-operative Plan: Extubation in OR  Informed Consent: I have reviewed the patients History and Physical, chart, labs and discussed the procedure including the risks, benefits and alternatives for the proposed anesthesia with the patient or authorized representative who has indicated his/her understanding and acceptance.   Dental advisory given  Plan Discussed with: CRNA, Anesthesiologist and Surgeon  Anesthesia Plan Comments:         Anesthesia Quick Evaluation

## 2016-06-22 ENCOUNTER — Encounter (HOSPITAL_COMMUNITY): Payer: Medicare Other

## 2016-06-22 LAB — CBC
HEMATOCRIT: 32.4 % — AB (ref 39.0–52.0)
HEMOGLOBIN: 10.5 g/dL — AB (ref 13.0–17.0)
MCH: 33.4 pg (ref 26.0–34.0)
MCHC: 32.4 g/dL (ref 30.0–36.0)
MCV: 103.2 fL — AB (ref 78.0–100.0)
PLATELETS: 93 10*3/uL — AB (ref 150–400)
RBC: 3.14 MIL/uL — AB (ref 4.22–5.81)
RDW: 13 % (ref 11.5–15.5)
WBC: 8.5 10*3/uL (ref 4.0–10.5)

## 2016-06-22 LAB — BASIC METABOLIC PANEL
ANION GAP: 5 (ref 5–15)
BUN: 18 mg/dL (ref 6–20)
CHLORIDE: 108 mmol/L (ref 101–111)
CO2: 27 mmol/L (ref 22–32)
Calcium: 8.7 mg/dL — ABNORMAL LOW (ref 8.9–10.3)
Creatinine, Ser: 1.72 mg/dL — ABNORMAL HIGH (ref 0.61–1.24)
GFR calc Af Amer: 47 mL/min — ABNORMAL LOW (ref >60)
GFR, EST NON AFRICAN AMERICAN: 41 mL/min — AB (ref >60)
GLUCOSE: 108 mg/dL — AB (ref 65–99)
POTASSIUM: 4.5 mmol/L (ref 3.5–5.1)
Sodium: 140 mmol/L (ref 135–145)

## 2016-06-22 MED ORDER — HYDROCODONE-ACETAMINOPHEN 5-325 MG PO TABS: 1.0000 | ORAL_TABLET | ORAL | 0 refills | Status: DC | PRN

## 2016-06-22 NOTE — Progress Notes (Signed)
Pt education completed to include future appointments, current prescriptions and medications, and doctors discharge instructions. Pt alert and oriented, vital signs stable. IV removed and pressure held. Pt discharged with nurse tech and wife via wheelchair.

## 2016-06-22 NOTE — Progress Notes (Addendum)
[facility] [place]  Subjective  - Tolerating PO liquids without nausea, ambulated this am start superficial bleeding without hematoma.  Dry dressing changed.   Objective (!) 118/59 71 98.2 F (36.8 C) (Oral) 17 96%  Intake/Output Summary (Last 24 hours) at 06/22/16 0744 Last data filed at 06/22/16 0600  Gross per 24 hour  Intake           2782.5 ml  Output             1100 ml  Net           1682.5 ml    Doppler bi[phaic DP/PT Right groin incision with skin edge clot formation fresh bleeding minimal after ambulating this am. Sinus brady Lungs nonlabored breathing  Assessment/Planning: POD # 1 Thrombectomy of right axillary to femoral bypass  Plan was to restart Xarelto today if no incisional hematoma.  Minimal incision skin bleeding after ambulation this am.  No active bleeding and no hematoma. I will discuss this with Dr. Randie Heinzain and we will decided if we need him to stay 24 more hours or to discharge later today.   Clinton GallantCOLLINS, EMMA [facility] 06/22/2016 7:44 AM --  Laboratory Lab Results:  Recent Labs  06/22/16 0403  WBC 8.5  HGB 10.5*  HCT 32.4*  PLT 93*   BMET  Recent Labs  06/22/16 0403  NA 140  K 4.5  CL 108  CO2 27  GLUCOSE 108*  BUN 18  CREATININE 1.72*  CALCIUM 8.7*    COAG Lab Results  Component Value Date   INR 1.09 06/18/2016   INR 2.04 06/09/2016   INR 1.16 04/10/2015   No results found for: PTT   I have independently interviewed patient and agree with PA assessment and plan above. Wound looks hemostatic and patient desires to leave. Will f/u with Dr. Arbie CookeyEarly.  Willeen Novak C. Randie Heinzain, MD [facility] [place] Office: [phone] Pager: [phone]

## 2016-06-24 ENCOUNTER — Encounter (HOSPITAL_COMMUNITY): Payer: Self-pay | Admitting: Vascular Surgery

## 2016-06-27 NOTE — Discharge Summary (Signed)
[facility] Discharge Summary   Patient ID:  Anthony Meadows MRN: [id number] DOB/AGE: August 10, 1951 65 y.o.  Admit date: 06/21/2016 Discharge date: 06/22/2016 Date of Surgery: 06/21/2016 Surgeon: Surgeon(s): Larina Earthly, MD  Admission Diagnosis: Occluded Right Axillary Femoral Bypass  T82.868D  Discharge Diagnoses:  Occluded Right Axillary Femoral Bypass  T82.868D  Secondary Diagnoses: Past Medical History:  Diagnosis Date  . AICD (automatic cardioverter/defibrillator) present    new device - 08/2015, [employer] DEVICE  . Anxiety   . Atrial fibrillation with rapid ventricular response (HCC)   . CHF (congestive heart failure) (HCC)   . Chronic kidney disease    pt. reports that he has been referred to renal spec., appt. is after his d/c from the [facility]. for vascular surgery  . COPD (chronic obstructive pulmonary disease) (HCC)   . Coronary artery disease   . Diabetes mellitus without complication (HCC)   . Dysrhythmia    a-fib, h/o cardioversion   . Hypercholesterolemia   . Hypertension   . Ischemic cardiomyopathy   . Myocardial infarction [facility]) 2002   "before heart OR"  . PVD (peripheral vascular disease) (HCC)     Procedure(s): RIGHT AXILLARY-FEMORAL THROMBECTOMY  Discharged Condition: good  HPI: Anthony Meadows is a 65 y.o. male here today for follow-up of right lower extremity ischemia. He has a very complex past history. He had multiple procedures in [place] with stenting of his right iliac system and also right superficial femoral system. These failed and he subsequently underwent right axillofemoral bypass and right femoral-popliteal bypass in 2009. He had presented to [facility] hospital in June 2016. He had a critical ischemia of his right leg at that time with occlusion of his axillofemoral bypass. CT angiogram revealed patency of his right femoral-popliteal bypass despite occlusion of the inflow from his axillary to femoral bypass. He underwent  thrombectomy and revision of his femoral anastomosis and is done well since that time. He reports that over the past 3 weeks he has had recurrent severe claudication in his right foot with early rest pain. He is unable to walk any distance before he has severe pain in his right foot. He does have mild intolerable claudication in his left foot and calf. He does have extensive past cardiac history as well with an AICD implanted. Also chronic atrial fibrillation on Xarelto. Unfortunately he continues to smoke cigarettes. DATA:   He did undergo noninvasive studies in our office. This showed ankle arm index of 0.47 on the right and 0.67 on the left. Has monophasic signals in both feet in the tibial vessels.  Duplex revealed that his right femoropopliteal was opened despite occlusion of his axillofemoral bypass.  Have recommended thrombectomy of his axillofemoral bypass.     Hospital Course:  Anthony Meadows is a 65 y.o. male is S/P Procedure(s): RIGHT AXILLARY-FEMORAL THROMBECTOMY  Doppler bi[phaic DP/PT Right groin incision with skin edge clot formation fresh bleeding minimal after ambulating this am. Sinus brady Lungs nonlabored breathing  Assessment/Planning: POD # 1 Thrombectomy of right axillary to femoral bypass  Plan was to restart Xarelto today if no incisional hematoma.  Minimal incision skin bleeding after ambulation this am.  No active bleeding and no hematoma. Discharge home in stable condition.  F/U with Dr. Arbie Cookey in 2 weeks.   Significant Diagnostic Studies: CBC Lab Results  Component Value Date   WBC 8.5 06/22/2016   HGB 10.5 (L) 06/22/2016   HCT 32.4 (L) 06/22/2016   MCV 103.2 (H) 06/22/2016   PLT  93 (L) 06/22/2016    BMET    Component Value Date/Time   NA 140 06/22/2016 0403   K 4.5 06/22/2016 0403   CL 108 06/22/2016 0403   CO2 27 06/22/2016 0403   GLUCOSE 108 (H) 06/22/2016 0403   BUN 18 06/22/2016 0403   CREATININE 1.72 (H) 06/22/2016 0403   CALCIUM 8.7  (L) 06/22/2016 0403   GFRNONAA 41 (L) 06/22/2016 0403   GFRAA 47 (L) 06/22/2016 0403   COAG Lab Results  Component Value Date   INR 1.09 06/18/2016   INR 2.04 06/09/2016   INR 1.16 04/10/2015     Disposition:  Discharge to :Home Discharge Instructions    Call MD for:  redness, tenderness, or signs of infection (pain, swelling, bleeding, redness, odor or green/yellow discharge around incision site)    Complete by:  As directed   Call MD for:  severe or increased pain, loss or decreased feeling  in affected limb(s)    Complete by:  As directed   Call MD for:  temperature >100.5    Complete by:  As directed   Discharge instructions    Complete by:  As directed   You may shower in 24 hours.  Leave the steri strips in place.  Dry dressing daily to right groin.   Discharge patient    Complete by:  As directed   Discharge pt to home   Driving Restrictions    Complete by:  As directed   No driving for 1 week   Increase activity slowly    Complete by:  As directed   Walk with assistance use walker or cane as needed   Lifting restrictions    Complete by:  As directed   No lifting for 4 weeks   Resume previous diet    Complete by:  As directed       Medication List    TAKE these medications   albuterol 108 (90 Base) MCG/ACT inhaler Commonly known as:  PROVENTIL HFA;VENTOLIN HFA Inhale into the lungs every 6 (six) hours as needed for wheezing or shortness of breath.   amiodarone 200 MG tablet Commonly known as:  PACERONE Take 100 mg by mouth daily.   atorvastatin 80 MG tablet Commonly known as:  LIPITOR Take 80 mg by mouth at bedtime.   bismuth subsalicylate 262 MG/15ML suspension Commonly known as:  PEPTO BISMOL Take 30 mLs by mouth every 6 (six) hours as needed for indigestion.   carvedilol 25 MG tablet Commonly known as:  COREG Take 25 mg by mouth 2 (two) times daily with a meal.   ENTRESTO 49-51 MG Generic drug:  sacubitril-valsartan Take 1 tablet by mouth 2 (two)  times daily.   HYDROcodone-acetaminophen 5-325 MG tablet Commonly known as:  NORCO/VICODIN Take 1 tablet by mouth every 4 (four) hours as needed. What changed:  Another medication with the same name was added. Make sure you understand how and when to take each.   HYDROcodone-acetaminophen 5-325 MG tablet Commonly known as:  NORCO/VICODIN Take 1-2 tablets by mouth every 4 (four) hours as needed for moderate pain. What changed:  You were already taking a medication with the same name, and this prescription was added. Make sure you understand how and when to take each.   LORazepam 0.5 MG tablet Commonly known as:  ATIVAN Take 0.5 mg by mouth 2 (two) times daily as needed (smoking cessation).   metFORMIN 500 MG tablet Commonly known as:  GLUCOPHAGE Take 500 mg by mouth 2 (  two) times daily after a meal.   Rivaroxaban 15 MG Tabs tablet Commonly known as:  XARELTO Take 15 mg by mouth at bedtime.   torsemide 20 MG tablet Commonly known as:  DEMADEX Take 20 mg by mouth daily.      Verbal and written Discharge instructions given to the patient. Wound care per Discharge AVS Follow-up Information    Early, Todd, MD In 2 weeks.   Specialties:  Vascular Surgery, Cardiology Why:  Our office will call you to arrange an appointment  Contact information: [address] [place] [address] [phone]           Signed: Clinton GallantCOLLINS, Leviathan Macera [facility] 06/27/2016, 9:14 AM

## 2016-07-04 ENCOUNTER — Encounter: Payer: Self-pay | Admitting: Vascular Surgery

## 2016-07-09 ENCOUNTER — Ambulatory Visit (INDEPENDENT_AMBULATORY_CARE_PROVIDER_SITE_OTHER): Payer: Medicare Other | Admitting: Vascular Surgery

## 2016-07-09 ENCOUNTER — Encounter: Payer: Self-pay | Admitting: Vascular Surgery

## 2016-07-09 VITALS — BP 110/65 | HR 56 | Temp 97.5°F | Resp 18 | Ht 66.0 in | Wt 154.1 lb

## 2016-07-09 DIAGNOSIS — I739 Peripheral vascular disease, unspecified: Secondary | ICD-10-CM

## 2016-07-09 NOTE — Progress Notes (Signed)
   Patient name: Anthony Meadows Munshi MRN: [id number] DOB: [birth year] Sex: male  REASON FOR VISIT: Follow-up recent thrombectomy of right axillofemoral bypass  HPI: Anthony Meadows Noffsinger is a 65 y.o. male here for follow-up. Underwent thrombectomy of his occluded right axillary femoral bypass on 06/21/2016. This is been placed a number of years ago and down to [place]. He had one episode of thrombosis of this in June 2016 with thrombectomy and revision of the distal anastomosis. He reports that he is "95% improved from preoperatively". He has had complete resolution of his calf claudication symptoms.  Current Outpatient Prescriptions  Medication Sig Dispense Refill  . albuterol (PROVENTIL HFA;VENTOLIN HFA) 108 (90 Base) MCG/ACT inhaler Inhale into the lungs every 6 (six) hours as needed for wheezing or shortness of breath.    Marland Kitchen. amiodarone (PACERONE) 200 MG tablet Take 100 mg by mouth daily.    Marland Kitchen. atorvastatin (LIPITOR) 80 MG tablet Take 80 mg by mouth at bedtime.     . carvedilol (COREG) 25 MG tablet Take 25 mg by mouth 2 (two) times daily with a meal.    . LORazepam (ATIVAN) 0.5 MG tablet Take 0.5 mg by mouth 2 (two) times daily as needed (smoking cessation).     . Rivaroxaban (XARELTO) 15 MG TABS tablet Take 15 mg by mouth at bedtime.     . sacubitril-valsartan (ENTRESTO) 49-51 MG Take 1 tablet by mouth 2 (two) times daily.    Marland Kitchen. torsemide (DEMADEX) 20 MG tablet Take 20 mg by mouth daily.     Marland Kitchen. bismuth subsalicylate (PEPTO BISMOL) 262 MG/15ML suspension Take 30 mLs by mouth every 6 (six) hours as needed for indigestion.    Marland Kitchen. HYDROcodone-acetaminophen (NORCO/VICODIN) 5-325 MG tablet Take 1 tablet by mouth every 4 (four) hours as needed. (Patient not taking: Reported on 07/09/2016) 15 tablet 0  . HYDROcodone-acetaminophen (NORCO/VICODIN) 5-325 MG tablet Take 1-2 tablets by mouth every 4 (four) hours as needed for moderate pain. (Patient not taking: Reported on 07/09/2016) 20 tablet 0   . metFORMIN (GLUCOPHAGE) 500 MG tablet Take 500 mg by mouth 2 (two) times daily after a meal.      No current facility-administered medications for this visit.      PHYSICAL EXAM: Vitals:   07/09/16 1143  BP: 110/65  Pulse: (!) 56  Resp: 18  Temp: 97.5 F (36.4 C)  TempSrc: Oral  SpO2: 98%  Weight: 154 lb 1.6 oz (69.9 kg)  Height: 5\' 6"  (1.676 m)    GENERAL: The patient is a well-nourished male, in no acute distress. The vital signs are documented above. Right groin incision is well-healed. He has a well-perfused foot with good Doppler flow in his posterior tibial and dorsalis pedis  MEDICAL ISSUES: Stable status post thrombectomy of axillofemoral bypass. I will continue his usual activities and see us in 3 months with the ankle arm indices and office follow-up   Larina Earthlyodd F. Freddrick Gladson, MD [facility] Office Tel [phone] Pager [phone]

## 2016-08-13 ENCOUNTER — Telehealth: Payer: Self-pay

## 2016-08-13 DIAGNOSIS — R200 Anesthesia of skin: Secondary | ICD-10-CM

## 2016-08-13 DIAGNOSIS — Z95828 Presence of other vascular implants and grafts: Secondary | ICD-10-CM

## 2016-08-13 DIAGNOSIS — M79604 Pain in right leg: Secondary | ICD-10-CM

## 2016-08-13 NOTE — Telephone Encounter (Signed)
Phone call from pt's wife.  Reported change if symptoms with right LE.  Reported c/o numbness/ pain from right hip to foot, with walking, and intermittently at rest.  Reported the right foot is cooler than it has been, since his surgery.  Will discuss with Dr. Arbie CookeyEarly.

## 2016-08-13 NOTE — Telephone Encounter (Signed)
Discussed pt's symptoms with Dr. Arbie CookeyEarly.  Recommended to schedule pt. to have BPG scan tomorrow in office.  Notified pt's. wife of recommendation.  Appt. given for 1:00 PM BPG scan, and 3:00 PM MD appt.  Agreed with plan.

## 2016-08-14 ENCOUNTER — Encounter (HOSPITAL_COMMUNITY): Payer: Self-pay | Admitting: General Practice

## 2016-08-14 ENCOUNTER — Ambulatory Visit (INDEPENDENT_AMBULATORY_CARE_PROVIDER_SITE_OTHER)
Admission: RE | Admit: 2016-08-14 | Discharge: 2016-08-14 | Disposition: A | Discharge status: Home / Self Care | Payer: Medicare Other | Source: Ambulatory Visit | Attending: Vascular Surgery | Admitting: Vascular Surgery

## 2016-08-14 ENCOUNTER — Inpatient Hospital Stay (HOSPITAL_COMMUNITY): Payer: Medicare Other

## 2016-08-14 ENCOUNTER — Inpatient Hospital Stay (HOSPITAL_COMMUNITY)
Admission: AD | Admit: 2016-08-14 | Discharge: 2016-08-18 | DRG: 271 | Disposition: A | Discharge status: Home / Self Care | Payer: Medicare Other | Source: Ambulatory Visit | Attending: Vascular Surgery | Admitting: Vascular Surgery

## 2016-08-14 ENCOUNTER — Encounter: Payer: Self-pay | Admitting: Vascular Surgery

## 2016-08-14 ENCOUNTER — Ambulatory Visit (INDEPENDENT_AMBULATORY_CARE_PROVIDER_SITE_OTHER): Payer: Medicare Other | Admitting: Vascular Surgery

## 2016-08-14 VITALS — BP 118/63 | HR 59 | Temp 97.9°F | Resp 24 | Ht 66.0 in | Wt 154.8 lb

## 2016-08-14 DIAGNOSIS — I130 Hypertensive heart and chronic kidney disease with heart failure and stage 1 through stage 4 chronic kidney disease, or unspecified chronic kidney disease: Secondary | ICD-10-CM | POA: Diagnosis present

## 2016-08-14 DIAGNOSIS — I4891 Unspecified atrial fibrillation: Secondary | ICD-10-CM | POA: Diagnosis present

## 2016-08-14 DIAGNOSIS — Z95810 Presence of automatic (implantable) cardiac defibrillator: Secondary | ICD-10-CM | POA: Diagnosis not present

## 2016-08-14 DIAGNOSIS — Z95828 Presence of other vascular implants and grafts: Secondary | ICD-10-CM | POA: Diagnosis not present

## 2016-08-14 DIAGNOSIS — Z01818 Encounter for other preprocedural examination: Secondary | ICD-10-CM | POA: Diagnosis not present

## 2016-08-14 DIAGNOSIS — I480 Paroxysmal atrial fibrillation: Secondary | ICD-10-CM | POA: Diagnosis present

## 2016-08-14 DIAGNOSIS — I998 Other disorder of circulatory system: Secondary | ICD-10-CM | POA: Diagnosis not present

## 2016-08-14 DIAGNOSIS — N183 Chronic kidney disease, stage 3 unspecified: Secondary | ICD-10-CM

## 2016-08-14 DIAGNOSIS — I2510 Atherosclerotic heart disease of native coronary artery without angina pectoris: Secondary | ICD-10-CM | POA: Diagnosis present

## 2016-08-14 DIAGNOSIS — I739 Peripheral vascular disease, unspecified: Secondary | ICD-10-CM | POA: Diagnosis present

## 2016-08-14 DIAGNOSIS — N179 Acute kidney failure, unspecified: Secondary | ICD-10-CM | POA: Diagnosis present

## 2016-08-14 DIAGNOSIS — R200 Anesthesia of skin: Secondary | ICD-10-CM | POA: Diagnosis not present

## 2016-08-14 DIAGNOSIS — I255 Ischemic cardiomyopathy: Secondary | ICD-10-CM | POA: Diagnosis present

## 2016-08-14 DIAGNOSIS — Z87891 Personal history of nicotine dependence: Secondary | ICD-10-CM

## 2016-08-14 DIAGNOSIS — M79671 Pain in right foot: Secondary | ICD-10-CM | POA: Diagnosis present

## 2016-08-14 DIAGNOSIS — Z7901 Long term (current) use of anticoagulants: Secondary | ICD-10-CM

## 2016-08-14 DIAGNOSIS — Z888 Allergy status to other drugs, medicaments and biological substances status: Secondary | ICD-10-CM | POA: Diagnosis not present

## 2016-08-14 DIAGNOSIS — Z951 Presence of aortocoronary bypass graft: Secondary | ICD-10-CM | POA: Diagnosis not present

## 2016-08-14 DIAGNOSIS — Z79899 Other long term (current) drug therapy: Secondary | ICD-10-CM | POA: Diagnosis not present

## 2016-08-14 DIAGNOSIS — Y848 Other medical procedures as the cause of abnormal reaction of the patient, or of later complication, without mention of misadventure at the time of the procedure: Secondary | ICD-10-CM | POA: Diagnosis present

## 2016-08-14 DIAGNOSIS — T82858A Stenosis of vascular prosthetic devices, implants and grafts, initial encounter: Principal | ICD-10-CM | POA: Diagnosis present

## 2016-08-14 DIAGNOSIS — I252 Old myocardial infarction: Secondary | ICD-10-CM | POA: Diagnosis not present

## 2016-08-14 DIAGNOSIS — Z880 Allergy status to penicillin: Secondary | ICD-10-CM

## 2016-08-14 DIAGNOSIS — T82868A Thrombosis of vascular prosthetic devices, implants and grafts, initial encounter: Secondary | ICD-10-CM

## 2016-08-14 DIAGNOSIS — I5022 Chronic systolic (congestive) heart failure: Secondary | ICD-10-CM | POA: Diagnosis present

## 2016-08-14 DIAGNOSIS — Z8249 Family history of ischemic heart disease and other diseases of the circulatory system: Secondary | ICD-10-CM | POA: Diagnosis not present

## 2016-08-14 DIAGNOSIS — Z955 Presence of coronary angioplasty implant and graft: Secondary | ICD-10-CM | POA: Diagnosis not present

## 2016-08-14 DIAGNOSIS — I70321 Atherosclerosis of unspecified type of bypass graft(s) of the extremities with rest pain, right leg: Secondary | ICD-10-CM

## 2016-08-14 DIAGNOSIS — J449 Chronic obstructive pulmonary disease, unspecified: Secondary | ICD-10-CM | POA: Diagnosis present

## 2016-08-14 DIAGNOSIS — R110 Nausea: Secondary | ICD-10-CM | POA: Diagnosis not present

## 2016-08-14 DIAGNOSIS — M79604 Pain in right leg: Secondary | ICD-10-CM | POA: Diagnosis not present

## 2016-08-14 HISTORY — DX: Adverse effect of unspecified anesthetic, initial encounter: T41.45XA

## 2016-08-14 HISTORY — DX: Other complications of anesthesia, initial encounter: T88.59XA

## 2016-08-14 HISTORY — DX: Prediabetes: R73.03

## 2016-08-14 HISTORY — DX: Dependence on supplemental oxygen: Z99.81

## 2016-08-14 LAB — URINALYSIS, ROUTINE W REFLEX MICROSCOPIC
BILIRUBIN URINE: NEGATIVE
Glucose, UA: NEGATIVE mg/dL
Ketones, ur: NEGATIVE mg/dL
Leukocytes, UA: NEGATIVE
Nitrite: NEGATIVE
PROTEIN: NEGATIVE mg/dL
Specific Gravity, Urine: 1.016 (ref 1.005–1.030)
pH: 5.5 (ref 5.0–8.0)

## 2016-08-14 LAB — COMPREHENSIVE METABOLIC PANEL
ALK PHOS: 25 U/L — AB (ref 38–126)
ALT: 13 U/L — ABNORMAL LOW (ref 17–63)
ANION GAP: 9 (ref 5–15)
AST: 14 U/L — ABNORMAL LOW (ref 15–41)
Albumin: 3.3 g/dL — ABNORMAL LOW (ref 3.5–5.0)
BUN: 28 mg/dL — ABNORMAL HIGH (ref 6–20)
CALCIUM: 9.1 mg/dL (ref 8.9–10.3)
CO2: 26 mmol/L (ref 22–32)
Chloride: 105 mmol/L (ref 101–111)
Creatinine, Ser: 2.1 mg/dL — ABNORMAL HIGH (ref 0.61–1.24)
GFR calc non Af Amer: 32 mL/min — ABNORMAL LOW (ref >60)
GFR, EST AFRICAN AMERICAN: 37 mL/min — AB (ref >60)
Glucose, Bld: 121 mg/dL — ABNORMAL HIGH (ref 65–99)
Potassium: 4.5 mmol/L (ref 3.5–5.1)
SODIUM: 140 mmol/L (ref 135–145)
TOTAL PROTEIN: 6.2 g/dL — AB (ref 6.5–8.1)
Total Bilirubin: 0.9 mg/dL (ref 0.3–1.2)

## 2016-08-14 LAB — URINE MICROSCOPIC-ADD ON
Squamous Epithelial / LPF: NONE SEEN
WBC, UA: NONE SEEN WBC/hpf (ref 0–5)

## 2016-08-14 LAB — PROTIME-INR
INR: 1.57
Prothrombin Time: 19 seconds — ABNORMAL HIGH (ref 11.4–15.2)

## 2016-08-14 LAB — HEPARIN LEVEL (UNFRACTIONATED): HEPARIN UNFRACTIONATED: 2.18 [IU]/mL — AB (ref 0.30–0.70)

## 2016-08-14 LAB — APTT: aPTT: 45 seconds — ABNORMAL HIGH (ref 24–36)

## 2016-08-14 MED ORDER — LORAZEPAM 0.5 MG PO TABS
0.5000 mg | ORAL_TABLET | Freq: Two times a day (BID) | ORAL | Status: DC | PRN
  Administered 2016-08-15: 0.5 mg via ORAL
  Filled 2016-08-14: qty 1

## 2016-08-14 MED ORDER — ALUM & MAG HYDROXIDE-SIMETH 200-200-20 MG/5ML PO SUSP: 15.0000 mL | ORAL | Status: DC | PRN

## 2016-08-14 MED ORDER — ONDANSETRON HCL 4 MG/2ML IJ SOLN: 4.0000 mg | Freq: Four times a day (QID) | INTRAMUSCULAR | Status: DC | PRN

## 2016-08-14 MED ORDER — LABETALOL HCL 5 MG/ML IV SOLN: 10.0000 mg | INTRAVENOUS | Status: DC | PRN

## 2016-08-14 MED ORDER — HYDROCODONE-ACETAMINOPHEN 5-325 MG PO TABS: 1.0000 | ORAL_TABLET | ORAL | Status: DC | PRN

## 2016-08-14 MED ORDER — POLYETHYLENE GLYCOL 3350 17 G PO PACK: 17.0000 g | PACK | Freq: Every day | ORAL | Status: DC | PRN

## 2016-08-14 MED ORDER — HYDROCODONE-ACETAMINOPHEN 5-325 MG PO TABS
1.0000 | ORAL_TABLET | ORAL | Status: DC | PRN
  Administered 2016-08-17 – 2016-08-18 (×3): 2 via ORAL
  Filled 2016-08-14: qty 1
  Filled 2016-08-14 (×3): qty 2

## 2016-08-14 MED ORDER — TORSEMIDE 20 MG PO TABS
20.0000 mg | ORAL_TABLET | Freq: Every day | ORAL | Status: DC
  Administered 2016-08-15 – 2016-08-18 (×3): 20 mg via ORAL
  Filled 2016-08-14 (×3): qty 1

## 2016-08-14 MED ORDER — IOPAMIDOL (ISOVUE-370) INJECTION 76%
INTRAVENOUS | Status: AC
  Administered 2016-08-14: 80 mL
  Filled 2016-08-14: qty 100

## 2016-08-14 MED ORDER — MORPHINE SULFATE (PF) 2 MG/ML IV SOLN: 2.0000 mg | INTRAVENOUS | Status: DC | PRN

## 2016-08-14 MED ORDER — METFORMIN HCL 500 MG PO TABS: 500.0000 mg | ORAL_TABLET | Freq: Two times a day (BID) | ORAL | Status: DC

## 2016-08-14 MED ORDER — BISMUTH SUBSALICYLATE 262 MG/15ML PO SUSP
30.0000 mL | Freq: Four times a day (QID) | ORAL | Status: DC | PRN
  Administered 2016-08-16: 30 mL via ORAL
  Filled 2016-08-14 (×3): qty 118

## 2016-08-14 MED ORDER — ATORVASTATIN CALCIUM 80 MG PO TABS
80.0000 mg | ORAL_TABLET | Freq: Every day | ORAL | Status: DC
  Administered 2016-08-14 – 2016-08-17 (×3): 80 mg via ORAL
  Filled 2016-08-14 (×4): qty 1

## 2016-08-14 MED ORDER — CARVEDILOL 25 MG PO TABS
25.0000 mg | ORAL_TABLET | Freq: Two times a day (BID) | ORAL | Status: DC
  Administered 2016-08-15 – 2016-08-18 (×6): 25 mg via ORAL
  Filled 2016-08-14 (×7): qty 1

## 2016-08-14 MED ORDER — METOPROLOL TARTRATE 5 MG/5ML IV SOLN: 2.0000 mg | INTRAVENOUS | Status: DC | PRN

## 2016-08-14 MED ORDER — GUAIFENESIN-DM 100-10 MG/5ML PO SYRP: 15.0000 mL | ORAL_SOLUTION | ORAL | Status: DC | PRN

## 2016-08-14 MED ORDER — PHENOL 1.4 % MT LIQD: 1.0000 | OROMUCOSAL | Status: DC | PRN

## 2016-08-14 MED ORDER — HYDRALAZINE HCL 20 MG/ML IJ SOLN: 5.0000 mg | INTRAMUSCULAR | Status: DC | PRN

## 2016-08-14 MED ORDER — HEPARIN (PORCINE) IN NACL 100-0.45 UNIT/ML-% IJ SOLN
750.0000 [IU]/h | INTRAMUSCULAR | Status: DC
  Administered 2016-08-14: 1000 [IU]/h via INTRAVENOUS
  Administered 2016-08-16: 750 [IU]/h via INTRAVENOUS
  Filled 2016-08-14 (×2): qty 250

## 2016-08-14 MED ORDER — ALBUTEROL SULFATE (2.5 MG/3ML) 0.083% IN NEBU: 3.0000 mL | INHALATION_SOLUTION | Freq: Four times a day (QID) | RESPIRATORY_TRACT | Status: DC | PRN

## 2016-08-14 MED ORDER — PANTOPRAZOLE SODIUM 40 MG PO TBEC
40.0000 mg | DELAYED_RELEASE_TABLET | Freq: Every day | ORAL | Status: DC
  Administered 2016-08-15: 40 mg via ORAL
  Filled 2016-08-14: qty 1

## 2016-08-14 MED ORDER — DOCUSATE SODIUM 100 MG PO CAPS
100.0000 mg | ORAL_CAPSULE | Freq: Two times a day (BID) | ORAL | Status: DC
  Administered 2016-08-14 – 2016-08-15 (×3): 100 mg via ORAL
  Filled 2016-08-14 (×3): qty 1

## 2016-08-14 MED ORDER — RIVAROXABAN 15 MG PO TABS: 15.0000 mg | ORAL_TABLET | Freq: Every day | ORAL | Status: DC

## 2016-08-14 MED ORDER — POTASSIUM CHLORIDE CRYS ER 20 MEQ PO TBCR: 20.0000 meq | EXTENDED_RELEASE_TABLET | Freq: Once | ORAL | Status: DC

## 2016-08-14 MED ORDER — AMIODARONE HCL 200 MG PO TABS
100.0000 mg | ORAL_TABLET | Freq: Every day | ORAL | Status: DC
  Administered 2016-08-15 – 2016-08-18 (×3): 100 mg via ORAL
  Filled 2016-08-14 (×3): qty 1

## 2016-08-14 NOTE — Progress Notes (Signed)
ANTICOAGULATION CONSULT NOTE - Initial Consult  Pharmacy Consult for Heparin Indication: atrial fibrillation/ femoral clot  Allergies  Allergen Reactions  . Penicillins Swelling    SWELLING REACTION UNSPECIFIED  Has patient had a PCN reaction causing immediate rash, facial/tongue/throat swelling, SOB or lightheadedness with hypotension: Yes Has patient had a PCN reaction causing severe rash involving mucus membranes or skin necrosis: No Has patient had a PCN reaction that required hospitalization No Has patient had a PCN reaction occurring within the last 10 years: No If all of the above answers are "NO", then may proceed with Cephalosporin use.     Patient Measurements:   Heparin Dosing Weight: 70.2 kg  Vital Signs: Temp: 98.4 F (36.9 C) (10/25 1544) Temp Source: Oral (10/25 1544) BP: 136/67 (10/25 1544) Pulse Rate: 60 (10/25 1544)  Labs: No results for input(s): HGB, HCT, PLT, APTT, LABPROT, INR, HEPARINUNFRC, HEPRLOWMOCWT, CREATININE, CKTOTAL, CKMB, TROPONINI in the last 72 hours.  CrCl cannot be calculated (Patient's most recent lab result is older than the maximum 21 days allowed.).   Medical History: Past Medical History:  Diagnosis Date  . AICD (automatic cardioverter/defibrillator) present    new device - 08/2015, [employer] DEVICE  . Anxiety   . Atrial fibrillation with rapid ventricular response (HCC)   . CHF (congestive heart failure) (HCC)   . Chronic kidney disease    pt. reports that he has been referred to renal spec., appt. is after his d/c from the [facility]. for vascular surgery  . COPD (chronic obstructive pulmonary disease) (HCC)   . Coronary artery disease   . Diabetes mellitus without complication (HCC)   . Dysrhythmia    a-fib, h/o cardioversion   . Hypercholesterolemia   . Hypertension   . Ischemic cardiomyopathy   . Myocardial infarction 2002   "before heart OR"  . PVD (peripheral vascular disease) (HCC)     Medications:  Prescriptions  Prior to Admission  Medication Sig Dispense Refill Last Dose  . albuterol (PROVENTIL HFA;VENTOLIN HFA) 108 (90 Base) MCG/ACT inhaler Inhale into the lungs every 6 (six) hours as needed for wheezing or shortness of breath.   Taking  . amiodarone (PACERONE) 200 MG tablet Take 100 mg by mouth daily.   Taking  . atorvastatin (LIPITOR) 80 MG tablet Take 80 mg by mouth at bedtime.    Taking  . bismuth subsalicylate (PEPTO BISMOL) 262 MG/15ML suspension Take 30 mLs by mouth every 6 (six) hours as needed for indigestion.   Not Taking  . carvedilol (COREG) 25 MG tablet Take 25 mg by mouth 2 (two) times daily with a meal.   Taking  . HYDROcodone-acetaminophen (NORCO/VICODIN) 5-325 MG tablet Take 1 tablet by mouth every 4 (four) hours as needed. (Patient not taking: Reported on 08/14/2016) 15 tablet 0 Not Taking  . HYDROcodone-acetaminophen (NORCO/VICODIN) 5-325 MG tablet Take 1-2 tablets by mouth every 4 (four) hours as needed for moderate pain. (Patient not taking: Reported on 08/14/2016) 20 tablet 0 Not Taking  . LORazepam (ATIVAN) 0.5 MG tablet Take 0.5 mg by mouth 2 (two) times daily as needed (smoking cessation).    Taking  . metFORMIN (GLUCOPHAGE) 500 MG tablet Take 500 mg by mouth 2 (two) times daily after a meal.    Not Taking  . Rivaroxaban (XARELTO) 15 MG TABS tablet Take 15 mg by mouth at bedtime.    Taking  . sacubitril-valsartan (ENTRESTO) 49-51 MG Take 1 tablet by mouth 2 (two) times daily.   Taking  . torsemide (DEMADEX) 20  MG tablet Take 20 mg by mouth daily.    Taking    Assessment: 65 year old male with atrial fibrillation on xarelto prior to admission (last dose 08/13/16). Patient has had multiple femoral thrombectomy procedures. Patient presents with similar pain to last time he had occlusion. Pharmacy has been asked to dose heparin. HgB 10.5, Platelets 93. Will avoid bolus and start infusion this evening.   Goal of Therapy:  APTT 66-102 Heparin level 0.3-0.7 units/ml Monitor  platelets by anticoagulation protocol: Yes   Plan:  Start heparin infusion at 1000 units/hr Check aPTT in 6 hours  Daily heparin levels while on heparin Continue to monitor H&H and platelets  Shaquavia Whisonant L Rody Keadle 08/14/2016,4:04 PM

## 2016-08-14 NOTE — Progress Notes (Signed)
Patient is a 65 year old male who previously had an axillary femoral bypass and right femoropopliteal elsewhere. He had thrombectomy of his right ax fem by Dr. Arbie Cookey June 2016. He subsequently had an additional thrombectomy of this September 2017. The patient noticed increasing pain and numbness and tingling in his right foot about 36-48 hours ago. He states similar symptoms occurred the last time his axillary femoral bypass occluded. He is able to walk on the leg. Motor function is intact as well as sensation. Patient is on Xarelto intake his last dose yesterday evening.  Past Medical History:  Diagnosis Date  . AICD (automatic cardioverter/defibrillator) present    new device - 08/2015, [employer] DEVICE  . Anxiety   . Atrial fibrillation with rapid ventricular response (HCC)   . CHF (congestive heart failure) (HCC)   . Chronic kidney disease    pt. reports that he has been referred to renal spec., appt. is after his d/c from the [facility]. for vascular surgery  . COPD (chronic obstructive pulmonary disease) (HCC)   . Coronary artery disease   . Diabetes mellitus without complication (HCC)   . Dysrhythmia    a-fib, h/o cardioversion   . Hypercholesterolemia   . Hypertension   . Ischemic cardiomyopathy   . Myocardial infarction 2002   "before heart OR"  . PVD (peripheral vascular disease) (HCC)    Past Surgical History:  Procedure Laterality Date  . ANGIOPLASTY / STENTING FEMORAL Right ~2012  . CARDIAC CATHETERIZATION  2002  . CARDIAC DEFIBRILLATOR PLACEMENT  01/2008   [employer] ; Virtuoso VR defibrillator; Model D154VWC; Serial [id number]  . CORONARY ANGIOPLASTY    . CORONARY ARTERY BYPASS GRAFT  2002   'CABG X5"  . PATCH ANGIOPLASTY Right 04/12/2015   Procedure: PATCH ANGIOPLASTY Right Femoral Artery;  Surgeon: Larina Earthly, MD;  Location: [facility] OR;  Service: Vascular;  Laterality: Right;  . THROMBECTOMY FEMORAL ARTERY Right 04/12/2015   Procedure: THROMBECTOMY Right Axilla-Femoral Gortex  Graft;  Surgeon: Larina Earthly, MD;  Location: [facility] OR;  Service: Vascular;  Laterality: Right;  . THROMBECTOMY FEMORAL ARTERY Right 06/21/2016   Procedure: RIGHT AXILLARY-FEMORAL THROMBECTOMY;  Surgeon: Larina Earthly, MD;  Location: [facility] OR;  Service: Vascular;  Laterality: Right;    Current Outpatient Prescriptions on File Prior to Visit  Medication Sig Dispense Refill  . albuterol (PROVENTIL HFA;VENTOLIN HFA) 108 (90 Base) MCG/ACT inhaler Inhale into the lungs every 6 (six) hours as needed for wheezing or shortness of breath.    Marland Kitchen amiodarone (PACERONE) 200 MG tablet Take 100 mg by mouth daily.    Marland Kitchen atorvastatin (LIPITOR) 80 MG tablet Take 80 mg by mouth at bedtime.     . carvedilol (COREG) 25 MG tablet Take 25 mg by mouth 2 (two) times daily with a meal.    . LORazepam (ATIVAN) 0.5 MG tablet Take 0.5 mg by mouth 2 (two) times daily as needed (smoking cessation).     . Rivaroxaban (XARELTO) 15 MG TABS tablet Take 15 mg by mouth at bedtime.     . sacubitril-valsartan (ENTRESTO) 49-51 MG Take 1 tablet by mouth 2 (two) times daily.    Marland Kitchen torsemide (DEMADEX) 20 MG tablet Take 20 mg by mouth daily.     Marland Kitchen bismuth subsalicylate (PEPTO BISMOL) 262 MG/15ML suspension Take 30 mLs by mouth every 6 (six) hours as needed for indigestion.    Marland Kitchen HYDROcodone-acetaminophen (NORCO/VICODIN) 5-325 MG tablet Take 1 tablet by mouth every 4 (four) hours as needed. (Patient not taking: Reported  on 08/14/2016) 15 tablet 0  . HYDROcodone-acetaminophen (NORCO/VICODIN) 5-325 MG tablet Take 1-2 tablets by mouth every 4 (four) hours as needed for moderate pain. (Patient not taking: Reported on 08/14/2016) 20 tablet 0  . metFORMIN (GLUCOPHAGE) 500 MG tablet Take 500 mg by mouth 2 (two) times daily after a meal.      No current facility-administered medications on file prior to visit.    Allergies  Allergen Reactions  . Penicillins Swelling    SWELLING REACTION UNSPECIFIED  Has patient had a PCN reaction causing immediate rash,  facial/tongue/throat swelling, SOB or lightheadedness with hypotension: Yes Has patient had a PCN reaction causing severe rash involving mucus membranes or skin necrosis: No Has patient had a PCN reaction that required hospitalization No Has patient had a PCN reaction occurring within the last 10 years: No If all of the above answers are "NO", then may proceed with Cephalosporin use.     Physical exam:  Vitals:   08/14/16 1339  BP: 118/63  Pulse: (!) 59  Resp: (!) 24  Temp: 97.9 F (36.6 C)  TempSrc: Oral  SpO2: 97%  Weight: 154 lb 12.8 oz (70.2 kg)  Height: 5\' 6"  (1.676 m)    Extremities: Posterior tibial Doppler signal right foot, 3 second capillary refill right foot, brisk by 2 triphasic Doppler flow left dorsalis pedis similar Refill  Chest: No audible Doppler flow right axillofemoral  Data: Ultrasound shows occlusion of the right axillofemoral bypass  Assessment: Recurrent occlusion right axillofemoral bypass. Potentially the right femoropopliteal bypass may be occluded as well however it did not occlude on a prior occasion and the fact that he has Doppler flow his right foot suggested it may still be open.  Plan: Patient will be admitted to the hospital today for IV heparin. We will repeat his CT angiogram abdomen and pelvis to determine whether or not  there may be different inflow options. Potential redo operation on Friday 10/27 by Dr. Arbie CookeyEarly.  We'll also have cardiology review the patient for cardiac risk stratification to determine whether or not he might be a candidate for an aortic procedure.  Fabienne Brunsharles Andee Chivers, MD [facility] Office: [phone] Pager: [phone]

## 2016-08-14 NOTE — Progress Notes (Addendum)
Pt has arrived to 2w as a direct admit from pt's doctor office. Pt has been placed on telemetry and CCMD has been notified. Pt has been oriented to room. Admissions nurse has been notified of pt's arrival. Pt informed this RN that he has poor veins and wanted IV team to insert his IV. IV team consult was placed. Pt has no complaints at this time. Pt has been updated on plan of care.  Berdine DanceLauren Moffitt BSN, RN

## 2016-08-15 DIAGNOSIS — N179 Acute kidney failure, unspecified: Secondary | ICD-10-CM

## 2016-08-15 DIAGNOSIS — Z01818 Encounter for other preprocedural examination: Secondary | ICD-10-CM

## 2016-08-15 DIAGNOSIS — I480 Paroxysmal atrial fibrillation: Secondary | ICD-10-CM

## 2016-08-15 DIAGNOSIS — I998 Other disorder of circulatory system: Secondary | ICD-10-CM

## 2016-08-15 DIAGNOSIS — N183 Chronic kidney disease, stage 3 unspecified: Secondary | ICD-10-CM

## 2016-08-15 DIAGNOSIS — I5022 Chronic systolic (congestive) heart failure: Secondary | ICD-10-CM

## 2016-08-15 DIAGNOSIS — I2510 Atherosclerotic heart disease of native coronary artery without angina pectoris: Secondary | ICD-10-CM

## 2016-08-15 LAB — CBC
HEMATOCRIT: 36.8 % — AB (ref 39.0–52.0)
HEMOGLOBIN: 12.3 g/dL — AB (ref 13.0–17.0)
MCH: 32.6 pg (ref 26.0–34.0)
MCHC: 33.4 g/dL (ref 30.0–36.0)
MCV: 97.6 fL (ref 78.0–100.0)
Platelets: 170 10*3/uL (ref 150–400)
RBC: 3.77 MIL/uL — AB (ref 4.22–5.81)
RDW: 12.4 % (ref 11.5–15.5)
WBC: 9 10*3/uL (ref 4.0–10.5)

## 2016-08-15 LAB — BASIC METABOLIC PANEL
ANION GAP: 6 (ref 5–15)
BUN: 24 mg/dL — AB (ref 6–20)
CALCIUM: 8.8 mg/dL — AB (ref 8.9–10.3)
CO2: 29 mmol/L (ref 22–32)
Chloride: 105 mmol/L (ref 101–111)
Creatinine, Ser: 1.97 mg/dL — ABNORMAL HIGH (ref 0.61–1.24)
GFR calc Af Amer: 40 mL/min — ABNORMAL LOW (ref >60)
GFR, EST NON AFRICAN AMERICAN: 34 mL/min — AB (ref >60)
GLUCOSE: 156 mg/dL — AB (ref 65–99)
Potassium: 4.8 mmol/L (ref 3.5–5.1)
Sodium: 140 mmol/L (ref 135–145)

## 2016-08-15 LAB — APTT
APTT: 105 s — AB (ref 24–36)
APTT: 93 s — AB (ref 24–36)
aPTT: 117 seconds — ABNORMAL HIGH (ref 24–36)

## 2016-08-15 LAB — HEPARIN LEVEL (UNFRACTIONATED): Heparin Unfractionated: 1.2 IU/mL — ABNORMAL HIGH (ref 0.30–0.70)

## 2016-08-15 MED ORDER — VANCOMYCIN HCL IN DEXTROSE 1-5 GM/200ML-% IV SOLN
1000.0000 mg | INTRAVENOUS | Status: AC
  Administered 2016-08-16: 1000 mg via INTRAVENOUS
  Filled 2016-08-15 (×2): qty 200

## 2016-08-15 NOTE — Progress Notes (Signed)
Subjective: Interval History: none.. Comfortable this morning.  Objective: Vital signs in last 24 hours: Temp:  [97.9 F (36.6 C)-98.4 F (36.9 C)] 97.9 F (36.6 C) (10/26 0424) Pulse Rate:  [51-62] 59 (10/26 0424) Resp:  [18-24] 18 (10/26 0424) BP: (100-136)/(45-67) 105/48 (10/26 0424) SpO2:  [97 %-99 %] 97 % (10/26 0424) Weight:  [152 lb (68.9 kg)-154 lb 12.8 oz (70.2 kg)] 152 lb (68.9 kg) (10/25 1728)  Intake/Output from previous day: No intake/output data recorded. Intake/Output this shift: No intake/output data recorded.  No palpable right femoral pulse. Does have 2+ left femoral pulse. I did not palpate left pedal pulses  Lab Results:  Recent Labs  08/15/16 0239  WBC 9.0  HGB 12.3*  HCT 36.8*  PLT 170   BMET  Recent Labs  08/14/16 1627  NA 140  K 4.5  CL 105  CO2 26  GLUCOSE 121*  BUN 28*  CREATININE 2.10*  CALCIUM 9.1    Studies/Results: No results found. Anti-infectives: Anti-infectives    None      Assessment/Plan: s/p * No surgery found * I reviewed his CT scan and discussed with the patient. This does show occlusion of his axillofemoral bypass. Fortunately continues to have flow in his right femoral to popliteal bypass. He does have irregularity in his infrarenal aorta and left iliac system. Have recommended a left to right femorofemoral bypass and thrombectomy of his axillofemoral bypass. Has been felt to be a prohibitive risk for open and aortobifemoral bypass in the past. If this continues to fail may have to add reassess his cardiac risk. He understands and will be prepared for surgery tomorrow   LOS: 1 day   [place], Anthony 08/15/2016, 7:13 AM

## 2016-08-15 NOTE — Consult Note (Addendum)
The patient has been seen in conjunction with Vin Bhagat, PAC. All aspects of care have been considered and discussed. The patient has been personally interviewed, examined, and all clinical data has been reviewed.   Please see the assessment and plan which has been amended and updated. This note reflects our recommendations concerning the patient's preoperative status.   CARDIOLOGY CONSULT NOTE   Patient ID: Anthony Meadows Trefry MRN: [id number] DOB/AGE: 05-08-1951 65 y.o.  Admit date: 08/14/2016  Primary Physician   Truddie CocoMCCLURE,KAREN, FNP Primary Cardiologist   [facility] (Dr. Elouise MunroeZakary) Reason for Consultation  Pre-op clearance for PV procedure Requesting Physician  Dr. Arbie CookeyEarly  HPI: Anthony Meadows Marceaux is a 65 y.o. male with a history of CAD s/p CABG x 4 2002 at [facility] (no heart cath since surgery; high risk myocardial perfusion study 03/2015), chronic systolic CHF s/p ICD in 2012 @ [facility] and revision to single chamber implantable cardioverter defibrillator (Medtroic) generator [facility] on 08/28/2015 , Paroxysmal atrial fibrillation on Xaralto and amiodarone for rhythm control, PVD s/p multiple procedures, ongoing tobacco smoking who consulted for cardiac clearance.   [facility] in [facility]/2015 for perioperative CV evaluation and risk assessment s/p  thrombectomy of right axillofemoral bypass with exploration of right femoral artery and Dacron patch angioplasty of right femoral anastomosis. F/u Myoview showed  Large fixed heterogeneous perfusion defect of the anterior,lateral and inferior walls including the apex. No definite reversible ischemia by pharmacologic stress and EF 20% (high risk). 2D echo with severely reduced LVF EF 20-25% with moderate MR. No pulmonary HTN. He was on coumadin at time and switched to [facility].   [facility] for cardiogenic shock in setting of Afib RVR. Now back in normal sinus rhythm and on amiodarone 100  mg daily.  Subsequently the patient again had thrombectomy of right axillary to femoral bypass 06/21/16.   Yesterday patient was seen by Dr. Darrick PennaFields in clinic for R sided foot pain for 36-48 hours. Last dose of Xaralto 08/13/16. He was admitted directly for IV heparin and further evaluation. CT showed of occlusion of his axillofemoral bypass. Plan for Left to right femorofemoral bypass and thrombectomy of his axillofemoral bypass. Cardiology asked for cardiac clearance.   Patient had very limited ambulation due to PVD. Admits to having intermittent orthopnea and dyspnea. Denies any chest pain, palpitations, LE edema, melena. Compliant with medication and diet. States "quit tobacco smoking" 2 weeks ago.   Past Medical History:  Diagnosis Date  . AICD (automatic cardioverter/defibrillator) present    new device - 08/2015, MEDTRONIC DEVICE  . Anxiety   . Atrial fibrillation with rapid ventricular response (HCC)   . CHF (congestive heart failure) (HCC)   . Complication of anesthesia    "takes him awhile to come out of it" (08/14/2016)  . COPD (chronic obstructive pulmonary disease) (HCC)   . Coronary artery disease   . Dysrhythmia    a-fib, h/o cardioversion   . Hypercholesterolemia   . Hypertension   . Ischemic cardiomyopathy   . Myocardial infarction 2002   "before heart OR"  . On home oxygen therapy    "2L; only when I need it" (08/14/2016)  . Pre-diabetes    "put him on RX then took him off earlier this year" (08/14/2016)  . PVD (peripheral vascular disease) (HCC)      Past Surgical History:  Procedure Laterality Date  . ANGIOPLASTY / STENTING FEMORAL Right ~2012  . CARDIAC CATHETERIZATION  2002  .  CARDIAC DEFIBRILLATOR PLACEMENT  01/2008   Medtronic ; Virtuoso VR defibrillator; Model [id number]; Serial [id number]  . CORONARY ANGIOPLASTY    . CORONARY ARTERY BYPASS GRAFT  2002   'CABG X5"  . PATCH ANGIOPLASTY Right 04/12/2015   Procedure: PATCH ANGIOPLASTY Right Femoral Artery;   Surgeon: Larina Earthly, MD;  Location: [facility] OR;  Service: Vascular;  Laterality: Right;  . THROMBECTOMY FEMORAL ARTERY Right 04/12/2015   Procedure: THROMBECTOMY Right Axilla-Femoral Gortex Graft;  Surgeon: Larina Earthly, MD;  Location[facility] OR;  Service: Vascular;  Laterality: Right;  . THROMBECTOMY FEMORAL ARTERY Right 06/21/2016   Procedure: RIGHT AXILLARY-FEMORAL THROMBECTOMY;  Surgeon: Larina Earthly, MD;  Location: [facility] OR;  Service: Vascular;  Laterality: Right;    Allergies  Allergen Reactions  . Metformin And Related Diarrhea and Nausea And Vomiting  . Penicillins Swelling    SWELLING REACTION UNSPECIFIED  Has patient had a PCN reaction causing immediate rash, facial/tongue/throat swelling, SOB or lightheadedness with hypotension: Yes Has patient had a PCN reaction causing severe rash involving mucus membranes or skin necrosis: No Has patient had a PCN reaction that required hospitalization No Has patient had a PCN reaction occurring within the last 10 years: No If all of the above answers are "NO", then may proceed with Cephalosporin use.     I have reviewed the patient's current medications . amiodarone  100 mg Oral Daily  . atorvastatin  80 mg Oral QHS  . carvedilol  25 mg Oral BID WC  . docusate sodium  100 mg Oral BID  . metFORMIN  500 mg Oral BID PC  . pantoprazole  40 mg Oral Daily  . potassium chloride  20-40 mEq Oral Once  . torsemide  20 mg Oral Daily  . [START ON 08/16/2016] vancomycin  1,000 mg Intravenous On Call to OR   . heparin 850 Units/hr (08/15/16 0407)   albuterol, alum & mag hydroxide-simeth, bismuth subsalicylate, guaiFENesin-dextromethorphan, hydrALAZINE, HYDROcodone-acetaminophen, labetalol, LORazepam, metoprolol, morphine injection, ondansetron, phenol, polyethylene glycol  Prior to Admission medications   Medication Sig Start Date End Date Taking? Authorizing Provider  albuterol (PROVENTIL HFA;VENTOLIN HFA) 108 (90 Base) MCG/ACT inhaler Inhale 2 puffs into  the lungs every 6 (six) hours as needed for wheezing or shortness of breath.    Yes Historical Provider, MD  amiodarone (PACERONE) 200 MG tablet Take 100 mg by mouth daily.   Yes Historical Provider, MD  atorvastatin (LIPITOR) 80 MG tablet Take 80 mg by mouth at bedtime.    Yes Historical Provider, MD  carvedilol (COREG) 25 MG tablet Take 25 mg by mouth 2 (two) times daily with a meal.   Yes Historical Provider, MD  Cholecalciferol (VITAMIN D3) 10000 units TABS Take 1 tablet by mouth daily after breakfast.   Yes Historical Provider, MD  HYDROcodone-acetaminophen (NORCO/VICODIN) 5-325 MG tablet Take 1-2 tablets by mouth every 4 (four) hours as needed for moderate pain. 06/22/16  Yes Lars Mage, PA-C  LORazepam (ATIVAN) 0.5 MG tablet Take 0.5 mg by mouth at bedtime.  05/08/16  Yes Historical Provider, MD  Rivaroxaban (XARELTO) 15 MG TABS tablet Take 15 mg by mouth at bedtime.    Yes Historical Provider, MD  sacubitril-valsartan (ENTRESTO) 49-51 MG Take 1 tablet by mouth 2 (two) times daily.   Yes Historical Provider, MD  torsemide (DEMADEX) 20 MG tablet Take 20 mg by mouth daily.    Yes Historical Provider, MD  HYDROcodone-acetaminophen (NORCO/VICODIN) 5-325 MG tablet Take 1 tablet by mouth every 4 (four)  hours as needed. Patient not taking: Reported on 08/14/2016 06/09/16   Ivery Quale, PA-C     Social History   Social History  . Marital status: Married    Spouse name: N/A  . Number of children: N/A  . Years of education: N/A   Occupational History  . Not on file.   Social History Main Topics  . Smoking status: Former Smoker    Packs/day: 3.00    Years: 52.00    Types: Cigarettes    Quit date: 08/04/2016  . Smokeless tobacco: Never Used  . Alcohol use No  . Drug use: No  . Sexual activity: Not Currently   Other Topics Concern  . Not on file   Social History Narrative  . No narrative on file    Family hx . Heart disease Mother  . Stroke Father  . Heart failure Brother  .  Heart failure Brother   ROS:  Full 14 point review of systems complete and found to be negative unless listed above.  Physical Exam: Blood pressure (!) 115/50, pulse (!) 53, temperature 97.9 F (36.6 C), temperature source Oral, resp. rate 18, height 5\' 6"  (1.676 m), weight 152 lb (68.9 kg), SpO2 97 %.  General: Well developed, well nourished, male in no acute distress Head: Eyes PERRLA, No xanthomas. Normocephalic and atraumatic, oropharynx without edema or exudate.  Lungs: Resp regular and unlabored, Faint bibasilar rales.  Heart: RRR no s3, s4, or murmurs..   Neck: No carotid bruits. No lymphadenopathy. No JVD. Abdomen: Bowel sounds present, abdomen soft and non-tender without masses or hernias noted. Msk:  No spine or cva tenderness. No weakness, no joint deformities or effusions. Extremities: No clubbing, cyanosis or edema. DP/PT/Radials 2+ and equal bilaterally. Neuro: Alert and oriented X 3. No focal deficits noted. Psych:  Good affect, responds appropriately Skin: No rashes or lesions noted.  Labs:   Lab Results  Component Value Date   WBC 9.0 08/15/2016   HGB 12.3 (L) 08/15/2016   HCT 36.8 (L) 08/15/2016   MCV 97.6 08/15/2016   PLT 170 08/15/2016    Recent Labs  08/14/16 1627  INR 1.57    Recent Labs Lab 08/14/16 1627  NA 140  K 4.5  CL 105  CO2 26  BUN 28*  CREATININE 2.10*  CALCIUM 9.1  PROT 6.2*  BILITOT 0.9  ALKPHOS 25*  ALT 13*  AST 14*  GLUCOSE 121*  ALBUMIN 3.3*    Lipase  Date/Time Value Ref Range Status  02/02/2016 02:27 PM 48 11 - 51 U/L Final   Myoview 04/11/15 IMPRESSION: 1. Large fixed heterogeneous perfusion defect of the anterior, lateral and inferior walls including the apex. No definite reversible ischemia by pharmacologic stress.  2. Global hypokinesis with left ventricular chamber dilatation.  3. Left ventricular ejection fraction 20%  4. High-risk stress test findings*.  Echo: 04/11/15 LV EF: 20% -    25%  ------------------------------------------------------------------- Indications:      CHF - 428.0.  ------------------------------------------------------------------- History:   PMH:   Congestive heart failure.  Chronic obstructive pulmonary disease.  Risk factors:  Hypertension.  ------------------------------------------------------------------- Study Conclusions  - Left ventricle: The cavity size was mildly dilated. Wall   thickness was normal. Systolic function was severely reduced. The   estimated ejection fraction was in the range of 20% to 25%.   Diffuse hypokinesis. - Mitral valve: There was moderate regurgitation. - Left atrium: The atrium was moderately dilated. - Pulmonary arteries: Systolic pressure was mildly increased. PA  peak pressure: 32 mm Hg (S).  ECG:  04/11/15 afib at rate of 88 bpm, incomplete LBBB. Pending EKG this admission  Radiology:  Ct Angio Ao+bifem W &/or Wo Contrast  Result Date: 08/15/2016 CLINICAL DATA:  Peripheral vascular disease EXAM: CT ANGIOGRAPHY OF ABDOMINAL AORTA WITH ILIOFEMORAL RUNOFF TECHNIQUE: Multidetector CT imaging of the abdomen, pelvis and lower extremities was performed using the standard protocol during bolus administration of intravenous contrast. Multiplanar CT image reconstructions and MIPs were obtained to evaluate the vascular anatomy. CONTRAST:  80 cc Isovue 370 COMPARISON:  04/10/2015 FINDINGS: VASCULAR Aorta: The right axillofemoral bypass graft is occluded. There is extensive irregular atherosclerotic plaque involving the abdominal aorta throughout its length. It is non aneurysmal. Celiac: Patent. SMA: Patent. Renals: Single bilateral renal arteries are patent. IMA: There is disease at the origin. The vessel is there after patent. The superior hemorrhoidal branch is hypertrophied providing some flow to the right lower extremity. Lower Extremity Inflow: Right common and external iliac arteries are occluded. Right internal  iliac artery trunk is occluded. Branches reconstitute. There is diffuse atherosclerotic disease of the left common and external iliac arteries without significant focal narrowing. Left internal iliac artery is patent. Outflow: Right common femoral artery is patent. The superficial femoral artery is occluded. A femoral to popliteal bypass graft is patent. There is layering of contrast in the graft with non-opacified blood laying above the contrast in a supine patient. This is similar to mixing artifact. Profunda femoral artery branches are patent. There is severe disease involving the tibial vessels without a single continuous vessel to the ankle. This is not significantly changed. Left common femoral artery is patent. Profunda femoral artery branches are patent. The superficial femoral artery is severely diseased then becomes occluded in the mid thigh. The popliteal artery reconstitutes just beyond the adductor canal. The popliteal artery is occluded below the knee. Peroneal and anterior tibial arteries reconstitute via collateral geniculate vessels. Runoff: See above. Lower Extremity Inflow: See above. Outflow: See above. Runoff: See above. Veins: The study was performed in arterial phase. Venous structures are non-opacified. Review of the MIP images confirms the above findings. NON-VASCULAR Lower chest: Small right pleural effusion is unchanged. Hepatobiliary: Visualized liver is unremarkable.  Small gallstones. Pancreas: Unremarkable Spleen: Unremarkable Adrenals/Urinary Tract: Left adrenal nodule is stable. Right adrenal gland is unremarkable. Several simple cysts in the kidneys are stable. A lateral cyst emanating from the left kidney is minimally complicated with peripheral posterior calcification. Stomach/Bowel: Diverticulosis in the ascending colon is unchanged. Normal appendix. No evidence of small-bowel obstruction. Lymphatic: No obvious retroperitoneal adenopathy. Reproductive: Prostate and bladder are  unremarkable. Other: Small amount of free fluid layers in the upper right hemipelvis. Musculoskeletal: Exostosis emanating from the region of the proximal left tibia and fibula is unchanged. No vertebral compression deformity. IMPRESSION: VASCULAR Right axilla femoral bypass graft is occluded. Atherosclerotic changes within the aorta are not significantly changed. Right common and external iliac artery occlusion. Left common and external iliac artery are patent. Right femoral to popliteal artery below the knee bypass graft is patent. Severe runoff disease in the right calf is stable. Left superficial femoral artery occlusion. Left popliteal artery reconstitutes above the knee but is occluded below the knee. Two vessel runoff to the left ankle below the occlusion. This is stable. NON-VASCULAR Cholelithiasis. Electronically Signed   By: Jolaine Click M.D.   On: 08/15/2016 08:04    ASSESSMENT AND PLAN:     1. CAD s/P CABG x 4 03/17/2001  (LIMA-LAD,  SVG-OM1, SVG-PDA, SVG-D1) - No anginal pain. No ischemia noted on high risk myocardial perfusion study June 2016. No catheterization sense initial bypass surgery greater than15 years ago. Continue statin. Not on ASA due to need of anticoagulation.   2. Chronic systolic CHF/ICM s/p ICD revision in 08/2015 (medtronic) - intermittent dyspnea and orthopnea. Continue home dose of Torsemide 20mg  and Coreg 25mg  BID. Held [place] due to Scr of 2.1, unknown baseline. Seems like he has CKD, stage III. His scr was 1.72 on 06/22/16.   3. Paroxysmal atrial fibrillation with rhythm control on low dose amiodarone. - Has ICD. On amiodarone. CHADSVASCs score of 4. Xarelto on hold for surgery. Currently on IV heparin. Primary planning to change to Coumadin (he was on this previously)  4. PVD - Followed by Dr. Arbie Cookey. With planned femorofemoral bypass and axillofemoral thrombectomy.     5. Preoperative cardiovascular clearance - The patient is cleared for the upcoming vascular  surgical procedure. He will be at high risk (>5%) for cardiovascular complications including acute ischemic events, arrhythmias (his recurrent A. fib) and heart failure. He is not volume overloaded nor having unstable angina. Cardiovascular substrate is overall quite severely impaired.      6. Acute on chronic kidney disease - Rising creatinine is concerning. Suspect some component of contrast-induced acute kidney injury. We will hold Entresto and any other nephrotoxins., trend kidney function, and follow with you.  Active Problems:   Thrombosis of arterial graft (HCC) SignedManson Passey, PA 08/15/2016, 12:58 PM Pager [phone]  Co-Sign MD

## 2016-08-15 NOTE — Progress Notes (Signed)
ANTICOAGULATION CONSULT NOTE - Follow Up Consult  Pharmacy Consult for Heparin  Indication: atrial fibrillation and occluded right axillofemoral bypass  Allergies  Allergen Reactions  . Metformin And Related Diarrhea and Nausea And Vomiting  . Penicillins Swelling    SWELLING REACTION UNSPECIFIED  Has patient had a PCN reaction causing immediate rash, facial/tongue/throat swelling, SOB or lightheadedness with hypotension: Yes Has patient had a PCN reaction causing severe rash involving mucus membranes or skin necrosis: No Has patient had a PCN reaction that required hospitalization No Has patient had a PCN reaction occurring within the last 10 years: No If all of the above answers are "NO", then may proceed with Cephalosporin use.     Patient Measurements: Height: 5\' 6"  (167.6 cm) Weight: 152 lb (68.9 kg) IBW/kg (Calculated) : 63.8  Vital Signs: Temp: 98.1 F (36.7 C) (10/25 2009) Temp Source: Oral (10/25 2009) BP: 100/45 (10/26 0116) Pulse Rate: 62 (10/26 0116)  Labs:  Recent Labs  08/14/16 1627 08/15/16 0239  HGB  --  12.3*  HCT  --  36.8*  PLT  --  170  APTT 45* 117*  LABPROT 19.0*  --   INR 1.57  --   HEPARINUNFRC 2.18* 1.20*  CREATININE 2.10*  --     Estimated Creatinine Clearance: 32.5 mL/min (by C-G formula based on SCr of 2.1 mg/dL (H)).   Assessment: 65 y/o M on Xarelto PTA for afib, heparin while Xarelto on hold in the setting of occluded axillofemoral bypass, aPTT this AM is elevated, currently using aPTT to dose heparin for now due to Xarelto influence on anti-Xa levels, no issues per RN.   Goal of Therapy:  Heparin level 0.3-0.7 units/ml aPTT 66-102 seconds Monitor platelets by anticoagulation protocol: Yes   Plan:  -Decrease heparin to 850 units/hr -1200 aPTT -Daily CBC/HL/aPTT  Abran DukeLedford, Lashawn Orrego 08/15/2016,3:56 AM

## 2016-08-15 NOTE — Progress Notes (Addendum)
ANTICOAGULATION CONSULT NOTE - Follow Up Consult  Pharmacy Consult for Heparin  Indication: atrial fibrillation and occluded right axillofemoral bypass  Allergies  Allergen Reactions  . Metformin And Related Diarrhea and Nausea And Vomiting  . Penicillins Swelling    SWELLING REACTION UNSPECIFIED  Has patient had a PCN reaction causing immediate rash, facial/tongue/throat swelling, SOB or lightheadedness with hypotension: Yes Has patient had a PCN reaction causing severe rash involving mucus membranes or skin necrosis: No Has patient had a PCN reaction that required hospitalization No Has patient had a PCN reaction occurring within the last 10 years: No If all of the above answers are "NO", then may proceed with Cephalosporin use.     Patient Measurements: Height: 5\' 6"  (167.6 cm) Weight: 152 lb (68.9 kg) IBW/kg (Calculated) : 63.8  Heparin dosing weight 68.9 kg  Vital Signs: Temp: 97.9 F (36.6 C) (10/26 0424) Temp Source: Oral (10/26 0424) BP: 115/50 (10/26 0808) Pulse Rate: 53 (10/26 0808)  Labs:  Recent Labs  08/14/16 1627 08/15/16 0239 08/15/16 1223  HGB  --  12.3*  --   HCT  --  36.8*  --   PLT  --  170  --   APTT 45* 117* 105*  LABPROT 19.0*  --   --   INR 1.57  --   --   HEPARINUNFRC 2.18* 1.20*  --   CREATININE 2.10*  --   --     Estimated Creatinine Clearance: 32.5 mL/min (by C-G formula based on SCr of 2.1 mg/dL (H)).   Assessment: 65 y/o M on Xarelto PTA for afib, heparin while Xarelto on hold in the setting of occluded axillofemoral bypass.   Currently using aPTTs to dose heparin for now due to Xarelto influence on anti-Xa levels.   APTT remains above goal (105 seconds) on 850 units/hr.   Planning surgery 10/27 am.  Goal of Therapy:  Heparin level 0.3-0.7 units/ml aPTT 66-102 seconds Monitor platelets by anticoagulation protocol: Yes   Plan:   Decrease heparin to 750 units/hr  aPTT ~6-8 hrs after decrease.  Daily aPTT, heparin level and  CBC.   Dennie FettersEgan, Corretta Munce Donovan, RPh Pager: [phone] 08/15/2016,2:48 PM

## 2016-08-15 NOTE — Progress Notes (Signed)
ANTICOAGULATION CONSULT NOTE - Follow Up Consult  Pharmacy Consult for Heparin  Indication: atrial fibrillation and occluded right axillofemoral bypass  Allergies  Allergen Reactions  . Metformin And Related Diarrhea and Nausea And Vomiting  . Penicillins Swelling    SWELLING REACTION UNSPECIFIED  Has patient had a PCN reaction causing immediate rash, facial/tongue/throat swelling, SOB or lightheadedness with hypotension: Yes Has patient had a PCN reaction causing severe rash involving mucus membranes or skin necrosis: No Has patient had a PCN reaction that required hospitalization No Has patient had a PCN reaction occurring within the last 10 years: No If all of the above answers are "NO", then may proceed with Cephalosporin use.     Patient Measurements: Height: 5\' 6"  (167.6 cm) Weight: 152 lb (68.9 kg) IBW/kg (Calculated) : 63.8  Heparin dosing weight 68.9 kg  Vital Signs: Temp: 97.9 F (36.6 C) (10/26 1900) Temp Source: Oral (10/26 1900) BP: 119/47 (10/26 1900) Pulse Rate: 53 (10/26 1900)  Labs:  Recent Labs  08/14/16 1627 08/15/16 0239 08/15/16 1223 08/15/16 1517 08/15/16 2208  HGB  --  12.3*  --   --   --   HCT  --  36.8*  --   --   --   PLT  --  170  --   --   --   APTT 45* 117* 105*  --  93*  LABPROT 19.0*  --   --   --   --   INR 1.57  --   --   --   --   HEPARINUNFRC 2.18* 1.20*  --   --   --   CREATININE 2.10*  --   --  1.97*  --     Estimated Creatinine Clearance: 34.6 mL/min (by C-G formula based on SCr of 1.97 mg/dL (H)).   Assessment: 65 y/o M on Xarelto PTA for afib, heparin while Xarelto on hold in the setting of occluded axillofemoral bypass.   Currently using aPTTs to dose heparin for now due to Xarelto influence on anti-Xa levels.    APTT therapeutic (93 seconds) on 750 units/hr.   Planning surgery 10/27 am.  Goal of Therapy:   Heparin level 0.3-0.7 units/ml aPTT 66-102 seconds Monitor platelets by anticoagulation protocol: Yes    Plan:  Continue heparin at 750 units/hr  Daily aPTT, heparin level and CBC.   Smitty Cordsony L Srihari Shellhammer, RPh Pager: [phone] 08/15/2016,10:43 PM

## 2016-08-16 ENCOUNTER — Inpatient Hospital Stay (HOSPITAL_COMMUNITY): Payer: Medicare Other | Admitting: Certified Registered"

## 2016-08-16 ENCOUNTER — Encounter (HOSPITAL_COMMUNITY)
Admission: AD | Disposition: A | Discharge status: Home / Self Care | Payer: Self-pay | Source: Ambulatory Visit | Attending: Vascular Surgery

## 2016-08-16 ENCOUNTER — Encounter (HOSPITAL_COMMUNITY): Payer: Self-pay | Admitting: Certified Registered"

## 2016-08-16 DIAGNOSIS — N183 Chronic kidney disease, stage 3 (moderate): Secondary | ICD-10-CM

## 2016-08-16 DIAGNOSIS — N170 Acute kidney failure with tubular necrosis: Secondary | ICD-10-CM

## 2016-08-16 DIAGNOSIS — T82858A Stenosis of vascular prosthetic devices, implants and grafts, initial encounter: Secondary | ICD-10-CM

## 2016-08-16 DIAGNOSIS — I998 Other disorder of circulatory system: Secondary | ICD-10-CM

## 2016-08-16 HISTORY — PX: THROMBECTOMY FEMORAL ARTERY: SHX6406

## 2016-08-16 HISTORY — PX: FEMORAL-FEMORAL BYPASS GRAFT: SHX936

## 2016-08-16 LAB — APTT: aPTT: 88 seconds — ABNORMAL HIGH (ref 24–36)

## 2016-08-16 LAB — BASIC METABOLIC PANEL
Anion gap: 6 (ref 5–15)
BUN: 24 mg/dL — AB (ref 6–20)
CO2: 27 mmol/L (ref 22–32)
CREATININE: 2.04 mg/dL — AB (ref 0.61–1.24)
Calcium: 8.8 mg/dL — ABNORMAL LOW (ref 8.9–10.3)
Chloride: 106 mmol/L (ref 101–111)
GFR calc Af Amer: 38 mL/min — ABNORMAL LOW (ref >60)
GFR, EST NON AFRICAN AMERICAN: 33 mL/min — AB (ref >60)
Glucose, Bld: 123 mg/dL — ABNORMAL HIGH (ref 65–99)
Potassium: 4.4 mmol/L (ref 3.5–5.1)
SODIUM: 139 mmol/L (ref 135–145)

## 2016-08-16 LAB — CBC
HCT: 37.3 % — ABNORMAL LOW (ref 39.0–52.0)
Hemoglobin: 12.5 g/dL — ABNORMAL LOW (ref 13.0–17.0)
MCH: 32.7 pg (ref 26.0–34.0)
MCHC: 33.5 g/dL (ref 30.0–36.0)
MCV: 97.6 fL (ref 78.0–100.0)
PLATELETS: 177 10*3/uL (ref 150–400)
RBC: 3.82 MIL/uL — ABNORMAL LOW (ref 4.22–5.81)
RDW: 12.5 % (ref 11.5–15.5)
WBC: 7.6 10*3/uL (ref 4.0–10.5)

## 2016-08-16 LAB — HEPARIN LEVEL (UNFRACTIONATED): HEPARIN UNFRACTIONATED: 0.47 [IU]/mL (ref 0.30–0.70)

## 2016-08-16 LAB — MRSA PCR SCREENING: MRSA BY PCR: NEGATIVE

## 2016-08-16 LAB — GLUCOSE, CAPILLARY: Glucose-Capillary: 130 mg/dL — ABNORMAL HIGH (ref 65–99)

## 2016-08-16 SURGERY — THROMBECTOMY, ARTERY, FEMORAL
Anesthesia: General | Site: Groin | Laterality: Right

## 2016-08-16 MED ORDER — HEPARIN SODIUM (PORCINE) 1000 UNIT/ML IJ SOLN
INTRAMUSCULAR | Status: DC | PRN
  Administered 2016-08-16: 7000 [IU] via INTRAVENOUS

## 2016-08-16 MED ORDER — HEPARIN SODIUM (PORCINE) 1000 UNIT/ML IJ SOLN
INTRAMUSCULAR | Status: AC
  Filled 2016-08-16: qty 1

## 2016-08-16 MED ORDER — HYDROMORPHONE HCL 1 MG/ML IJ SOLN
0.2500 mg | INTRAMUSCULAR | Status: DC | PRN
  Administered 2016-08-16 (×4): 0.5 mg via INTRAVENOUS

## 2016-08-16 MED ORDER — 0.9 % SODIUM CHLORIDE (POUR BTL) OPTIME
TOPICAL | Status: DC | PRN
  Administered 2016-08-16: 2000 mL

## 2016-08-16 MED ORDER — IOPAMIDOL (ISOVUE-300) INJECTION 61%
INTRAVENOUS | Status: AC
Start: 2016-08-16 — End: 2016-08-16
  Filled 2016-08-16: qty 50

## 2016-08-16 MED ORDER — ROCURONIUM BROMIDE 10 MG/ML (PF) SYRINGE
PREFILLED_SYRINGE | INTRAVENOUS | Status: AC
  Filled 2016-08-16: qty 10

## 2016-08-16 MED ORDER — GLYCOPYRROLATE 0.2 MG/ML IJ SOLN
INTRAMUSCULAR | Status: DC | PRN
  Administered 2016-08-16: 0.2 mg via INTRAVENOUS

## 2016-08-16 MED ORDER — LACTATED RINGERS IV SOLN
INTRAVENOUS | Status: DC | PRN
  Administered 2016-08-16: 10:00:00 via INTRAVENOUS

## 2016-08-16 MED ORDER — MIDAZOLAM HCL 2 MG/2ML IJ SOLN
INTRAMUSCULAR | Status: AC
Start: 2016-08-16 — End: 2016-08-16
  Filled 2016-08-16: qty 2

## 2016-08-16 MED ORDER — GUAIFENESIN-DM 100-10 MG/5ML PO SYRP
15.0000 mL | ORAL_SOLUTION | ORAL | Status: DC | PRN
  Administered 2016-08-18: 15 mL via ORAL
  Filled 2016-08-16: qty 15

## 2016-08-16 MED ORDER — LACTATED RINGERS IV SOLN
INTRAVENOUS | Status: DC | PRN
  Administered 2016-08-16 (×2): via INTRAVENOUS

## 2016-08-16 MED ORDER — PROPOFOL 10 MG/ML IV BOLUS
INTRAVENOUS | Status: DC | PRN
  Administered 2016-08-16: 130 mg via INTRAVENOUS
  Administered 2016-08-16: 20 mg via INTRAVENOUS

## 2016-08-16 MED ORDER — FENTANYL CITRATE (PF) 100 MCG/2ML IJ SOLN
INTRAMUSCULAR | Status: AC
  Filled 2016-08-16: qty 4

## 2016-08-16 MED ORDER — POTASSIUM CHLORIDE CRYS ER 20 MEQ PO TBCR: 20.0000 meq | EXTENDED_RELEASE_TABLET | Freq: Every day | ORAL | Status: DC | PRN

## 2016-08-16 MED ORDER — VANCOMYCIN HCL IN DEXTROSE 1-5 GM/200ML-% IV SOLN: 1000.0000 mg | Freq: Two times a day (BID) | INTRAVENOUS | Status: DC

## 2016-08-16 MED ORDER — ROCURONIUM BROMIDE 10 MG/ML (PF) SYRINGE
PREFILLED_SYRINGE | INTRAVENOUS | Status: DC | PRN
  Administered 2016-08-16: 40 mg via INTRAVENOUS

## 2016-08-16 MED ORDER — MORPHINE SULFATE (PF) 2 MG/ML IV SOLN
2.0000 mg | INTRAVENOUS | Status: DC | PRN
  Administered 2016-08-16 – 2016-08-17 (×4): 2 mg via INTRAVENOUS
  Filled 2016-08-16: qty 1
  Filled 2016-08-16: qty 2.5
  Filled 2016-08-16 (×3): qty 1

## 2016-08-16 MED ORDER — DOCUSATE SODIUM 100 MG PO CAPS
100.0000 mg | ORAL_CAPSULE | Freq: Every day | ORAL | Status: DC
  Administered 2016-08-18: 100 mg via ORAL
  Filled 2016-08-16: qty 1

## 2016-08-16 MED ORDER — NITROGLYCERIN IN D5W 200-5 MCG/ML-% IV SOLN: INTRAVENOUS | Status: DC | PRN

## 2016-08-16 MED ORDER — LABETALOL HCL 5 MG/ML IV SOLN: 10.0000 mg | INTRAVENOUS | Status: DC | PRN

## 2016-08-16 MED ORDER — PHENYLEPHRINE 40 MCG/ML (10ML) SYRINGE FOR IV PUSH (FOR BLOOD PRESSURE SUPPORT)
PREFILLED_SYRINGE | INTRAVENOUS | Status: AC
  Filled 2016-08-16: qty 10

## 2016-08-16 MED ORDER — LIDOCAINE 2% (20 MG/ML) 5 ML SYRINGE
INTRAMUSCULAR | Status: DC | PRN
  Administered 2016-08-16: 100 mg via INTRAVENOUS

## 2016-08-16 MED ORDER — PROTAMINE SULFATE 10 MG/ML IV SOLN
INTRAVENOUS | Status: AC
  Filled 2016-08-16: qty 5

## 2016-08-16 MED ORDER — FENTANYL CITRATE (PF) 100 MCG/2ML IJ SOLN
INTRAMUSCULAR | Status: AC
  Filled 2016-08-16: qty 2

## 2016-08-16 MED ORDER — SODIUM CHLORIDE 0.9 % IV SOLN: 500.0000 mL | Freq: Once | INTRAVENOUS | Status: DC | PRN

## 2016-08-16 MED ORDER — ALUM & MAG HYDROXIDE-SIMETH 200-200-20 MG/5ML PO SUSP: 15.0000 mL | ORAL | Status: DC | PRN

## 2016-08-16 MED ORDER — PANTOPRAZOLE SODIUM 40 MG PO TBEC
40.0000 mg | DELAYED_RELEASE_TABLET | Freq: Every day | ORAL | Status: DC
  Administered 2016-08-16 – 2016-08-18 (×3): 40 mg via ORAL
  Filled 2016-08-16 (×3): qty 1

## 2016-08-16 MED ORDER — SUGAMMADEX SODIUM 200 MG/2ML IV SOLN
INTRAVENOUS | Status: DC | PRN
  Administered 2016-08-16: 100 mg via INTRAVENOUS

## 2016-08-16 MED ORDER — METOPROLOL TARTRATE 5 MG/5ML IV SOLN: 2.0000 mg | INTRAVENOUS | Status: DC | PRN

## 2016-08-16 MED ORDER — HYDROCODONE-ACETAMINOPHEN 5-325 MG PO TABS: 1.0000 | ORAL_TABLET | ORAL | 0 refills | Status: DC | PRN

## 2016-08-16 MED ORDER — SODIUM CHLORIDE 0.9 % IV SOLN
INTRAVENOUS | Status: DC
  Administered 2016-08-16: 19:00:00 via INTRAVENOUS

## 2016-08-16 MED ORDER — ACETAMINOPHEN 325 MG PO TABS: 325.0000 mg | ORAL_TABLET | ORAL | Status: DC | PRN

## 2016-08-16 MED ORDER — PROMETHAZINE HCL 25 MG/ML IJ SOLN
6.2500 mg | INTRAMUSCULAR | Status: DC | PRN
Start: 2016-08-16 — End: 2016-08-16

## 2016-08-16 MED ORDER — HYDROMORPHONE HCL 2 MG/ML IJ SOLN
INTRAMUSCULAR | Status: AC
  Administered 2016-08-16: 0.5 mg
  Filled 2016-08-16: qty 1

## 2016-08-16 MED ORDER — MIDAZOLAM HCL 2 MG/2ML IJ SOLN
INTRAMUSCULAR | Status: AC
  Filled 2016-08-16: qty 2

## 2016-08-16 MED ORDER — HYDRALAZINE HCL 20 MG/ML IJ SOLN: 5.0000 mg | INTRAMUSCULAR | Status: DC | PRN

## 2016-08-16 MED ORDER — GLYCOPYRROLATE 0.2 MG/ML IV SOSY
PREFILLED_SYRINGE | INTRAVENOUS | Status: AC
  Filled 2016-08-16: qty 3

## 2016-08-16 MED ORDER — LIDOCAINE 2% (20 MG/ML) 5 ML SYRINGE
INTRAMUSCULAR | Status: AC
  Filled 2016-08-16: qty 5

## 2016-08-16 MED ORDER — HYDROMORPHONE HCL 2 MG/ML IJ SOLN
INTRAMUSCULAR | Status: AC
  Filled 2016-08-16: qty 1

## 2016-08-16 MED ORDER — VANCOMYCIN HCL IN DEXTROSE 1-5 GM/200ML-% IV SOLN
1000.0000 mg | Freq: Once | INTRAVENOUS | Status: AC
  Administered 2016-08-17: 1000 mg via INTRAVENOUS
  Filled 2016-08-16: qty 200

## 2016-08-16 MED ORDER — ALBUMIN HUMAN 5 % IV SOLN
INTRAVENOUS | Status: DC | PRN
  Administered 2016-08-16: 13:00:00 via INTRAVENOUS

## 2016-08-16 MED ORDER — FENTANYL CITRATE (PF) 100 MCG/2ML IJ SOLN
INTRAMUSCULAR | Status: DC | PRN
  Administered 2016-08-16 (×2): 50 ug via INTRAVENOUS
  Administered 2016-08-16: 25 ug via INTRAVENOUS
  Administered 2016-08-16 (×2): 50 ug via INTRAVENOUS
  Administered 2016-08-16: 75 ug via INTRAVENOUS

## 2016-08-16 MED ORDER — PROTAMINE SULFATE 10 MG/ML IV SOLN
INTRAVENOUS | Status: DC | PRN
  Administered 2016-08-16: 50 mg via INTRAVENOUS

## 2016-08-16 MED ORDER — MAGNESIUM SULFATE 2 GM/50ML IV SOLN
2.0000 g | Freq: Every day | INTRAVENOUS | Status: DC | PRN
  Filled 2016-08-16: qty 50

## 2016-08-16 MED ORDER — NITROGLYCERIN IN D5W 200-5 MCG/ML-% IV SOLN
INTRAVENOUS | Status: DC | PRN
  Administered 2016-08-16: 16.6 ug/min via INTRAVENOUS

## 2016-08-16 MED ORDER — PHENOL 1.4 % MT LIQD: 1.0000 | OROMUCOSAL | Status: DC | PRN

## 2016-08-16 MED ORDER — RIVAROXABAN 20 MG PO TABS: 20.0000 mg | ORAL_TABLET | Freq: Every day | ORAL | Status: DC

## 2016-08-16 MED ORDER — MORPHINE SULFATE (PF) 2 MG/ML IV SOLN
INTRAVENOUS | Status: AC
  Filled 2016-08-16: qty 1

## 2016-08-16 MED ORDER — SODIUM CHLORIDE 0.9 % IV SOLN
INTRAVENOUS | Status: DC | PRN
  Administered 2016-08-16: 12:00:00

## 2016-08-16 MED ORDER — PHENYLEPHRINE HCL 10 MG/ML IJ SOLN
INTRAMUSCULAR | Status: DC | PRN
  Administered 2016-08-16: 40 ug/min via INTRAVENOUS

## 2016-08-16 MED ORDER — ONDANSETRON HCL 4 MG/2ML IJ SOLN
4.0000 mg | Freq: Four times a day (QID) | INTRAMUSCULAR | Status: DC | PRN
  Administered 2016-08-16 – 2016-08-17 (×3): 4 mg via INTRAVENOUS
  Filled 2016-08-16 (×3): qty 2

## 2016-08-16 MED ORDER — PROTAMINE SULFATE 10 MG/ML IV SOLN
INTRAVENOUS | Status: AC
  Filled 2016-08-16: qty 15

## 2016-08-16 MED ORDER — NITROGLYCERIN 0.2 MG/ML ON CALL CATH LAB: INTRAVENOUS | Status: DC | PRN

## 2016-08-16 MED ORDER — PHENYLEPHRINE 40 MCG/ML (10ML) SYRINGE FOR IV PUSH (FOR BLOOD PRESSURE SUPPORT)
PREFILLED_SYRINGE | INTRAVENOUS | Status: DC | PRN
  Administered 2016-08-16: 80 ug via INTRAVENOUS

## 2016-08-16 MED ORDER — RIVAROXABAN 15 MG PO TABS
15.0000 mg | ORAL_TABLET | Freq: Every day | ORAL | Status: DC
  Administered 2016-08-17: 15 mg via ORAL
  Filled 2016-08-16: qty 1

## 2016-08-16 MED ORDER — MIDAZOLAM HCL 5 MG/5ML IJ SOLN
INTRAMUSCULAR | Status: DC | PRN
  Administered 2016-08-16 (×2): 1 mg via INTRAVENOUS

## 2016-08-16 MED ORDER — ONDANSETRON HCL 4 MG/2ML IJ SOLN
INTRAMUSCULAR | Status: AC
  Filled 2016-08-16: qty 2

## 2016-08-16 MED ORDER — SUGAMMADEX SODIUM 200 MG/2ML IV SOLN
INTRAVENOUS | Status: AC
Start: 2016-08-16 — End: 2016-08-16
  Filled 2016-08-16: qty 2

## 2016-08-16 MED ORDER — PROPOFOL 10 MG/ML IV BOLUS
INTRAVENOUS | Status: AC
  Filled 2016-08-16: qty 20

## 2016-08-16 MED ORDER — ONDANSETRON HCL 4 MG/2ML IJ SOLN
INTRAMUSCULAR | Status: DC | PRN
  Administered 2016-08-16: 4 mg via INTRAVENOUS

## 2016-08-16 MED ORDER — ACETAMINOPHEN 325 MG RE SUPP: 325.0000 mg | RECTAL | Status: DC | PRN

## 2016-08-16 MED ORDER — LACTATED RINGERS IV SOLN
INTRAVENOUS | Status: DC
  Administered 2016-08-16: 09:00:00 via INTRAVENOUS

## 2016-08-16 SURGICAL SUPPLY — 48 items
BENZOIN TINCTURE PRP APPL 2/3 (GAUZE/BANDAGES/DRESSINGS) ×6 IMPLANT
CANISTER SUCTION 2500CC (MISCELLANEOUS) ×3 IMPLANT
CANNULA VESSEL 3MM 2 BLNT TIP (CANNULA) ×6 IMPLANT
CATH EMB 4FR 80CM (CATHETERS) ×3 IMPLANT
CLIP LIGATING EXTRA MED SLVR (CLIP) ×3 IMPLANT
CLIP LIGATING EXTRA SM BLUE (MISCELLANEOUS) ×3 IMPLANT
DRAIN SNY 10X20 3/4 PERF (WOUND CARE) IMPLANT
DRAPE INCISE IOBAN 66X45 STRL (DRAPES) ×3 IMPLANT
DRSG COVADERM 4X6 (GAUZE/BANDAGES/DRESSINGS) ×6 IMPLANT
ELECT REM PT RETURN 9FT ADLT (ELECTROSURGICAL) ×3
ELECTRODE REM PT RTRN 9FT ADLT (ELECTROSURGICAL) ×2 IMPLANT
EVACUATOR SILICONE 100CC (DRAIN) IMPLANT
GLOVE BIO SURGEON STRL SZ 6.5 (GLOVE) ×3 IMPLANT
GLOVE BIOGEL PI IND STRL 6.5 (GLOVE) ×10 IMPLANT
GLOVE BIOGEL PI IND STRL 7.0 (GLOVE) ×2 IMPLANT
GLOVE BIOGEL PI IND STRL 7.5 (GLOVE) ×2 IMPLANT
GLOVE BIOGEL PI INDICATOR 6.5 (GLOVE) ×5
GLOVE BIOGEL PI INDICATOR 7.0 (GLOVE) ×1
GLOVE BIOGEL PI INDICATOR 7.5 (GLOVE) ×1
GLOVE ECLIPSE 6.5 STRL STRAW (GLOVE) ×6 IMPLANT
GLOVE ECLIPSE 7.0 STRL STRAW (GLOVE) ×3 IMPLANT
GLOVE SS BIOGEL STRL SZ 7.5 (GLOVE) ×2 IMPLANT
GLOVE SUPERSENSE BIOGEL SZ 7.5 (GLOVE) ×1
GLOVE SURG SS PI 6.0 STRL IVOR (GLOVE) ×3 IMPLANT
GLOVE SURG SS PI 6.5 STRL IVOR (GLOVE) ×3 IMPLANT
GOWN STRL NON-REIN LRG LVL3 (GOWN DISPOSABLE) ×3 IMPLANT
GOWN STRL REUS W/ TWL LRG LVL3 (GOWN DISPOSABLE) ×12 IMPLANT
GOWN STRL REUS W/TWL LRG LVL3 (GOWN DISPOSABLE) ×6
GRAFT HEMASHIELD 8MM (Vascular Products) ×1 IMPLANT
GRAFT VASC STRG 30X8KNIT (Vascular Products) ×2 IMPLANT
INSERT FOGARTY SM (MISCELLANEOUS) ×3 IMPLANT
KIT BASIN OR (CUSTOM PROCEDURE TRAY) ×3 IMPLANT
KIT ROOM TURNOVER OR (KITS) ×3 IMPLANT
NS IRRIG 1000ML POUR BTL (IV SOLUTION) ×6 IMPLANT
PACK PERIPHERAL VASCULAR (CUSTOM PROCEDURE TRAY) ×3 IMPLANT
PAD ARMBOARD 7.5X6 YLW CONV (MISCELLANEOUS) ×6 IMPLANT
STRIP CLOSURE SKIN 1/2X4 (GAUZE/BANDAGES/DRESSINGS) ×6 IMPLANT
SUT ETHILON 3 0 PS 1 (SUTURE) IMPLANT
SUT PROLENE 5 0 C 1 24 (SUTURE) ×6 IMPLANT
SUT PROLENE 6 0 CC (SUTURE) ×6 IMPLANT
SUT SILK 2 0 SH (SUTURE) ×3 IMPLANT
SUT VIC AB 2-0 CTX 36 (SUTURE) ×6 IMPLANT
SUT VIC AB 3-0 SH 27 (SUTURE) ×2
SUT VIC AB 3-0 SH 27X BRD (SUTURE) ×4 IMPLANT
SYR 3ML LL SCALE MARK (SYRINGE) ×3 IMPLANT
TRAY FOLEY W/METER SILVER 16FR (SET/KITS/TRAYS/PACK) ×3 IMPLANT
UNDERPAD 30X30 (UNDERPADS AND DIAPERS) ×3 IMPLANT
WATER STERILE IRR 1000ML POUR (IV SOLUTION) ×3 IMPLANT

## 2016-08-16 NOTE — Progress Notes (Signed)
Pt sent to O.R repot called to or nurse

## 2016-08-16 NOTE — Progress Notes (Signed)
Patient Name: Anthony Meadows Date of Encounter: 08/16/2016  Primary Cardiologist: Lucienne Minks [facility])  [facility] Problem List     Active Problems:   CAD (coronary artery disease), native coronary artery   Atrial fibrillation (HCC)   Chronic systolic heart failure (HCC)   Pre-op evaluation   Ischemia of lower extremity   Thrombosis of arterial graft (HCC)   Acute renal failure superimposed on stage 3 chronic kidney disease (HCC)     Subjective   Already in the operating room. No cardiac difficulty overnight.  Inpatient Medications    Scheduled Meds: . [MAR Hold] amiodarone  100 mg Oral Daily  . [MAR Hold] atorvastatin  80 mg Oral QHS  . [MAR Hold] carvedilol  25 mg Oral BID WC  . docusate sodium  100 mg Oral BID  . [MAR Hold] metFORMIN  500 mg Oral BID PC  . pantoprazole  40 mg Oral Daily  . potassium chloride  20-40 mEq Oral Once  . [MAR Hold] torsemide  20 mg Oral Daily   Continuous Infusions: . lactated ringers 50 mL/hr at 08/16/16 0919   PRN Meds: [MAR Hold] albuterol, alum & mag hydroxide-simeth, [MAR Hold] bismuth subsalicylate, guaiFENesin-dextromethorphan, hydrALAZINE, [MAR Hold] HYDROcodone-acetaminophen, labetalol, [MAR Hold] LORazepam, metoprolol, morphine injection, ondansetron, phenol, polyethylene glycol   Vital Signs    Vitals:   08/15/16 0808 08/15/16 1600 08/15/16 1900 08/16/16 0456  BP: (!) 115/50 (!) 120/52 (!) 119/47 (!) 111/52  Pulse: (!) 53 (!) 53 (!) 53 66  Resp:   20 18  Temp:   97.9 F (36.6 C) 97.8 F (36.6 C)  TempSrc:   Oral Oral  SpO2:   97% 95%  Weight:      Height:        Intake/Output Summary (Last 24 hours) at 08/16/16 1125 Last data filed at 08/15/16 1625  Gross per 24 hour  Intake              240 ml  Output                0 ml  Net              240 ml   Filed Weights   08/14/16 1728  Weight: 152 lb (68.9 kg)    Physical Exam    No examined this AM  Labs    CBC  Recent Labs  08/15/16 0239 08/16/16 0223  WBC  9.0 7.6  HGB 12.3* 12.5*  HCT 36.8* 37.3*  MCV 97.6 97.6  PLT 170 177   Basic Metabolic Panel  Recent Labs  08/15/16 1517 08/16/16 0223  NA 140 139  K 4.8 4.4  CL 105 106  CO2 29 27  GLUCOSE 156* 123*  BUN 24* 24*  CREATININE 1.97* 2.04*  CALCIUM 8.8* 8.8*   Liver Function Tests  Recent Labs  08/14/16 1627  AST 14*  ALT 13*  ALKPHOS 25*  BILITOT 0.9  PROT 6.2*  ALBUMIN 3.3*   No results for input(s): LIPASE, AMYLASE in the last 72 hours. Cardiac Enzymes No results for input(s): CKTOTAL, CKMB, CKMBINDEX, TROPONINI in the last 72 hours. BNP Invalid input(s): POCBNP D-Dimer No results for input(s): DDIMER in the last 72 hours. Hemoglobin A1C No results for input(s): HGBA1C in the last 72 hours. Fasting Lipid Panel No results for input(s): CHOL, HDL, LDLCALC, TRIG, CHOLHDL, LDLDIRECT in the last 72 hours. Thyroid Function Tests No results for input(s): TSH, T4TOTAL, T3FREE, THYROIDAB in the last 72 hours.  Invalid input(s):  FREET3  Telemetry    Normal sinus rhythm overnight. No atrial fib. - Personally Reviewed  ECG    No new data. EKG from last p.m. revealed normal sinus rhythm, interventricular conduction delay (incomplete left bundle branch block). No acute abnormality noted.- Personally Reviewed  Radiology    Ct Angio Ao+bifem W &/or Wo Contrast  Result Date: 08/15/2016 CLINICAL DATA:  Peripheral vascular disease EXAM: CT ANGIOGRAPHY OF ABDOMINAL AORTA WITH ILIOFEMORAL RUNOFF TECHNIQUE: Multidetector CT imaging of the abdomen, pelvis and lower extremities was performed using the standard protocol during bolus administration of intravenous contrast. Multiplanar CT image reconstructions and MIPs were obtained to evaluate the vascular anatomy. CONTRAST:  80 cc Isovue 370 COMPARISON:  04/10/2015 FINDINGS: VASCULAR Aorta: The right axillofemoral bypass graft is occluded. There is extensive irregular atherosclerotic plaque involving the abdominal aorta  throughout its length. It is non aneurysmal. Celiac: Patent. SMA: Patent. Renals: Single bilateral renal arteries are patent. IMA: There is disease at the origin. The vessel is there after patent. The superior hemorrhoidal branch is hypertrophied providing some flow to the right lower extremity. Lower Extremity Inflow: Right common and external iliac arteries are occluded. Right internal iliac artery trunk is occluded. Branches reconstitute. There is diffuse atherosclerotic disease of the left common and external iliac arteries without significant focal narrowing. Left internal iliac artery is patent. Outflow: Right common femoral artery is patent. The superficial femoral artery is occluded. A femoral to popliteal bypass graft is patent. There is layering of contrast in the graft with non-opacified blood laying above the contrast in a supine patient. This is similar to mixing artifact. Profunda femoral artery branches are patent. There is severe disease involving the tibial vessels without a single continuous vessel to the ankle. This is not significantly changed. Left common femoral artery is patent. Profunda femoral artery branches are patent. The superficial femoral artery is severely diseased then becomes occluded in the mid thigh. The popliteal artery reconstitutes just beyond the adductor canal. The popliteal artery is occluded below the knee. Peroneal and anterior tibial arteries reconstitute via collateral geniculate vessels. Runoff: See above. Lower Extremity Inflow: See above. Outflow: See above. Runoff: See above. Veins: The study was performed in arterial phase. Venous structures are non-opacified. Review of the MIP images confirms the above findings. NON-VASCULAR Lower chest: Small right pleural effusion is unchanged. Hepatobiliary: Visualized liver is unremarkable.  Small gallstones. Pancreas: Unremarkable Spleen: Unremarkable Adrenals/Urinary Tract: Left adrenal nodule is stable. Right adrenal gland  is unremarkable. Several simple cysts in the kidneys are stable. A lateral cyst emanating from the left kidney is minimally complicated with peripheral posterior calcification. Stomach/Bowel: Diverticulosis in the ascending colon is unchanged. Normal appendix. No evidence of small-bowel obstruction. Lymphatic: No obvious retroperitoneal adenopathy. Reproductive: Prostate and bladder are unremarkable. Other: Small amount of free fluid layers in the upper right hemipelvis. Musculoskeletal: Exostosis emanating from the region of the proximal left tibia and fibula is unchanged. No vertebral compression deformity. IMPRESSION: VASCULAR Right axilla femoral bypass graft is occluded. Atherosclerotic changes within the aorta are not significantly changed. Right common and external iliac artery occlusion. Left common and external iliac artery are patent. Right femoral to popliteal artery below the knee bypass graft is patent. Severe runoff disease in the right calf is stable. Left superficial femoral artery occlusion. Left popliteal artery reconstitutes above the knee but is occluded below the knee. Two vessel runoff to the left ankle below the occlusion. This is stable. NON-VASCULAR Cholelithiasis. Electronically Signed   By: Merton BorderArthur  Hoss M.D.   On: 08/15/2016 08:04    Cardiac Studies   No new data  Patient Profile     65 year old with complex prior cardiac history including remote artery bypass grafting with no subsequent angiography (high risk myocardial perfusion study 2016: No ischemia/LVEF less than 25%), peripheral vascular disease with CLI right lower extremity, ischemic cardiomyopathy, AICD, paroxysmal atrial fibrillation on amiodarone with rhythm control, chronic systolic heart failure, and chronic kidney disease stage III/IV.  Assessment & Plan    1. Right lower CLI with revascularization planned day by Dr. Arbie Cookey. 2. Stable kidney function overnight post contrast with creatinine of around 2.0. The  patient has chronic kidney disease stage III/IV. Reassess kidney function in a.m. 3. Ischemic cardiomyopathy with EF less than 25% and chronic stable systolic heart failure without evidence of volume overload preop. 4. Coronary artery disease without symptoms of angina in this patient who is status post coronary bypass grafting.  Signed, Lesleigh Noe, MD  08/16/2016, 11:25 AM

## 2016-08-16 NOTE — Progress Notes (Signed)
Pt stopped taking Metformin a couple months after starting due to "GI problems" "vomiting" pt states MD told him to stop taking it.

## 2016-08-16 NOTE — Transfer of Care (Signed)
Immediate Anesthesia Transfer of Care Note  Patient: Lorelee MarketFaron Sandt  Procedure(s) Performed: Procedure(s): THROMBECTOMY AXILLOFEMORAL BYPASS GRAFT (Right) BYPASS GRAFT FEMORAL-FEMORAL ARTERY USING 8MM X 30CM HEMASHIELD GRAFT (Bilateral)  Patient Location: PACU  Anesthesia Type:General  Level of Consciousness: awake, oriented and patient cooperative  Airway & Oxygen Therapy: Patient Spontanous Breathing and Patient connected to nasal cannula oxygen  Post-op Assessment: Report given to RN, Post -op Vital signs reviewed and stable and Patient moving all extremities  Post vital signs: Reviewed and stable  Last Vitals:  Vitals:   08/15/16 1900 08/16/16 0456  BP: (!) 119/47 (!) 111/52  Pulse: (!) 53 66  Resp: 20 18  Temp: 36.6 C 36.6 C    Last Pain:  Vitals:   08/16/16 0456  TempSrc: Oral         Complications: No apparent anesthesia complications

## 2016-08-16 NOTE — Progress Notes (Signed)
ANTICOAGULATION CONSULT NOTE - Follow Up Consult  Pharmacy Consult for Heparin  Indication: atrial fibrillation and occluded right axillofemoral bypass  Allergies  Allergen Reactions  . Metformin And Related Diarrhea and Nausea And Vomiting  . Penicillins Swelling    SWELLING REACTION UNSPECIFIED  Has patient had a PCN reaction causing immediate rash, facial/tongue/throat swelling, SOB or lightheadedness with hypotension: Yes Has patient had a PCN reaction causing severe rash involving mucus membranes or skin necrosis: No Has patient had a PCN reaction that required hospitalization No Has patient had a PCN reaction occurring within the last 10 years: No If all of the above answers are "NO", then may proceed with Cephalosporin use.     Patient Measurements: Height: 5\' 6"  (167.6 cm) Weight: 152 lb (68.9 kg) IBW/kg (Calculated) : 63.8  Heparin dosing weight 68.9 kg  Vital Signs: Temp: 97.8 F (36.6 C) (10/27 0456) Temp Source: Oral (10/27 0456) BP: 111/52 (10/27 0456) Pulse Rate: 66 (10/27 0456)  Labs:  Recent Labs  08/14/16 1627 08/15/16 0239 08/15/16 1223 08/15/16 1517 08/15/16 2208 08/16/16 0223  HGB  --  12.3*  --   --   --  12.5*  HCT  --  36.8*  --   --   --  37.3*  PLT  --  170  --   --   --  177  APTT 45* 117* 105*  --  93* 88*  LABPROT 19.0*  --   --   --   --   --   INR 1.57  --   --   --   --   --   HEPARINUNFRC 2.18* 1.20*  --   --   --  0.47  CREATININE 2.10*  --   --  1.97*  --  2.04*    Estimated Creatinine Clearance: 33.4 mL/min (by C-G formula based on SCr of 2.04 mg/dL (H)).   Assessment: 65 y/o M on Xarelto PTA for afib, continuing on heparin while Xarelto on hold in the setting of occluded axillofemoral bypass. Currently using aPTTs to dose heparin for now due to Xarelto influence on anti-Xa levels.    APTT remains therapeutic (88) on 750 units/hr. Heparin level now correlating (0.47). CBC stable, no bleed documented. Planning surgery 10/27  am.  Goal of Therapy:   Heparin level 0.3-0.7 units/ml aPTT 66-102 seconds Monitor platelets by anticoagulation protocol: Yes   Plan:  Heparin at 750 units/hr Daily CBC/Heparin level/aPTT OR 10/27 AM Per cards note, planning change to warfarin post-op (Xarelto pta)   Babs BertinHaley Nile Prisk, PharmD, BCPS Clinical Pharmacist 08/16/2016 8:57 AM

## 2016-08-16 NOTE — Progress Notes (Signed)
Per Dr Jacklynn BueMassagee will monitor PAP for 1 hour in PACU. Will discontinue if able after 1 hour. Will place patient on correct floor pending this decision.

## 2016-08-16 NOTE — Anesthesia Procedure Notes (Signed)
Procedure Name: Intubation Date/Time: 08/16/2016 11:20 AM Performed by: Charm BargesBUTLER, Frankye Schwegel R Pre-anesthesia Checklist: Patient identified, Emergency Drugs available, Suction available and Patient being monitored Patient Re-evaluated:Patient Re-evaluated prior to inductionOxygen Delivery Method: Circle System Utilized Preoxygenation: Pre-oxygenation with 100% oxygen Intubation Type: IV induction Ventilation: Mask ventilation without difficulty Laryngoscope Size: Mac and 4 Grade View: Grade I Tube type: Oral Tube size: 8.0 mm Number of attempts: 1 Airway Equipment and Method: Stylet and Oral airway Placement Confirmation: ETT inserted through vocal cords under direct vision,  positive ETCO2 and breath sounds checked- equal and bilateral Secured at: 24 cm Tube secured with: Tape Dental Injury: Teeth and Oropharynx as per pre-operative assessment

## 2016-08-16 NOTE — Anesthesia Preprocedure Evaluation (Addendum)
Anesthesia Evaluation  Patient identified by MRN, date of birth, ID band Patient awake    Reviewed: Allergy & Precautions, NPO status , Patient's Chart, lab work & pertinent test results, reviewed documented beta blocker date and time   Airway Mallampati: II  TM Distance: >3 FB Neck ROM: Full    Dental  (+) Teeth Intact, Dental Advisory Given   Pulmonary COPD, Current Smoker, former smoker,    breath sounds clear to auscultation       Cardiovascular hypertension, + CAD, + Past MI, + Peripheral Vascular Disease, +CHF and + Orthopnea  + dysrhythmias Atrial Fibrillation + Cardiac Defibrillator  Rhythm:Regular Rate:Normal     Neuro/Psych    GI/Hepatic   Endo/Other  diabetes, Well Controlled, Type 2, Oral Hypoglycemic Agents  Renal/GU      Musculoskeletal   Abdominal   Peds  Hematology   Anesthesia Other Findings   Reproductive/Obstetrics                             Anesthesia Physical  Anesthesia Plan  ASA: IV  Anesthesia Plan: General   Post-op Pain Management:    Induction: Intravenous  Airway Management Planned: Oral ETT  Additional Equipment: Arterial line, PA Cath and Ultrasound Guidance Line Placement  Intra-op Plan:   Post-operative Plan: Extubation in OR  Informed Consent: I have reviewed the patients History and Physical, chart, labs and discussed the procedure including the risks, benefits and alternatives for the proposed anesthesia with the patient or authorized representative who has indicated his/her understanding and acceptance.   Dental advisory given  Plan Discussed with: CRNA, Anesthesiologist and Surgeon  Anesthesia Plan Comments:        Anesthesia Quick Evaluation

## 2016-08-16 NOTE — Op Note (Signed)
OPERATIVE REPORT  DATE OF SURGERY: 08/16/2016  PATIENT: Anthony Meadows, 65 y.o. male MRN: [id number]  DOB: 01/24/1951  PRE-OPERATIVE DIAGNOSIS: Right Leg ischemia with occluded right axillofemoral bypass  POST-OPERATIVE DIAGNOSIS:  Same  PROCEDURE: #1 thrombectomy of right axillofemoral bypass, #2 left to right femorofemoral bypass  SURGEON:  Gretta Began, M.D.  PHYSICIAN ASSISTANT: Lianne Cure PA-C  ANESTHESIA:  Gen.  EBL: 150 ml  Total I/O In: 2600 [I.V.:2350; IV Piggyback:250] Out: 510 [Urine:360; Blood:150]  BLOOD ADMINISTERED: None  DRAINS: None  SPECIMEN: None  COUNTS CORRECT:  YES  PLAN OF CARE: PACU   PATIENT DISPOSITION:  PACU - hemodynamically stable  PROCEDURE DETAILS: Patient is 65 year old gentleman who is a status post right axillary to femoral bypass and [place] several years ago. He has severe coronary disease and cardiac dysfunction. At that time was felt to be prohibitive risk for aortic surgery. He had had multiple endovascular treatments in [place] including right iliac and right SFA stenting. These had failed and he underwent a right axillary to femoral bypass and right femoral to below-knee popliteal bypass with saphenous vein. He presented [facility] only a year and a half ago with occlusion of his axillofemoral bypass and underwent thrombectomy of this. He presented again approximately 6 weeks ago with reocclusion of his axillofemoral bypass. CT angiogram showed that the right femorofemoral op vein graft remained patent. There was diffuse irregularity throughout the infrarenal aorta and left iliac artery but good flow to the left. The patient did have a palpable left femoral pulse and left dorsalis pedis pulse. Recommended he undergo repeat peak thrombectomy and probable left to right femorofemoral bypass as well to allow for more outflow and also since this may provide inflow should his axillofemoral reoccluded.  Patient was  taken to the operating room strep and draped in usual sterile fashion. Incision was made through the prior groin scar and carried down to isolate the Gore-Tex asked him bypass. The vein femoropopliteal bypass was encircled with a blue vessel loop the fish femoral artery was controlled and the deep femoral artery branches were identified as well. Separate incision was made over the left groin of the left femoral pulse and the common femoral artery was isolated under the inguinal ligament. The artery had posterior plaque and was small caliber. There was a good pulse. A tunnel was created from the level of the left groin to the right groin. Patient was given 7000 units intravenous heparin. After adequate circulation time the L5 vein and deep femoral arteries were reoccluded. The old patch on top of the distal anastomosis of the right axillofemoral was opened. The axillofemoral bypass was thrombectomized with a 4 Fogarty catheter. The arterialized plug was removed with excellent inflow and no evidence of difficulty with this. This was flushed with heparinized and reoccluded. Was excellent backbleeding from the deep femoral artery and also from the femoropopliteal. An 8 Hemashield graft was brought to the right to left femorofemoral tunnel. The graft was spatulated and sewn to the hood of the old graft. The new Dacron graft was placed such that the heel of the graft was on the old Gore-Tex asked him in the toe extended down onto the vein graft area this anastomosis tested and found to be adequate. Next the left common femoral artery was occluded proximally and distally and was opened with 11 blade some ulcerative Potts scissors. The graft was cut to appropriate length and was sewn end-to-side to the common femoral artery with a  running 6-0 Prolene suture. Prior to closure the usual flushing maneuvers were undertaken. Anastomosis completed and the removed and the good flow was noted through the graft. The patient had a 50  mg of protamine to reverse the heparin. Wounds irrigated with saline. Hemostasis tablet cautery. The wounds were closed with 2-0 Vicryl in several layers and subcutaneous tissue. Skin was closed with 3-0 subcuticular Vicryl suture. Sterile dressing was applied and the patient was transferred to the recovery room in stable condition and had the easily audible signals at the posterior tibial level on the right   Anthony Meadows, M.D., [facility]FACS 08/16/2016 2:54 PM

## 2016-08-16 NOTE — Progress Notes (Addendum)
Pharmacy Antibiotic Note  Anthony Meadows is a 65 y.o. male admitted on 08/14/2016 with pain from ischemia.   Pharmacy has been consulted to renally adjust antibiotics post-op.  Patient received vancomycin 1gm IV around 1100 today and his calculated CrCL is 33 ml/min.  Plan: - Change to vancomycin 1gm IV given 24 hours post pre-op dose - Pharmacy will sign off of antibiotic dosing - Spoke to Dr. Arbie CookeyEarly, resume Coumadin 08/17/16   Height: 5\' 6"  (167.6 cm) Weight: 160 lb 4.4 oz (72.7 kg) IBW/kg (Calculated) : 63.8  Temp (24hrs), Avg:97.8 F (36.6 C), Min:97.1 F (36.2 C), Max:98.4 F (36.9 C)   Recent Labs Lab 08/14/16 1627 08/15/16 0239 08/15/16 1517 08/16/16 0223  WBC  --  9.0  --  7.6  CREATININE 2.10*  --  1.97* 2.04*    Estimated Creatinine Clearance: 33.4 mL/min (by C-G formula based on SCr of 2.04 mg/dL (H)).    Allergies  Allergen Reactions  . Metformin And Related Diarrhea and Nausea And Vomiting  . Penicillins Swelling    SWELLING REACTION UNSPECIFIED  Has patient had a PCN reaction causing immediate rash, facial/tongue/throat swelling, SOB or lightheadedness with hypotension: Yes Has patient had a PCN reaction causing severe rash involving mucus membranes or skin necrosis: No Has patient had a PCN reaction that required hospitalization No Has patient had a PCN reaction occurring within the last 10 years: No If all of the above answers are "NO", then may proceed with Cephalosporin use.      Danuel Felicetti D. Laney Potashang, PharmD, BCPS Pager:  [phone] 08/16/2016, 5:14 PM

## 2016-08-17 LAB — CBC
HCT: 31.4 % — ABNORMAL LOW (ref 39.0–52.0)
HEMATOCRIT: 30 % — AB (ref 39.0–52.0)
Hemoglobin: 10.2 g/dL — ABNORMAL LOW (ref 13.0–17.0)
Hemoglobin: 9.9 g/dL — ABNORMAL LOW (ref 13.0–17.0)
MCH: 32.5 pg (ref 26.0–34.0)
MCH: 32.7 pg (ref 26.0–34.0)
MCHC: 32.5 g/dL (ref 30.0–36.0)
MCHC: 33 g/dL (ref 30.0–36.0)
MCV: 100.6 fL — ABNORMAL HIGH (ref 78.0–100.0)
MCV: 98.4 fL (ref 78.0–100.0)
PLATELETS: 126 10*3/uL — AB (ref 150–400)
Platelets: 131 10*3/uL — ABNORMAL LOW (ref 150–400)
RBC: 3.05 MIL/uL — ABNORMAL LOW (ref 4.22–5.81)
RBC: 3.12 MIL/uL — AB (ref 4.22–5.81)
RDW: 12.5 % (ref 11.5–15.5)
RDW: 12.8 % (ref 11.5–15.5)
WBC: 10.8 10*3/uL — ABNORMAL HIGH (ref 4.0–10.5)
WBC: 11.7 10*3/uL — ABNORMAL HIGH (ref 4.0–10.5)

## 2016-08-17 LAB — BASIC METABOLIC PANEL
Anion gap: 4 — ABNORMAL LOW (ref 5–15)
Anion gap: 6 (ref 5–15)
BUN: 21 mg/dL — ABNORMAL HIGH (ref 6–20)
BUN: 22 mg/dL — AB (ref 6–20)
CALCIUM: 8.5 mg/dL — AB (ref 8.9–10.3)
CALCIUM: 8.6 mg/dL — AB (ref 8.9–10.3)
CO2: 26 mmol/L (ref 22–32)
CO2: 26 mmol/L (ref 22–32)
CREATININE: 1.91 mg/dL — AB (ref 0.61–1.24)
CREATININE: 1.91 mg/dL — AB (ref 0.61–1.24)
Chloride: 106 mmol/L (ref 101–111)
Chloride: 108 mmol/L (ref 101–111)
GFR calc Af Amer: 41 mL/min — ABNORMAL LOW (ref >60)
GFR calc non Af Amer: 36 mL/min — ABNORMAL LOW (ref >60)
GFR, EST AFRICAN AMERICAN: 41 mL/min — AB (ref >60)
GFR, EST NON AFRICAN AMERICAN: 36 mL/min — AB (ref >60)
Glucose, Bld: 122 mg/dL — ABNORMAL HIGH (ref 65–99)
Glucose, Bld: 94 mg/dL (ref 65–99)
POTASSIUM: 5.1 mmol/L (ref 3.5–5.1)
Potassium: 4.8 mmol/L (ref 3.5–5.1)
SODIUM: 138 mmol/L (ref 135–145)
SODIUM: 138 mmol/L (ref 135–145)

## 2016-08-17 NOTE — Progress Notes (Signed)
Pt transferred to 2 west in wheelchair on room air by nurse tech. Daughter at bedside. Marisue Ivanobyn Keano Guggenheim RN

## 2016-08-17 NOTE — Progress Notes (Signed)
Subjective: Interval History: none.. Had nausea last night. Resolved this morning. Feels that it is related to metformin. Reports that this occurs after taking this and he was not on this despite it being on his medication form. Will discontinue this.   Objective: Vital signs in last 24 hours: Temp:  [97.1 F (36.2 C)-98.6 F (37 C)] 98.6 F (37 C) (10/28 0708) Pulse Rate:  [59-94] 70 (10/28 0708) Resp:  [13-19] 16 (10/28 0708) BP: (96-138)/(43-74) 136/68 (10/28 0708) SpO2:  [87 %-100 %] 93 % (10/28 0708) Weight:  [160 lb 4.4 oz (72.7 kg)] 160 lb 4.4 oz (72.7 kg) (10/27 1654)  Intake/Output from previous day: 10/27 0701 - 10/28 0700 In: 3161.7 [P.O.:120; I.V.:2751.7; IV Piggyback:250] Out: 1135 [Urine:985; Blood:150] Intake/Output this shift: Total I/O In: 120 [P.O.:120] Out: -   Both groin wound incisions intact with the dressing without hematoma. 2-3+ right popliteal pulse. Excellent femorofemoral graft pulse. Feet well-perfused.  Lab Results:  Recent Labs  08/17/16 0028 08/17/16 0411  WBC 11.7* 10.8*  HGB 10.2* 9.9*  HCT 31.4* 30.0*  PLT 126* 131*   BMET  Recent Labs  08/17/16 0028 08/17/16 0411  NA 138 138  K 5.1 4.8  CL 106 108  CO2 26 26  GLUCOSE 122* 94  BUN 22* 21*  CREATININE 1.91* 1.91*  CALCIUM 8.6* 8.5*    Studies/Results: Ct Angio Ao+bifem W &/or Wo Contrast  Result Date: 08/15/2016 CLINICAL DATA:  Peripheral vascular disease EXAM: CT ANGIOGRAPHY OF ABDOMINAL AORTA WITH ILIOFEMORAL RUNOFF TECHNIQUE: Multidetector CT imaging of the abdomen, pelvis and lower extremities was performed using the standard protocol during bolus administration of intravenous contrast. Multiplanar CT image reconstructions and MIPs were obtained to evaluate the vascular anatomy. CONTRAST:  80 cc Isovue 370 COMPARISON:  04/10/2015 FINDINGS: VASCULAR Aorta: The right axillofemoral bypass graft is occluded. There is extensive irregular atherosclerotic plaque involving the  abdominal aorta throughout its length. It is non aneurysmal. Celiac: Patent. SMA: Patent. Renals: Single bilateral renal arteries are patent. IMA: There is disease at the origin. The vessel is there after patent. The superior hemorrhoidal branch is hypertrophied providing some flow to the right lower extremity. Lower Extremity Inflow: Right common and external iliac arteries are occluded. Right internal iliac artery trunk is occluded. Branches reconstitute. There is diffuse atherosclerotic disease of the left common and external iliac arteries without significant focal narrowing. Left internal iliac artery is patent. Outflow: Right common femoral artery is patent. The superficial femoral artery is occluded. A femoral to popliteal bypass graft is patent. There is layering of contrast in the graft with non-opacified blood laying above the contrast in a supine patient. This is similar to mixing artifact. Profunda femoral artery branches are patent. There is severe disease involving the tibial vessels without a single continuous vessel to the ankle. This is not significantly changed. Left common femoral artery is patent. Profunda femoral artery branches are patent. The superficial femoral artery is severely diseased then becomes occluded in the mid thigh. The popliteal artery reconstitutes just beyond the adductor canal. The popliteal artery is occluded below the knee. Peroneal and anterior tibial arteries reconstitute via collateral geniculate vessels. Runoff: See above. Lower Extremity Inflow: See above. Outflow: See above. Runoff: See above. Veins: The study was performed in arterial phase. Venous structures are non-opacified. Review of the MIP images confirms the above findings. NON-VASCULAR Lower chest: Small right pleural effusion is unchanged. Hepatobiliary: Visualized liver is unremarkable.  Small gallstones. Pancreas: Unremarkable Spleen: Unremarkable Adrenals/Urinary Tract: Left adrenal  nodule is stable. Right  adrenal gland is unremarkable. Several simple cysts in the kidneys are stable. A lateral cyst emanating from the left kidney is minimally complicated with peripheral posterior calcification. Stomach/Bowel: Diverticulosis in the ascending colon is unchanged. Normal appendix. No evidence of small-bowel obstruction. Lymphatic: No obvious retroperitoneal adenopathy. Reproductive: Prostate and bladder are unremarkable. Other: Small amount of free fluid layers in the upper right hemipelvis. Musculoskeletal: Exostosis emanating from the region of the proximal left tibia and fibula is unchanged. No vertebral compression deformity. IMPRESSION: VASCULAR Right axilla femoral bypass graft is occluded. Atherosclerotic changes within the aorta are not significantly changed. Right common and external iliac artery occlusion. Left common and external iliac artery are patent. Right femoral to popliteal artery below the knee bypass graft is patent. Severe runoff disease in the right calf is stable. Left superficial femoral artery occlusion. Left popliteal artery reconstitutes above the knee but is occluded below the knee. Two vessel runoff to the left ankle below the occlusion. This is stable. NON-VASCULAR Cholelithiasis. Electronically Signed   By: Jolaine ClickArthur  Hoss M.D.   On: 08/15/2016 08:04   Anti-infectives: Anti-infectives    Start     Dose/Rate Route Frequency Ordered Stop   08/17/16 1100  vancomycin (VANCOCIN) IVPB 1000 mg/200 mL premix     1,000 mg 200 mL/hr over 60 Minutes Intravenous Once 08/16/16 1715     08/16/16 1715  vancomycin (VANCOCIN) IVPB 1000 mg/200 mL premix  Status:  Discontinued     1,000 mg 200 mL/hr over 60 Minutes Intravenous Every 12 hours 08/16/16 1709 08/16/16 1715   08/16/16 0600  vancomycin (VANCOCIN) IVPB 1000 mg/200 mL premix     1,000 mg 200 mL/hr over 60 Minutes Intravenous On call to O.R. 08/15/16 0939 08/16/16 1105      Assessment/Plan: s/p Procedure(s): THROMBECTOMY AXILLOFEMORAL  BYPASS GRAFT (Right) BYPASS GRAFT FEMORAL-FEMORAL ARTERY USING 8MM X 30CM HEMASHIELD GRAFT (Bilateral) Transfer to 2 west telemetry. Mobilize. Possible disc continue in a. Will resume Xaralto   LOS: 3 days   Gretta Beganarly, Oletta Buehring 08/17/2016, 9:53 AM

## 2016-08-17 NOTE — Progress Notes (Signed)
Patient arrived from [facility] in wheel chair, patient placed on telemetry and verified with CCMD, and vital signs obtained will continue to monitor patient Anthony Meadows, [name] Jessup RN

## 2016-08-17 NOTE — Progress Notes (Signed)
Subjective:  Tolerated operation well yesterday.  No complaints of shortness of breath or chest pain today.  Renal function remained stable.  In sinus rhythm today.  Objective:  Vital Signs in the last 24 hours: BP 136/68 (BP Location: Left Arm)   Pulse 70   Temp 98.6 F (37 C) (Oral)   Resp 16   Ht 5\' 6"  (1.676 m)   Wt 72.7 kg (160 lb 4.4 oz)   SpO2 93%   BMI 25.87 kg/m   Physical Exam: Pleasant male in no acute distress Lungs:  Clear  Cardiac:  Regular rhythm, normal S1 and S2, no S3 Abdomen:  Soft, nontender, no masses Extremities:  Right lower extremity is warm today,   Intake/Output from previous day: 10/27 0701 - 10/28 0700 In: 3161.7 [P.O.:120; I.V.:2751.7; IV Piggyback:250] Out: 1135 [Urine:985; Blood:150] Weight Filed Weights   08/14/16 1728 08/16/16 1654  Weight: 68.9 kg (152 lb) 72.7 kg (160 lb 4.4 oz)    Lab Results: Basic Metabolic Panel:  Recent Labs  16/07/9609/28/17 0028 08/17/16 0411  NA 138 138  K 5.1 4.8  CL 106 108  CO2 26 26  GLUCOSE 122* 94  BUN 22* 21*  CREATININE 1.91* 1.91*    CBC:  Recent Labs  08/17/16 0028 08/17/16 0411  WBC 11.7* 10.8*  HGB 10.2* 9.9*  HCT 31.4* 30.0*  MCV 100.6* 98.4  PLT 126* 131*    BNP    Component Value Date/Time   BNP 1,230.6 (H) 04/10/2015 2200   Telemetry: Sinus rhythm with occasional PACs  Assessment/Plan:  1.  Ischemic cardiomyopathy with stable congestive heart failure with no volume overload 2.  Coronary artery disease with previous bypass grafting with no angina 3.  Stage III chronic kidney disease stable overnight 4.  Doing well following recent revascularization  Recommendations:  Continue current medicines as well as his usual cardiac medicines.  Follow renal function.      Darden PalmerW. Spencer Tilley, Jr.  MD [facility] Card[facility]  08/17/2016, 10:32 AM

## 2016-08-18 LAB — CBC
HEMATOCRIT: 31.7 % — AB (ref 39.0–52.0)
Hemoglobin: 10.2 g/dL — ABNORMAL LOW (ref 13.0–17.0)
MCH: 31.8 pg (ref 26.0–34.0)
MCHC: 32.2 g/dL (ref 30.0–36.0)
MCV: 98.8 fL (ref 78.0–100.0)
Platelets: 126 10*3/uL — ABNORMAL LOW (ref 150–400)
RBC: 3.21 MIL/uL — AB (ref 4.22–5.81)
RDW: 12.5 % (ref 11.5–15.5)
WBC: 12.7 10*3/uL — AB (ref 4.0–10.5)

## 2016-08-18 LAB — BASIC METABOLIC PANEL
ANION GAP: 7 (ref 5–15)
BUN: 20 mg/dL (ref 6–20)
CALCIUM: 9.4 mg/dL (ref 8.9–10.3)
CO2: 28 mmol/L (ref 22–32)
Chloride: 103 mmol/L (ref 101–111)
Creatinine, Ser: 2.17 mg/dL — ABNORMAL HIGH (ref 0.61–1.24)
GFR, EST AFRICAN AMERICAN: 35 mL/min — AB (ref >60)
GFR, EST NON AFRICAN AMERICAN: 31 mL/min — AB (ref >60)
Glucose, Bld: 115 mg/dL — ABNORMAL HIGH (ref 65–99)
POTASSIUM: 4.4 mmol/L (ref 3.5–5.1)
Sodium: 138 mmol/L (ref 135–145)

## 2016-08-18 MED ORDER — RIVAROXABAN 15 MG PO TABS: 15.0000 mg | ORAL_TABLET | Freq: Every day | ORAL | Status: AC

## 2016-08-18 NOTE — Progress Notes (Signed)
Subjective:  No complaints of shortness of breath or chest pain today.  Slight increase in creatinine today.  In sinus rhythm today.  Objective:  Vital Signs in the last 24 hours: BP (!) 117/55 (BP Location: Left Arm)   Pulse 69   Temp 98.4 F (36.9 C) (Oral)   Resp 20   Ht 5\' 6"  (1.676 m)   Wt 72.7 kg (160 lb 4.4 oz)   SpO2 95%   BMI 25.87 kg/m   Physical Exam: Pleasant male in no acute distress Lungs:  Clear  Cardiac:  Regular rhythm, normal S1 and S2, no S3 Abdomen:  Soft, nontender, no masses Extremities:  Right lower extremity is warm today,   Intake/Output from previous day: 10/28 0701 - 10/29 0700 In: 440 [P.O.:240; IV Piggyback:200] Out: 750 [Urine:750] Weight Filed Weights   08/14/16 1728 08/16/16 1654  Weight: 68.9 kg (152 lb) 72.7 kg (160 lb 4.4 oz)    Lab Results: Basic Metabolic Panel:  Recent Labs  40/98/1110/28/17 0411 08/18/16 0330  NA 138 138  K 4.8 4.4  CL 108 103  CO2 26 28  GLUCOSE 94 115*  BUN 21* 20  CREATININE 1.91* 2.17*    CBC:  Recent Labs  08/17/16 0411 08/18/16 0330  WBC 10.8* 12.7*  HGB 9.9* 10.2*  HCT 30.0* 31.7*  MCV 98.4 98.8  PLT 131* 126*    BNP    Component Value Date/Time   BNP 1,230.6 (H) 04/10/2015 2200   Telemetry: Sinus rhythm with occasional PACs  Assessment/Plan:  1.  Ischemic cardiomyopathy with stable congestive heart failure with no volume overload 2.  Coronary artery disease with previous bypass grafting with no angina 3.  Stage III chronic kidney disease Mild increase in creatinine.   4.  Doing well following recent revascularization  Recommendations:  Continue to watch creatinine.      Darden PalmerW. Spencer Lalita Ebel, Jr.  MD [facility]  08/18/2016, 8:42 AM

## 2016-08-18 NOTE — Progress Notes (Signed)
CM received call from [name] requesting help with medication. Cm met with Anthony Meadows and Anthony Meadows's wife and explained to family bc they have insurance, they do not qualify for MATCH asst.  CM explained this is the time to change their current situation with either a Medicare medication plan (part D) or get a medication supplement plan through a commercial insurance.  CM explained they can call their local [employer] [place] and get an appointment with a Navigator to secure a plan which will help defray cost of Anthony Meadows's chronic condition medication needs.  For today, family has been counseled to request generic medication prescription whenever possible and to use the [employer] $4 plan when possible.  No other CM needs were communicated.

## 2016-08-18 NOTE — Progress Notes (Signed)
Patient has been given discharge instructions medication list, and paper prescription, all questions were answered, I V and tele were dcd. Will discharge home as ordered . Zaelyn Noack, [name] Jessup RN

## 2016-08-18 NOTE — Progress Notes (Signed)
Subjective: Interval History: none.. Comfortable. Has been up walking.  Objective: Vital signs in last 24 hours: Temp:  [98.4 F (36.9 C)-99 F (37.2 C)] 98.4 F (36.9 C) (10/29 0540) Pulse Rate:  [62-75] 69 (10/29 0540) Resp:  [18-20] 20 (10/29 0540) BP: (99-123)/(45-76) 117/55 (10/29 0540) SpO2:  [92 %-95 %] 95 % (10/29 0540)  Intake/Output from previous day: 10/28 0701 - 10/29 0700 In: 440 [P.O.:240; IV Piggyback:200] Out: 750 [Urine:750] Intake/Output this shift: Total I/O In: 240 [P.O.:240] Out: -   Groin incisions healing nicely with 2+ femoral pulses bilaterally. 2+ popliteal pulses bilaterally. Feet well-perfused.  Lab Results:  Recent Labs  08/17/16 0411 08/18/16 0330  WBC 10.8* 12.7*  HGB 9.9* 10.2*  HCT 30.0* 31.7*  PLT 131* 126*   BMET  Recent Labs  08/17/16 0411 08/18/16 0330  NA 138 138  K 4.8 4.4  CL 108 103  CO2 26 28  GLUCOSE 94 115*  BUN 21* 20  CREATININE 1.91* 2.17*  CALCIUM 8.5* 9.4    Studies/Results: Ct Angio Ao+bifem W &/or Wo Contrast  Result Date: 08/15/2016 CLINICAL DATA:  Peripheral vascular disease EXAM: CT ANGIOGRAPHY OF ABDOMINAL AORTA WITH ILIOFEMORAL RUNOFF TECHNIQUE: Multidetector CT imaging of the abdomen, pelvis and lower extremities was performed using the standard protocol during bolus administration of intravenous contrast. Multiplanar CT image reconstructions and MIPs were obtained to evaluate the vascular anatomy. CONTRAST:  80 cc Isovue 370 COMPARISON:  04/10/2015 FINDINGS: VASCULAR Aorta: The right axillofemoral bypass graft is occluded. There is extensive irregular atherosclerotic plaque involving the abdominal aorta throughout its length. It is non aneurysmal. Celiac: Patent. SMA: Patent. Renals: Single bilateral renal arteries are patent. IMA: There is disease at the origin. The vessel is there after patent. The superior hemorrhoidal branch is hypertrophied providing some flow to the right lower extremity. Lower  Extremity Inflow: Right common and external iliac arteries are occluded. Right internal iliac artery trunk is occluded. Branches reconstitute. There is diffuse atherosclerotic disease of the left common and external iliac arteries without significant focal narrowing. Left internal iliac artery is patent. Outflow: Right common femoral artery is patent. The superficial femoral artery is occluded. A femoral to popliteal bypass graft is patent. There is layering of contrast in the graft with non-opacified blood laying above the contrast in a supine patient. This is similar to mixing artifact. Profunda femoral artery branches are patent. There is severe disease involving the tibial vessels without a single continuous vessel to the ankle. This is not significantly changed. Left common femoral artery is patent. Profunda femoral artery branches are patent. The superficial femoral artery is severely diseased then becomes occluded in the mid thigh. The popliteal artery reconstitutes just beyond the adductor canal. The popliteal artery is occluded below the knee. Peroneal and anterior tibial arteries reconstitute via collateral geniculate vessels. Runoff: See above. Lower Extremity Inflow: See above. Outflow: See above. Runoff: See above. Veins: The study was performed in arterial phase. Venous structures are non-opacified. Review of the MIP images confirms the above findings. NON-VASCULAR Lower chest: Small right pleural effusion is unchanged. Hepatobiliary: Visualized liver is unremarkable.  Small gallstones. Pancreas: Unremarkable Spleen: Unremarkable Adrenals/Urinary Tract: Left adrenal nodule is stable. Right adrenal gland is unremarkable. Several simple cysts in the kidneys are stable. A lateral cyst emanating from the left kidney is minimally complicated with peripheral posterior calcification. Stomach/Bowel: Diverticulosis in the ascending colon is unchanged. Normal appendix. No evidence of small-bowel obstruction.  Lymphatic: No obvious retroperitoneal adenopathy. Reproductive: Prostate and bladder  are unremarkable. Other: Small amount of free fluid layers in the upper right hemipelvis. Musculoskeletal: Exostosis emanating from the region of the proximal left tibia and fibula is unchanged. No vertebral compression deformity. IMPRESSION: VASCULAR Right axilla femoral bypass graft is occluded. Atherosclerotic changes within the aorta are not significantly changed. Right common and external iliac artery occlusion. Left common and external iliac artery are patent. Right femoral to popliteal artery below the knee bypass graft is patent. Severe runoff disease in the right calf is stable. Left superficial femoral artery occlusion. Left popliteal artery reconstitutes above the knee but is occluded below the knee. Two vessel runoff to the left ankle below the occlusion. This is stable. NON-VASCULAR Cholelithiasis. Electronically Signed   By: Jolaine ClickArthur  Hoss M.D.   On: 08/15/2016 08:04   Anti-infectives: Anti-infectives    Start     Dose/Rate Route Frequency Ordered Stop   08/17/16 1100  vancomycin (VANCOCIN) IVPB 1000 mg/200 mL premix     1,000 mg 200 mL/hr over 60 Minutes Intravenous Once 08/16/16 1715 08/17/16 1154   08/16/16 1715  vancomycin (VANCOCIN) IVPB 1000 mg/200 mL premix  Status:  Discontinued     1,000 mg 200 mL/hr over 60 Minutes Intravenous Every 12 hours 08/16/16 1709 08/16/16 1715   08/16/16 0600  vancomycin (VANCOCIN) IVPB 1000 mg/200 mL premix     1,000 mg 200 mL/hr over 60 Minutes Intravenous On call to O.R. 08/15/16 0939 08/16/16 1105      Assessment/Plan: s/p Procedure(s): THROMBECTOMY AXILLOFEMORAL BYPASS GRAFT (Right) BYPASS GRAFT FEMORAL-FEMORAL ARTERY USING 8MM X 30CM HEMASHIELD GRAFT (Bilateral) Stable for discharge to home. We'll continue on Zarontin. We'll see him back in several weeks   LOS: 4 days   Early, Todd 08/18/2016, 10:57 AM

## 2016-08-18 NOTE — Progress Notes (Signed)
CSW received c/s for medication assistance. CM c/s also ordered and they will see pt re: her medications.  No social work needs identified.  Please re-consult as necessary.  Pollyann SavoyJody Denetta Fei, LCSW Weekend Coverage [phone]

## 2016-08-19 ENCOUNTER — Encounter (HOSPITAL_COMMUNITY): Payer: Self-pay | Admitting: Vascular Surgery

## 2016-08-19 ENCOUNTER — Telehealth: Payer: Self-pay | Admitting: Vascular Surgery

## 2016-08-19 NOTE — Discharge Summary (Signed)
[facility] Discharge Summary  Anthony Meadows 1950-11-04 65 y.o. male  [id number]  Admission Date: 08/14/2016  Discharge Date: 08/18/2016  Physician: Gretta Began, MD  Admission Diagnosis: occluted rt fem bypass graft Occluded right axillofemoral bypass graft T82.858A  HPI:   This is a 65 y.o. male who previously had an axillary femoral bypass and right femoropopliteal elsewhere. He had thrombectomy of his right ax fem by Dr. Arbie Cookey June 2016. He subsequently had an additional thrombectomy of this September 2017. The patient noticed increasing pain and numbness and tingling in his right foot about 36-48 hours ago. He states similar symptoms occurred the last time his axillary femoral bypass occluded. He is able to walk on the leg. Motor function is intact as well as sensation. Patient is on Xarelto for atrial fibrillation, intake his last dose yesterday evening.  Hospital Course:  The patient was admitted to the hospital on 08/14/16 for IV heparin. His CTA abd/pelvis was repeated to look for different inflow options with plans for redo surgery on Friday 08/16/16. Cardiology was asked to review the patient for cardiac risk stratification.   The patient was felt to be high risk with >5% for cardiovascular complications, but was cleared for surgery.   His CT scan was reviewed revealing occlusion of his axillofemoral bypass. His right femoral to popliteal bypass was patent. He does have irregularity in his infrarenal aorta and left iliac system. Have recommended a left to right femorofemoral bypass and thrombectomy of his axillofemoral bypass.  He was and taken to the operating room on 08/16/2016 and underwent: #1 thrombectomy of right axillofemoral bypass, #2 left to right femorofemoral bypass  The patient tolerated the procedure well and was transported to the PACU in stable condition.   Post-operatively, the patient had some nausea that resolved by POD 1. His groin  incisions were intact without hematoma. He had a 2-3+ right popliteal pulse with excellent femorofemoral graft pulse. His feet were well perfused. He was transferred to the floor. His xarelto was resumed on POD 1. His renal function was stable.   By POD 2, the patient was ambulating comfortably. He was discharged home on POD 2 in good condition.   CBC    Component Value Date/Time   WBC 12.7 (H) 08/18/2016 0330   RBC 3.21 (L) 08/18/2016 0330   HGB 10.2 (L) 08/18/2016 0330   HCT 31.7 (L) 08/18/2016 0330   PLT 126 (L) 08/18/2016 0330   MCV 98.8 08/18/2016 0330   MCH 31.8 08/18/2016 0330   MCHC 32.2 08/18/2016 0330   RDW 12.5 08/18/2016 0330   LYMPHSABS 2.2 06/09/2016 1150   MONOABS 0.8 06/09/2016 1150   EOSABS 0.5 06/09/2016 1150   BASOSABS 0.0 06/09/2016 1150    BMET    Component Value Date/Time   NA 138 08/18/2016 0330   K 4.4 08/18/2016 0330   CL 103 08/18/2016 0330   CO2 28 08/18/2016 0330   GLUCOSE 115 (H) 08/18/2016 0330   BUN 20 08/18/2016 0330   CREATININE 2.17 (H) 08/18/2016 0330   CALCIUM 9.4 08/18/2016 0330   GFRNONAA 31 (L) 08/18/2016 0330   GFRAA 35 (L) 08/18/2016 0330     Discharge Instructions:   The patient is discharged to home with extensive instructions on wound care and progressive ambulation.  They are instructed not to drive or perform any heavy lifting until returning to see the physician in his office.  Discharge Instructions    Call MD for:  redness, tenderness, or signs of  infection (pain, swelling, bleeding, redness, odor or green/yellow discharge around incision site)    Complete by:  As directed    Call MD for:  severe or increased pain, loss or decreased feeling  in affected limb(s)    Complete by:  As directed    Call MD for:  temperature >100.5    Complete by:  As directed    Discharge wound care:    Complete by:  As directed    Wash the groin wounds with soap and water daily and pat dry. (No tub bath-only shower)  Then put a dry gauze or  washcloth there to keep this area dry daily and as needed.  Do not use Vaseline or neosporin on your incisions.  Only use soap and water on your incisions and then protect and keep dry.  Wash remaining wounds daily with soap and water and pat dry. Do not apply any creams or ointments on your incisions.   Driving Restrictions    Complete by:  As directed    No driving for 2 weeks   Increase activity slowly    Complete by:  As directed    Walk with assistance use walker or cane as needed   Lifting restrictions    Complete by:  As directed    No lifting for 2 weeks   Resume previous diet    Complete by:  As directed       Discharge Diagnosis:  occluted rt fem bypass graft Occluded right axillofemoral bypass graft T82.858A  Secondary Diagnosis: Patient Active Problem List   Diagnosis Date Noted  . Acute renal failure superimposed on stage 3 chronic kidney disease (HCC) 08/15/2016  . 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 Ischemia of lower extremity 06/21/2016  . CAD (coronary artery disease), native coronary artery 04/11/2015  . Benign essential HTN 04/11/2015  . Atrial fibrillation (HCC) 04/11/2015  . Chronic systolic heart failure (HCC) 04/11/2015  . Pre-op evaluation   . PVD (peripheral vascular disease) (HCC) 04/10/2015   Past Medical History:  Diagnosis Date  . AICD (automatic cardioverter/defibrillator) present    new device - 08/2015, [employer] DEVICE  . Anxiety   . Atrial fibrillation with rapid ventricular response (HCC)   . CHF (congestive heart failure) (HCC)   . Complication of anesthesia    "takes him awhile to come out of it" (08/14/2016)  . COPD (chronic obstructive pulmonary disease) (HCC)   . Coronary artery disease   . Dysrhythmia    a-fib, h/o cardioversion   . Hypercholesterolemia   . Hypertension   . Ischemic cardiomyopathy   . Myocardial infarction 2002   "before heart OR"  . On home oxygen therapy    "2L; only when I need it"  (08/14/2016)  . Pre-diabetes    "put him on RX then took him off earlier this year" (08/14/2016)  . PVD (peripheral vascular disease) (HCC)        Medication List    TAKE these medications   albuterol 108 (90 Base) MCG/ACT inhaler Commonly known as:  PROVENTIL HFA;VENTOLIN HFA Inhale 2 puffs into the lungs every 6 (six) hours as needed for wheezing or shortness of breath.   amiodarone 200 MG tablet Commonly known as:  PACERONE Take 100 mg by mouth daily.   atorvastatin 80 MG tablet Commonly known as:  LIPITOR Take 80 mg by mouth at bedtime.   carvedilol 25 MG tablet Commonly known as:  COREG Take 25 mg by mouth 2 (two) times daily  with a meal.   ENTRESTO 49-51 MG Generic drug:  sacubitril-valsartan Take 1 tablet by mouth 2 (two) times daily.   HYDROcodone-acetaminophen 5-325 MG tablet Commonly known as:  NORCO/VICODIN Take 1-2 tablets by mouth every 4 (four) hours as needed for moderate pain. What changed:  Another medication with the same name was removed. Continue taking this medication, and follow the directions you see here.   LORazepam 0.5 MG tablet Commonly known as:  ATIVAN Take 0.5 mg by mouth at bedtime.   Rivaroxaban 15 MG Tabs tablet Commonly known as:  XARELTO Take 1 tablet (15 mg total) by mouth daily with supper. What changed:  when to take this   torsemide 20 MG tablet Commonly known as:  DEMADEX Take 20 mg by mouth daily.   Vitamin D3 10000 units Tabs Take 1 tablet by mouth daily after breakfast.       Percocet #30 No Refill  Disposition: Home  Patient's condition: is Good  Follow up: 1. Dr. Arbie CookeyEarly in 2 weeks   Maris BergerKimberly Larrisha Babineau, PA-C [facility] [phone] 08/19/2016  2:33 PM  - For VQI Registry use --- Instructions: Press F2 to tab through selections.  Delete question if not applicable.   Post-op:  Wound infection: No  Graft infection: No  Transfusion: No  New Arrhythmia: No Ipsilateral amputation: No, [  ] Minor, [ ]  BKA, [ ]  AKA Discharge patency: [x ] Primary, [ ]  Primary assisted, [ ]  Secondary, [x ] Occluded ax-fem Patency judged by: [x ] Dopper only, [ ]  Palpable graft pulse, [ ]  Palpable distal pulse, [ ]  ABI inc. > 0.15, [ ]  Duplex D/C Ambulatory Status: Ambulatory  Complications: MI: No, [ ]  Troponin only, [ ]  EKG or Clinical CHF: No Resp failure:No, [ ]  Pneumonia, [ ]  Ventilator Chg in renal function: Yes, [ ]  Inc. Cr > 0.5, [ ]  Temp. Dialysis, [ ]  Permanent dialysis Stroke: No, [ ]  Minor, [ ]  Major Return to OR: No  Reason for return to OR: [ ]  Bleeding, [ ]  Infection, [ ]  Thrombosis, [ ]  Revision  Discharge medications: Statin use:  yes ASA use:  yes Plavix use:  no Beta blocker use: yes Coumadin use: no, on Xarelto

## 2016-08-19 NOTE — Telephone Encounter (Signed)
-----   Message from Sharee PimpleMarilyn K McChesney, RN sent at 08/18/2016  9:04 PM EDT ----- Regarding: 2 weeks with TFE   ----- Message ----- From: Raymond GurneyKimberly A Trinh, PA-C Sent: 08/16/2016   7:05 PM To: Vvs Charge Pool  S/p thrombectomy right ax-fem, fem-fem bypass 08/16/16  F/u with Dr. Arbie CookeyEarly in 2 weeks  Thanks Selena BattenKim

## 2016-08-19 NOTE — Anesthesia Postprocedure Evaluation (Signed)
Anesthesia Post Note  Patient: Merl Merkel  Procedure(s) Performed: ProcedureSelect Specialty Hospital - North KnoxvillWyliPleAdolphusKentuckWellspan EpGastrointestinal Specialists Of ClarksvProvidence Regional Medical Center Everett/Pacific CampuWyliPleAdolphusKentuckSt Joseph Mercy HospitaWyliPleAdolphusKentuckRenue Surgery CenteChristian Hospital NorthwesWyliPleAdolphusKentuckBolivar Medica8Ar37Aggie CoChVernon Mem HsptWyliPleAdolpUpmc Susquehanna MuncWyliPleAdLake Cumberland Regional HospitaWyliPleAdolphusKentMichael E. Debakey Va Medical CenteWyliPleAdolphusKentuckMontefiore Med Center - Jack D Weiler Hosp Of A EinsteinUnion General HospitaWyliPleAdolphusKentuckUpmc Susquehanna Soldiers &5ArDonBTufts Medical CenteWyliPleAdolphusKentuckSBerstein Hilliker Hartzell [facility] LLP Dba The Surgery [facility] Central PWyliPleSt. Vincent MorriltoWyliPlBone And Joint Surgery Center Of NovWyliPleAdolphusKentuckKessler Institute For Rehabilitation IncorpoMunson Medical CenteWyliPleAdolphusKentuckMountainsBanner Health Mountain Vista Surgery CenteWyliPleAdolphusKentuBuckhead Ambulatory Surgical CenteWyliPleAdolphusMarian Regional Medical Center, Arroyo GrandWyliPleAdolphusKentCommunity HospitaWyliPleAdolphusKentuckRegencyBaylor Specialty HospitaWyliPleAVanderbilt University HospitaWyliPBaylor Scott & White Mclane Children'S Medical CenteWyliPleAdolpPromise Hospital Of Louisiana-Bossier City CampuWyliPleAdolphusKentuckHenrico Doctors'East Alabama Medical CenteWyliPleAdolphusKentuckCapitolaBaptist Hospitals Of Southeast Texas Fannin Behavioral CenteWyliPleAdolphusKentuckShWesterly HospitaWyliPleAdolphusGlendive Medical CenteWyliPleAdolphusKenHermann Drive Surgical Hospital LWyliPleChina Lake Surgery Center LLWyliPleAdolMadison Physician Surgery Center LLWyliPleAdolphusKentuCottonwood SprinLake Huron Medical CenteWTruman Medical Center - Hospital Hill 2 CenteWyliPleAurelia Osborn Fox Memorial Hospital Tri Town Regional HealthcarWyliPleAdolphusKentuckScripps Mercy [id number] Regional Medical CenteWyliPleAdolphusKentuckSunset Surgical CeArDonAtlanta Surgery NortWyliPleAdolphusKentuckChi HealNorthern Ec LLWyliPleAdolphusKentuckWesLake Murray Endoscopy CenteWyliPleAdolphusKeDesoto Regional Health SysteWyliPleAdolphusKentuckPBakersfield Specialists Surgical Center LLWyliPleAdolphusKentuOasis Surgery Center LWyliPleAdolphusKentuckKaisWhite County Medical Center - North CampuWyliPleAdolphusKentuckSouth Spring Valley Hospital Medical CenteWyliPleAdolphusKentuckDigestive Disease Special4ArDonL64Aggie CoChNoland Hospital BirminghaWylSummersville Regional Medical CenteWyliPleLaser And Outpatient Surgery CenteWyliPleAdolphusKentuckStCataract And Laser Center West LLWyliPleAdolphusKentuckEphraim Mcdowell Fort Logan HospitaWyliPMorton Hospital And Medical CenteWyliPlMercy HospCorpus Christi Endoscopy Center LLWyliPleAdolphusKentuckHaskell CoBournewood HospitaWyliPleAdolphusKentuckKate Dishman RehabilitaBlack River Ambulatory Surgery CenteWyliPleAdoCenter For Eye Surgery LLWyliPleAdolphusKentuckWestchester Crestwood Solano Psychiatric Health FacilitWyliPleAdolphusKentuckKent County Memorial 3ArD75Aggie CoChild psychot84 Woo960LoCelene KrLTexasJohnny Bridgeon CodeetihValle de A24Aggie CoChild [id number] KrLTexasJohnny Br<MEASUR G>eral Level of consciousness: awake and alert Pain management: pain level controlled Vital Signs Assessment: post-procedure vital signs reviewed and stable Respiratory status: spontaneous breathing, nonlabored ventilation, respiratory function stable and patient connected to nasal cannula oxygen Cardiovascular status: blood pressure returned to baseline and stable Postop Assessment: no signs of nausea or vomiting Anesthetic complications: no    Last Vitals:  Vitals:   08/17/16 2012 08/18/16 0540  BP: (!) 99/45 (!) 117/55  Pulse: 62 69  Resp: 18 20  Temp: 37.2 C 36.9 C    Last Pain:  Vitals:   08/18/16 0830  TempSrc:   PainSc: 0-No pain                 Eryn Marandola,JAMES TERRILL

## 2016-08-19 NOTE — Telephone Encounter (Signed)
Spoke to pts spouse for appt mailing letter for f/u 09/10/16 beg

## 2016-09-04 ENCOUNTER — Encounter: Payer: Self-pay | Admitting: Vascular Surgery

## 2016-09-10 ENCOUNTER — Ambulatory Visit (INDEPENDENT_AMBULATORY_CARE_PROVIDER_SITE_OTHER): Payer: Medicare Other | Admitting: Vascular Surgery

## 2016-09-10 ENCOUNTER — Encounter: Payer: Self-pay | Admitting: Vascular Surgery

## 2016-09-10 VITALS — BP 122/56 | HR 65 | Temp 97.2°F | Resp 18 | Ht 68.0 in | Wt 158.5 lb

## 2016-09-10 DIAGNOSIS — I739 Peripheral vascular disease, unspecified: Secondary | ICD-10-CM

## 2016-09-10 NOTE — Progress Notes (Signed)
   Patient name: Anthony Meadows MRN: [id number] DOB: 1950-10-23 Sex: male  REASON FOR VISIT: All up recent redo of axillofemoral and femorofemoral bypass  HPI: Anthony Meadows is a 65 y.o. male here today for follow-up. Had extensive past vascular surgery [place]. Had presented on several occasions with an occluded right axillofemoral bypass with thrombectomy and revision of these. He presented in October with recurrent occlusion. He underwent thrombectomy was right axillary to femoral bypass and also underwent a left right femorofemoral bypass. He did well the hospital and was discharged to home. He looks quite good today. He reports that he is walking without difficulty. He does have some night cramps in his feet but is having no claudication type symptoms. He has had no wound problems.  Current Outpatient Prescriptions  Medication Sig Dispense Refill  . albuterol (PROVENTIL HFA;VENTOLIN HFA) 108 (90 Base) MCG/ACT inhaler Inhale 2 puffs into the lungs every 6 (six) hours as needed for wheezing or shortness of breath.     Marland Kitchen. amiodarone (PACERONE) 200 MG tablet Take 100 mg by mouth daily.    Marland Kitchen. atorvastatin (LIPITOR) 80 MG tablet Take 80 mg by mouth at bedtime.     . carvedilol (COREG) 25 MG tablet Take 25 mg by mouth 2 (two) times daily with a meal.    . Cholecalciferol (VITAMIN D3) 10000 units TABS Take 1 tablet by mouth daily after breakfast.    . HYDROcodone-acetaminophen (NORCO/VICODIN) 5-325 MG tablet Take 1-2 tablets by mouth every 4 (four) hours as needed for moderate pain. 30 tablet 0  . LORazepam (ATIVAN) 0.5 MG tablet Take 0.5 mg by mouth at bedtime.     . Rivaroxaban (XARELTO) 15 MG TABS tablet Take 1 tablet (15 mg total) by mouth daily with supper. 42 tablet   . sacubitril-valsartan (ENTRESTO) 49-51 MG Take 1 tablet by mouth 2 (two) times daily.    Marland Kitchen. torsemide (DEMADEX) 20 MG tablet Take 20 mg by mouth daily.      No current  facility-administered medications for this visit.      PHYSICAL EXAM: Vitals:   09/10/16 1354  BP: (!) 122/56  Pulse: 65  Resp: 18  Temp: 97.2 F (36.2 C)  TempSrc: Oral  SpO2: 98%  Weight: 158 lb 8 oz (71.9 kg)  Height: 5\' 8"  (1.727 m)    GENERAL: The patient is a well-nourished male, in no acute distress. The vital signs are documented above. Left groin incisions are well-healed. He has a easily palpable femorofemoral bypass and 2+ femoral pulses bilaterally. He has a 2+ right popliteal pulse. I do not palpate pedal pulses noted that his feet are warm and well perfused.  MEDICAL ISSUES: Stable overall. Has had recurrent occlusion of his axillofemoral bypass. Hopefully the addition of a femoral to femoral bypass will improve his patency. In the past he has been deemed not an operative candidate for open aortic repair due to severe cardiac disease. I did explain if he continues to have recurrent acute occlusions of his extra-anatomic bypass that we would have him reevaluated with cardiology to determine his risk we will see him again in 3 months   Larina Earthlyodd F. Quindarrius Joplin, MD [facility] HospitalFACS [facility] Office Tel [phone] Pager [phone]

## 2016-09-11 NOTE — Addendum Note (Signed)
Addended by: Burton ApleyPETTY, Mettie Roylance A on: 09/11/2016 02:34 PM   Modules accepted: Orders

## 2016-10-08 ENCOUNTER — Encounter (HOSPITAL_COMMUNITY): Payer: Medicare Other

## 2016-10-08 ENCOUNTER — Ambulatory Visit: Payer: Medicare Other | Admitting: Vascular Surgery

## 2016-10-11 ENCOUNTER — Encounter: Payer: Self-pay | Admitting: Vascular Surgery

## 2016-10-29 ENCOUNTER — Ambulatory Visit (HOSPITAL_COMMUNITY)
Admission: RE | Admit: 2016-10-29 | Discharge: 2016-10-29 | Disposition: A | Discharge status: Home / Self Care | Payer: Medicare Other | Source: Ambulatory Visit | Attending: Vascular Surgery | Admitting: Vascular Surgery

## 2016-10-29 ENCOUNTER — Encounter: Payer: Self-pay | Admitting: Family

## 2016-10-29 ENCOUNTER — Ambulatory Visit (INDEPENDENT_AMBULATORY_CARE_PROVIDER_SITE_OTHER): Payer: Medicare Other | Admitting: Family

## 2016-10-29 VITALS — BP 99/58 | HR 52 | Temp 97.3°F | Resp 16 | Ht 68.0 in | Wt 158.0 lb

## 2016-10-29 DIAGNOSIS — Z87891 Personal history of nicotine dependence: Secondary | ICD-10-CM

## 2016-10-29 DIAGNOSIS — I779 Disorder of arteries and arterioles, unspecified: Secondary | ICD-10-CM

## 2016-10-29 DIAGNOSIS — I739 Peripheral vascular disease, unspecified: Secondary | ICD-10-CM

## 2016-10-29 DIAGNOSIS — Z95828 Presence of other vascular implants and grafts: Secondary | ICD-10-CM

## 2016-10-29 NOTE — Patient Instructions (Signed)

## 2016-10-29 NOTE — Progress Notes (Addendum)
Postoperative Visit   History of Present Illness  Anthony Meadows is a 66 y.o. year old male who presents for postoperative follow-up for:  He underwent thrombectomy of right axillary to femoral bypass and also underwent a left to right femorofemoral bypass (Date: 08-16-16). He did well the hospital and was discharged to home.  He is also s/p thrombectomy of right axillofemoral bypass with exploration of right femoral artery and Dacron patch angioplasty of right femoral anastomosis on 04-12-15 by Dr. Arbie Cookey. He had extensive past vascular surgery treatment in [place]. Had presented on several occasions with an occluded right axillofemoral bypass with thrombectomy and revision of these. He presented in October 2017 with recurrent occlusion.  He reports that he is walking without difficulty. He does have some night cramps in his calves but is having no claudication type symptoms. He has had no wound problems.   The patient is able to complete their activities of daily living.    Dr. Arbie Cookey last evaluated pt on 09-10-16. At that time left groin incisions were well-healed. He had an easily palpable femorofemoral bypass and 2+ femoral pulses bilaterally. He had a 2+ right popliteal pulse. Dr. Arbie Cookey did not palpate pedal pulses, but noted that his feet were warm and well perfused. Stable overall. Has had recurrent occlusion of his axillofemoral bypass. Hopefully the addition of a femoral to femoral bypass will improve his patency. In the past he had been deemed not an operative candidate for open aortic repair due to severe cardiac disease. Dr. Arbie Cookey explained if he continues to have recurrent acute occlusions of his extra-anatomic bypass that we would have him reevaluated with cardiology to determine his risk.  Pt reports that he has to limit his fluid intake to 1 liter/day due to CHF, states hx of MI, had a 5 vessel CABG in 2002, has a pacemaker/ICD. Dr. Earna Coder is his  cardiologist in  [place], electrophysiologist is in [place].   He takes Entresto for CHF, a beta blocker, statin, and Xarelto.    Pt returns for 3 months follow up, still within the 90 days global postoperative period.   Last A1C was 5.7 on 06-18-16. He states his last tobacco use was 08-03-16, started smoking at age 34  For VQI Use Only  PRE-ADM LIVING: Home  AMB STATUS: Ambulatory   Active Ambulatory Problems    Diagnosis Date Noted  . PVD (peripheral vascular disease) (HCC) 04/10/2015  . CAD (coronary artery disease), native coronary artery 04/11/2015  . Benign essential HTN 04/11/2015  . Atrial fibrillation (HCC) 04/11/2015  . Chronic systolic heart failure (HCC) 04/11/2015  . Pre-op evaluation   . Ischemia of lower extremity 06/21/2016  . 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 Acute renal failure superimposed on stage 3 chronic kidney disease (HCC) 08/15/2016   Resolved Ambulatory Problems    Diagnosis Date Noted  . No Resolved Ambulatory Problems   Past Medical History:  Diagnosis Date  . AICD (automatic cardioverter/defibrillator) present   . Anxiety   . Atrial fibrillation with rapid ventricular response (HCC)   . CHF (congestive heart failure) (HCC)   . Complication of anesthesia   . COPD (chronic obstructive pulmonary disease) (HCC)   . Coronary artery disease   . Dysrhythmia   . Hypercholesterolemia   . Hypertension   . Ischemic cardiomyopathy   . Myocardial infarction 2002  . On home oxygen therapy   . Pre-diabetes   . PVD (peripheral vascular disease) (HCC)  Active Ambulatory Problems    Diagnosis Date Noted  . PVD (peripheral vascular disease) (HCC) 04/10/2015  . CAD (coronary artery disease), native coronary artery 04/11/2015  . Benign essential HTN 04/11/2015  . Atrial fibrillation (HCC) 04/11/2015  . Chronic systolic heart failure (HCC) 04/11/2015  . Pre-op evaluation   . Ischemia of lower extremity 06/21/2016  . Thrombosis of arterial graft (HCC)  08/14/2016  . Acute renal failure superimposed on stage 3 chronic kidney disease (HCC) 08/15/2016   Resolved Ambulatory Problems    Diagnosis Date Noted  . No Resolved Ambulatory Problems   Past Medical History:  Diagnosis Date  . AICD (automatic cardioverter/defibrillator) present   . Anxiety   . Atrial fibrillation with rapid ventricular response (HCC)   . CHF (congestive heart failure) (HCC)   . Complication of anesthesia   . COPD (chronic obstructive pulmonary disease) (HCC)   . Coronary artery disease   . Dysrhythmia   . Hypercholesterolemia   . Hypertension   . Ischemic cardiomyopathy   . Myocardial infarction 2002  . On home oxygen therapy   . Pre-diabetes   . PVD (peripheral vascular disease) (HCC)    Past Medical History:  Diagnosis Date  . AICD (automatic cardioverter/defibrillator) present    new device - 08/2015, [employer] DEVICE  . Anxiety   . Atrial fibrillation with rapid ventricular response (HCC)   . CHF (congestive heart failure) (HCC)   . Complication of anesthesia    "takes him awhile to come out of it" (08/14/2016)  . COPD (chronic obstructive pulmonary disease) (HCC)   . Coronary artery disease   . Dysrhythmia    a-fib, h/o cardioversion   . Hypercholesterolemia   . Hypertension   . Ischemic cardiomyopathy   . Myocardial infarction 2002   "before heart OR"  . On home oxygen therapy    "2L; only when I need it" (08/14/2016)  . Pre-diabetes    "put him on RX then took him off earlier this year" (08/14/2016)  . PVD (peripheral vascular disease) (HCC)      Past Surgical History:  Procedure Laterality Date  . ANGIOPLASTY / STENTING FEMORAL Right ~2012  . CARDIAC CATHETERIZATION  2002  . CARDIAC DEFIBRILLATOR PLACEMENT  01/2008   Medtronic ; Virtuoso VR defibrillator; Model D154VWC; Serial [id number]  . CORONARY ANGIOPLASTY    . CORONARY ARTERY BYPASS GRAFT  2002   'CABG X5"  . FEMORAL-FEMORAL BYPASS GRAFT Bilateral 08/16/2016   Procedure:  BYPASS GRAFT FEMORAL-FEMORAL ARTERY USING X 30CM HEMASHIELD GRAFT;  Surgeon: Larina Earthly, MD;  Location: [facility] OR;  Service: Vascular;  Laterality: Bilateral;  . PATCH ANGIOPLASTY Right 04/12/2015   Procedure: PATCH ANGIOPLASTY Right Femoral Artery;  Surgeon: Larina Earthly, MD;  Location: [facility] OR;  Service: Vascular;  Laterality: Right;  . THROMBECTOMY FEMORAL ARTERY Right 04/12/2015   Procedure: THROMBECTOMY Right Axilla-Femoral Gortex Graft;  Surgeon: Larina Earthly, MD;  Location: [facility] OR;  Service: Vascular;  Laterality: Right;  . THROMBECTOMY FEMORAL ARTERY Right 06/21/2016   Procedure: RIGHT AXILLARY-FEMORAL THROMBECTOMY;  Surgeon: Larina Earthly, MD;  Location: [facility] OR;  Service: Vascular;  Laterality: Right;  . THROMBECTOMY FEMORAL ARTERY Right 08/16/2016   Procedure: THROMBECTOMY AXILLOFEMORAL BYPASS GRAFT;  Surgeon: Larina Earthly, MD;  Location: [facility] OR;  Service: Vascular;  Laterality: Right;     Current Outpatient Prescriptions on File Prior to Visit  Medication Sig Dispense Refill  . albuterol (PROVENTIL HFA;VENTOLIN HFA) 108 (90 Base) MCG/ACT inhaler Inhale 2 puffs into  the lungs every 6 (six) hours as needed for wheezing or shortness of breath.     Marland Kitchen. amiodarone (PACERONE) 200 MG tablet Take 100 mg by mouth daily.    Marland Kitchen. atorvastatin (LIPITOR) 80 MG tablet Take 80 mg by mouth at bedtime.     . carvedilol (COREG) 25 MG tablet Take 25 mg by mouth 2 (two) times daily with a meal.    . Cholecalciferol (VITAMIN D3) 10000 units TABS Take 1 tablet by mouth daily after breakfast.    . HYDROcodone-acetaminophen (NORCO/VICODIN) 5-325 MG tablet Take 1-2 tablets by mouth every 4 (four) hours as needed for moderate pain. 30 tablet 0  . LORazepam (ATIVAN) 0.5 MG tablet Take 0.5 mg by mouth at bedtime.     . Rivaroxaban (XARELTO) 15 MG TABS tablet Take 1 tablet (15 mg total) by mouth daily with supper. 42 tablet   . sacubitril-valsartan (ENTRESTO) 49-51 MG Take 1 tablet by mouth 2 (two) times daily.    Marland Kitchen.  torsemide (DEMADEX) 20 MG tablet Take 20 mg by mouth daily.      No current facility-administered medications on file prior to visit.    Social History   Social History  . Marital status: Married    Spouse name: N/A  . Number of children: N/A  . Years of education: N/A   Occupational History  . Not on file.   Social History Main Topics  . Smoking status: Former Smoker    Packs/day: 3.00    Years: 52.00    Types: Cigarettes    Quit date: 08/04/2016  . Smokeless tobacco: Never Used  . Alcohol use No  . Drug use: No  . Sexual activity: Not Currently   Other Topics Concern  . Not on file   Social History Narrative  . No narrative on file     Physical Examination  Vitals:   10/29/16 1134 10/29/16 1136  BP: 102/63 (!) 99/58  Pulse: (!) 52   Resp: 16   Temp: 97.3 F (36.3 C)   TempSrc: Oral   SpO2: 98%   Weight: 158 lb (71.7 kg)   Height: 5\' 8"  (1.727 m)    Body mass index is 24.02 kg/m.   Right ax-fem bypass with faintly palpable pulse. Fem-fem bypass graft pulse is faintly palpable. Pedal pulses are not palpable but feet and toes are pink and warm with brisk capillary refill.   Left groin: Incisions are well healed, no signs of ischemia in feet or legs.  ABI's (10-29-16): Right: 0.79 (PT), 0.73 (DP) (0.47 on 06-13-16, pre-op), waveforms appear to be bi or monophasic, TBI: 0.53 (was absent on 06-13-16) Left: 0.55 (DP), waveform appears monophasic, PT is absent (was 0.67 on 06-13-16), TBI: 0.41 (was 0.57 on 06-13-16)  Medical Decision Making  Anthony Meadows is a 66 y.o. year old male who presents s/p thrombectomy of right axillary to femoral bypass and  left to right femorofemoral bypass (Date: 08-16-16). He did well the hospital and was discharged to home.  He is also s/p thrombectomy of right axillofemoral bypass with exploration of right femoral artery and Dacron patch angioplasty of right femoral anastomosis on 04-12-15 by Dr. Arbie CookeyEarly. He had extensive past vascular  surgery treatment in [place].  Right ABI improved since pre-op ABI; Left ABI remains stable at 0.55 (DP), PT is absent, was present on 06-13-16 at 0.67 ABI.    The patient's left groin incision has healing appropriately with resolution of pre-operative symptoms. I discussed in depth with the  patient the nature of atherosclerosis, and emphasized the importance of maximal medical management including strict control of blood pressure, blood glucose, and lipid levels, obtaining regular exercise, and cessation of smoking.  The patient is aware that without maximal medical management the underlying atherosclerotic disease process will progress, limiting the benefit of any interventions. The patient's surveillance will include ABI and fem-fem bypass duplex studies which will be completed in: 3 months, at which time the patient will be re-evaluated.  I discussed follow up plan with Dr. Arbie Cookey who agreed.  I emphasized the importance of routine surveillance of the patient's bypass, as the vascular surgery literature emphasize the improved patency possible with assisted primary patency procedures versus secondary patency procedures. The patient agrees to participate in their maximal medical care and routine surveillance.  Thank you for allowing [place] to participate in this patient's care.  NICKEL, Carma Lair, RN, MSN, FNP-C [facility] [place] Office: [phone]  10/29/2016, 11:46 AM  Clinic MD: Early

## 2016-12-10 ENCOUNTER — Ambulatory Visit: Payer: Medicare Other | Admitting: Vascular Surgery

## 2016-12-10 ENCOUNTER — Encounter (HOSPITAL_COMMUNITY): Payer: Medicare Other

## 2017-01-15 ENCOUNTER — Encounter: Payer: Self-pay | Admitting: Family

## 2017-01-28 ENCOUNTER — Encounter: Payer: Self-pay | Admitting: Family

## 2017-01-28 ENCOUNTER — Ambulatory Visit (INDEPENDENT_AMBULATORY_CARE_PROVIDER_SITE_OTHER)
Admission: RE | Admit: 2017-01-28 | Discharge: 2017-01-28 | Disposition: A | Discharge status: Home / Self Care | Payer: Medicare Other | Source: Ambulatory Visit | Attending: Family | Admitting: Family

## 2017-01-28 ENCOUNTER — Ambulatory Visit (INDEPENDENT_AMBULATORY_CARE_PROVIDER_SITE_OTHER): Payer: Medicare Other | Admitting: Family

## 2017-01-28 ENCOUNTER — Ambulatory Visit (HOSPITAL_COMMUNITY)
Admission: RE | Admit: 2017-01-28 | Discharge: 2017-01-28 | Disposition: A | Discharge status: Home / Self Care | Payer: Medicare Other | Source: Ambulatory Visit | Attending: Family | Admitting: Family

## 2017-01-28 VITALS — BP 122/53 | HR 55 | Temp 97.3°F | Resp 18 | Ht 67.0 in | Wt 163.0 lb

## 2017-01-28 DIAGNOSIS — Z87891 Personal history of nicotine dependence: Secondary | ICD-10-CM

## 2017-01-28 DIAGNOSIS — Z95828 Presence of other vascular implants and grafts: Secondary | ICD-10-CM | POA: Diagnosis not present

## 2017-01-28 DIAGNOSIS — I779 Disorder of arteries and arterioles, unspecified: Secondary | ICD-10-CM | POA: Diagnosis not present

## 2017-01-28 NOTE — Patient Instructions (Signed)

## 2017-01-28 NOTE — Progress Notes (Signed)
[facility] [place]   CC: Follow up peripheral artery occlusive disease  History of Present Illness Anthony Meadows is a 66 y.o. male who is s/p thrombectomy of right axillary to femoral bypass and also underwent a left to right femorofemoral bypass (Date: 08-16-16) by Dr. Arbie Cookey. He did well the hospital and was discharged to home.  He is also s/p thrombectomy of right axillofemoral bypass with exploration of right femoral artery and Dacron patch angioplasty of right femoral anastomosis on 04-12-15 by Dr. Arbie Cookey. He had extensive past vascular surgery treatment in [place]. Had presented on several occasions with an occluded right axillofemoral bypass with thrombectomy and revision of these. He presented in October 2017 with recurrent occlusion.  He returns today for routine follow up.  He has right calf claudication after walking about 200 yards, relieved by rest. He fell about January 2018, and has had intermittent bilateral low back pain since then.  He has "stayed tired" for about a year (since about April, 2017). Wife and pt deny that he snores.   The patient is able to complete his activities of daily living.    Dr. Arbie Cookey last evaluated pt on 09-10-16. At that time left groin incisions were well-healed. He had an easily palpable femorofemoral bypass and 2+ femoral pulses bilaterally. He had a 2+ right popliteal pulse. Dr. Arbie Cookey did not palpate pedal pulses, but noted that his feet were warm and well perfused. Stable overall. Has had recurrent occlusion of his axillofemoral bypass. Hopefully the addition of a femoral to femoral bypass will improve his patency. In the past he had been deemed not an operative candidate for open aortic repair due to severe cardiac disease. Dr. Arbie Cookey explained if he continues to have recurrent acute occlusions of his extra-anatomic bypass that we would have him reevaluated with cardiology to determine his risk.  Pt reports that he  has to limit his fluid intake to 1 liter/day due to CHF, states hx of MI, had a 5 vessel CABG in 2002, has a pacemaker/ICD. Dr. Earna Coder is his  cardiologist in [place], electrophysiologist is in [place].   He takes Entresto for CHF  Pt Diabetic: No, Last A1C was 5.7 on 06-18-16.  Pt smoker: former smoker, quit 08-04-16, started smoking at age 29  Pt meds include: Statin :Yes Betablocker: Yes ASA: No Other anticoagulants/antiplatelets: Xarelto  Past Medical History:  Diagnosis Date  . AICD (automatic cardioverter/defibrillator) present    new device - 08/2015, [employer] DEVICE  . Anxiety   . Atrial fibrillation with rapid ventricular response (HCC)   . CHF (congestive heart failure) (HCC)   . Complication of anesthesia    "takes him awhile to come out of it" (08/14/2016)  . COPD (chronic obstructive pulmonary disease) (HCC)   . Coronary artery disease   . Dysrhythmia    a-fib, h/o cardioversion   . Hypercholesterolemia   . Hypertension   . Ischemic cardiomyopathy   . Myocardial infarction 2002   "before heart OR"  . On home oxygen therapy    "2L; only when I need it" (08/14/2016)  . Pre-diabetes    "put him on RX then took him off earlier this year" (08/14/2016)  . PVD (peripheral vascular disease) (HCC)     Social History Social History  Substance Use Topics  . Smoking status: Former Smoker    Packs/day: 3.00    Years: 52.00    Types: Cigarettes    Quit date: 08/04/2016  . Smokeless tobacco: Never Used  .  Alcohol use No    Family History No family history on file.  Past Surgical History:  Procedure Laterality Date  . ANGIOPLASTY / STENTING FEMORAL Right ~2012  . CARDIAC CATHETERIZATION  2002  . CARDIAC DEFIBRILLATOR PLACEMENT  01/2008   Medtronic ; Virtuoso VR defibrillator; Model D154VWC; Serial [id number]  . CORONARY ANGIOPLASTY    . CORONARY ARTERY BYPASS GRAFT  2002   'CABG X5"  . FEMORAL-FEMORAL BYPASS GRAFT Bilateral 08/16/2016   Procedure: BYPASS  GRAFT FEMORAL-FEMORAL ARTERY USING X 30CM HEMASHIELD GRAFT;  Surgeon: Larina Earthly, MD;  Location: [facility] OR;  Service: Vascular;  Laterality: Bilateral;  . PATCH ANGIOPLASTY Right 04/12/2015   Procedure: PATCH ANGIOPLASTY Right Femoral Artery;  Surgeon: Larina Earthly, MD;  Location: [facility] OR;  Service: Vascular;  Laterality: Right;  . THROMBECTOMY FEMORAL ARTERY Right 04/12/2015   Procedure: THROMBECTOMY Right Axilla-Femoral Gortex Graft;  Surgeon: Larina Earthly, MD;  Location: [facility] OR;  Service: Vascular;  Laterality: Right;  . THROMBECTOMY FEMORAL ARTERY Right 06/21/2016   Procedure: RIGHT AXILLARY-FEMORAL THROMBECTOMY;  Surgeon: Larina Earthly, MD;  Location: [facility] OR;  Service: Vascular;  Laterality: Right;  . THROMBECTOMY FEMORAL ARTERY Right 08/16/2016   Procedure: THROMBECTOMY AXILLOFEMORAL BYPASS GRAFT;  Surgeon: Larina Earthly, MD;  Location: [facility] OR;  Service: Vascular;  Laterality: Right;    Allergies  Allergen Reactions  . Metformin And Related Diarrhea and Nausea And Vomiting  . Penicillins Swelling    SWELLING REACTION UNSPECIFIED  Has patient had a PCN reaction causing immediate rash, facial/tongue/throat swelling, SOB or lightheadedness with hypotension: Yes Has patient had a PCN reaction causing severe rash involving mucus membranes or skin necrosis: No Has patient had a PCN reaction that required hospitalization No Has patient had a PCN reaction occurring within the last 10 years: No If all of the above answers are "NO", then may proceed with Cephalosporin use.     Current Outpatient Prescriptions  Medication Sig Dispense Refill  . albuterol (PROVENTIL HFA;VENTOLIN HFA) 108 (90 Base) MCG/ACT inhaler Inhale 2 puffs into the lungs every 6 (six) hours as needed for wheezing or shortness of breath.     Marland Kitchen amiodarone (PACERONE) 200 MG tablet Take 100 mg by mouth daily.    Marland Kitchen atorvastatin (LIPITOR) 80 MG tablet Take 80 mg by mouth at bedtime.     . carvedilol (COREG) 25 MG tablet Take 25 mg by  mouth 2 (two) times daily with a meal.    . Cholecalciferol (VITAMIN D3) 10000 units TABS Take 1 tablet by mouth daily after breakfast.    . LORazepam (ATIVAN) 0.5 MG tablet Take 0.5 mg by mouth at bedtime.     . Rivaroxaban (XARELTO) 15 MG TABS tablet Take 1 tablet (15 mg total) by mouth daily with supper. 42 tablet   . sacubitril-valsartan (ENTRESTO) 49-51 MG Take 1 tablet by mouth 2 (two) times daily.    Marland Kitchen torsemide (DEMADEX) 20 MG tablet Take 20 mg by mouth daily.     Marland Kitchen HYDROcodone-acetaminophen (NORCO/VICODIN) 5-325 MG tablet Take 1-2 tablets by mouth every 4 (four) hours as needed for moderate pain. (Patient not taking: Reported on 01/28/2017) 30 tablet 0   No current facility-administered medications for this visit.     ROS: See HPI for pertinent positives and negatives.   Physical Examination  Vitals:   01/28/17 1241  BP: (!) 122/53  Pulse: (!) 55  Resp: 18  Temp: 97.3 F (36.3 C)  SpO2: 99%  Weight: 163 lb (73.9  kg)  Height:  (1.702 m)   Body mass index is 25.53 kg/m.  General: A&O x 3, WDWN, male. Gait: normal Eyes: PERRLA. Pulmonary: Respirations are non labored, fair air movement, few crackles in bases, no rhonchi or wheezes. Cardiac: regular rhythm, no detected murmur.  Pacemaker/AICD palpated left upper chest.       Carotid Bruits Right Left   Negative Negative  Aorta is not palpable. Radial pulses: 2+ palpable bilaterally                           VASCULAR EXAM: Extremities without ischemic changes, without Gangrene; without open wounds.  Femoral to femoral bypass graft ans right axillary to femoral bypass graft do not have palpable pulses, but both have strong Doppler signals.                                                                                                           LE Pulses Right Left       FEMORAL  2+ palpable  2+ palpable        POPLITEAL  not palpable   not palpable       POSTERIOR TIBIAL  not palpable   not palpable         DORSALIS PEDIS      ANTERIOR TIBIAL not palpable  not palpable    Abdomen: soft, NT, no palpable  masses. Skin: no rashes, no ulcers noted. Musculoskeletal: no muscle wasting or atrophy.  Neurologic: A&O X 3; Appropriate Affect ; SENSATION: normal; MOTOR FUNCTION:  moving all extremities equally, motor strength 5/5 throughout. Speech is fluent/normal. CN 2-12 intact.    Non-Invasive Vascular Imaging: DATE: 01/28/2017  Femoral-femoral bypass graft Duplex: No color or spectral Doppler flow noted within the femorofemoral bypass graft just past the origin to the distal portion with homogenous plaque visualized.  This is the first duplex of the fem-fem bypass graft.   ABI: Right: 0.87 (0.79, 10-29-16) waveforms: PT: biphasic, DP: monophasic; TBI: 0.55 Left: 0.51 (0.55, 10-29-16) waveforms: PT: not detected, PT: monophasic; TBI: 0.38 Slightly improved arterial perfusion in the right LE, stable or slight decline in the left.   ASSESSMENT: Anthony Meadows is a 66 y.o. male who is s/p thrombectomy of right axillary to femoral bypass and  left to right femorofemoral bypass (Date: 08-16-16). He is also s/p thrombectomy of right axillofemoral bypass with exploration of right femoral artery and Dacron patch angioplasty of right femoral anastomosis on 04-12-15 by Dr. Arbie Cookey. He had extensive past vascular surgery treatment in [place].  He c/o worsening right calf claudication, but right ABI indicates only mild arterial occlusive disease. He also states that since he fell about January 2018, he has intermittent bilateral low back pain.  He does not c/o claudication in his left leg which has moderate arterial occlusive disease.    PLAN:  Graduated walking program discussed and how to achieve.   Based on the patient's vascular studies and examination, and after discussing with Dr. Arbie Cookey, pt will return  to clinic in 6 months with fem-fem bypass graft duplex and ABI's.   I discussed in depth with  the patient the nature of atherosclerosis, and emphasized the importance of maximal medical management including strict control of blood pressure, blood glucose, and lipid levels, obtaining regular exercise, and continued cessation of smoking.  The patient is aware that without maximal medical management the underlying atherosclerotic disease process will progress, limiting the benefit of any interventions.  The patient was given information about PAD including signs, symptoms, treatment, what symptoms should prompt the patient to seek immediate medical care, and risk reduction measures to take.  Charisse March, RN, MSN, FNP-C [facility] [place] Phone: [phone]  Clinic MD: Early  01/28/17 12:52 PM

## 2017-01-29 NOTE — Addendum Note (Signed)
Addended by: Burton Apley A on: 01/29/2017 09:24 AM   Modules accepted: Orders

## 2017-07-31 ENCOUNTER — Ambulatory Visit: Payer: Medicare Other | Admitting: Family

## 2017-07-31 ENCOUNTER — Encounter (HOSPITAL_COMMUNITY): Payer: Medicare Other

## 2017-08-20 ENCOUNTER — Ambulatory Visit (INDEPENDENT_AMBULATORY_CARE_PROVIDER_SITE_OTHER): Payer: Medicare Other | Admitting: Family

## 2017-08-20 ENCOUNTER — Ambulatory Visit (HOSPITAL_COMMUNITY)
Admission: RE | Admit: 2017-08-20 | Discharge: 2017-08-20 | Disposition: A | Discharge status: Home / Self Care | Payer: Medicare Other | Source: Ambulatory Visit | Attending: Family | Admitting: Family

## 2017-08-20 ENCOUNTER — Encounter: Payer: Self-pay | Admitting: Family

## 2017-08-20 ENCOUNTER — Ambulatory Visit (INDEPENDENT_AMBULATORY_CARE_PROVIDER_SITE_OTHER)
Admission: RE | Admit: 2017-08-20 | Discharge: 2017-08-20 | Disposition: A | Discharge status: Home / Self Care | Payer: Medicare Other | Source: Ambulatory Visit | Attending: Family | Admitting: Family

## 2017-08-20 VITALS — BP 126/70 | HR 65 | Temp 97.5°F | Resp 18 | Ht 67.0 in | Wt 160.0 lb

## 2017-08-20 DIAGNOSIS — Z95828 Presence of other vascular implants and grafts: Secondary | ICD-10-CM

## 2017-08-20 DIAGNOSIS — Z87891 Personal history of nicotine dependence: Secondary | ICD-10-CM

## 2017-08-20 DIAGNOSIS — I10 Essential (primary) hypertension: Secondary | ICD-10-CM | POA: Insufficient documentation

## 2017-08-20 DIAGNOSIS — I779 Disorder of arteries and arterioles, unspecified: Secondary | ICD-10-CM | POA: Insufficient documentation

## 2017-08-20 LAB — VAS US FEMORAL-FEMORAL BYPASS GRAFT
Left super femoral mid sys PSV: 0 cm/s
Left super femoral prox sys PSV: 54 cm/s

## 2017-08-20 NOTE — Patient Instructions (Signed)

## 2017-08-20 NOTE — Progress Notes (Signed)
[facility] [place]   CC: Follow up peripheral artery occlusive disease  History of Present Illness Anthony Meadows is a 66 y.o. male who is s/p thrombectomy ofright axillary to femoral bypass and also underwent a left to right femorofemoral bypass (Date: 08-16-16) by Dr. Arbie Cookey. He did well the hospital and was discharged to home.  He is also s/p thrombectomy of right axillofemoral bypass with exploration of right femoral artery and Dacron patch angioplasty of right femoral anastomosis on 04-12-15 by Dr. Arbie Cookey. He had extensive past vascular surgery treatment in [place]. Had presented on several occasions with an occluded right axillofemoral bypass with thrombectomy and revision of these. He presented in October 2017 with recurrent occlusion.  He returns today for routine follow up.  He fell about January 2018, and has had intermittent bilateral low back pain since then.  He has "stayed tired" since about April, 2017. Wife and pt deny that he snores.   The patient is able to complete his activities of daily living.   Dr. Arbie Cookey last evaluated pt on 09-10-16. At that time left groin incisions were well-healed. He hadaneasily palpable femorofemoral bypass and 2+ femoral pulses bilaterally. He hada 2+ right popliteal pulse. Dr. Arbie Cookey didnot palpate pedal pulses, butnoted that his feet were warm and well perfused. Stable overall. Has had recurrent occlusion of his axillofemoral bypass. Hopefully the addition of a femoral to femoral bypass will improve his patency. In the past he hadbeen deemed not an operative candidate for open aortic repair due to severe cardiac disease.   Pt meds include: Statin :Yes Betablocker: Yes ASA: No Other anticoagulants/antiplatelets: Xarelto, has atrial fib    Past Medical History:  Diagnosis Date  . AICD (automatic cardioverter/defibrillator) present    new device - 08/2015, [employer] DEVICE  . Anxiety   . Atrial  fibrillation with rapid ventricular response (HCC)   . CHF (congestive heart failure) (HCC)   . Complication of anesthesia    "takes him awhile to come out of it" (08/14/2016)  . COPD (chronic obstructive pulmonary disease) (HCC)   . Coronary artery disease   . Dysrhythmia    a-fib, h/o cardioversion   . Hypercholesterolemia   . Hypertension   . Ischemic cardiomyopathy   . Myocardial infarction [facility]) 2002   "before heart OR"  . On home oxygen therapy    "2L; only when I need it" (08/14/2016)  . Pre-diabetes    "put him on RX then took him off earlier this year" (08/14/2016)  . PVD (peripheral vascular disease) (HCC)     Social History Social History  Substance Use Topics  . Smoking status: Former Smoker    Packs/day: 3.00    Years: 52.00    Types: Cigarettes    Quit date: 08/04/2016  . Smokeless tobacco: Never Used  . Alcohol use No    Family History No family history on file.  Past Surgical History:  Procedure Laterality Date  . ANGIOPLASTY / STENTING FEMORAL Right ~2012  . CARDIAC CATHETERIZATION  2002  . CARDIAC DEFIBRILLATOR PLACEMENT  01/2008   Medtronic ; Virtuoso VR defibrillator; Model D154VWC; Serial [id number]  . CORONARY ANGIOPLASTY    . CORONARY ARTERY BYPASS GRAFT  2002   'CABG X5"  . FEMORAL-FEMORAL BYPASS GRAFT Bilateral 08/16/2016   Procedure: BYPASS GRAFT FEMORAL-FEMORAL ARTERY USING X 30CM HEMASHIELD GRAFT;  Surgeon: Larina Earthly, MD;  Location: [facility] OR;  Service: Vascular;  Laterality: Bilateral;  . PATCH ANGIOPLASTY Right 04/12/2015  Procedure: PATCH ANGIOPLASTY Right Femoral Artery;  Surgeon: Larina Earthly, MD;  [facility];  Service: Vascular;  Laterality: Right;  . THROMBECTOMY FEMORAL ARTERY Right 04/12/2015   Procedure: THROMBECTOMY Right Axilla-Femoral Gortex Graft;  Surgeon: Larina Earthly, MD;  Location: [facility] OR;  Service: Vascular;  Laterality: Right;  . THROMBECTOMY FEMORAL ARTERY Right 06/21/2016   Procedure: RIGHT AXILLARY-FEMORAL  THROMBECTOMY;  Surgeon: Larina Earthly, MD;  Location: [facility] OR;  Service: Vascular;  Laterality: Right;  . THROMBECTOMY FEMORAL ARTERY Right 08/16/2016   Procedure: THROMBECTOMY AXILLOFEMORAL BYPASS GRAFT;  Surgeon: Larina Earthly, MD;  Location: [facility] OR;  Service: Vascular;  Laterality: Right;    Allergies  Allergen Reactions  . Metformin And Related Diarrhea and Nausea And Vomiting  . Penicillins Swelling    SWELLING REACTION UNSPECIFIED  Has patient had a PCN reaction causing immediate rash, facial/tongue/throat swelling, SOB or lightheadedness with hypotension: Yes Has patient had a PCN reaction causing severe rash involving mucus membranes or skin necrosis: No Has patient had a PCN reaction that required hospitalization No Has patient had a PCN reaction occurring within the last 10 years: No If all of the above answers are "NO", then may proceed with Cephalosporin use.     Current Outpatient Prescriptions  Medication Sig Dispense Refill  . albuterol (PROVENTIL HFA;VENTOLIN HFA) 108 (90 Base) MCG/ACT inhaler Inhale 2 puffs into the lungs every 6 (six) hours as needed for wheezing or shortness of breath.     Marland Kitchen amiodarone (PACERONE) 200 MG tablet Take 100 mg by mouth daily.    Marland Kitchen atorvastatin (LIPITOR) 80 MG tablet Take 80 mg by mouth at bedtime.     . carvedilol (COREG) 25 MG tablet Take 25 mg by mouth 2 (two) times daily with a meal.    . Cholecalciferol (VITAMIN D3) 10000 units TABS Take 1 tablet by mouth daily after breakfast.    . LORazepam (ATIVAN) 0.5 MG tablet Take 0.5 mg by mouth at bedtime.     . Rivaroxaban (XARELTO) 15 MG TABS tablet Take 1 tablet (15 mg total) by mouth daily with supper. 42 tablet   . sacubitril-valsartan (ENTRESTO) 49-51 MG Take 1 tablet by mouth 2 (two) times daily.    Marland Kitchen torsemide (DEMADEX) 20 MG tablet Take 20 mg by mouth daily.     Marland Kitchen HYDROcodone-acetaminophen (NORCO/VICODIN) 5-325 MG tablet Take 1-2 tablets by mouth every 4 (four) hours as needed for moderate  pain. (Patient not taking: Reported on 08/20/2017) 30 tablet 0   No current facility-administered medications for this visit.     ROS: See HPI for pertinent positives and negatives.   Physical Examination  Vitals:   08/20/17 1513  BP: 126/70  Pulse: 65  Resp: 18  Temp: (!) 97.5 F (36.4 C)  SpO2: 97%  Weight: 160 lb (72.6 kg)  Height: 5\' 7"  (1.702 m)   Body mass index is 25.06 kg/m.  General: A&O x 3, WDWN, male. Gait: normal Eyes: PERRLA. Pulmonary: Respirations are non labored, fair air movement in all fields, no rales, rhonchi, or wheezes. Cardiac: regular rhythm, no detected murmur.  Pacemaker/AICD palpated left upper chest.       Carotid Bruits Right Left   Negative Negative   Abdominal aortic pulse is not palpable. Radial pulses: 2+ palpable bilaterally                           VASCULAR EXAM: Extremities without ischemic changes, without Gangrene; without  open wounds.  Femoral to femoral bypass graft pulse is not palpable. Right axillary to femoral bypass graft pulse is faintly palpable.                                                                                                                                                        LE Pulses Right Left       FEMORAL  2+ palpable  2+ palpable        POPLITEAL  1+ palpable   not palpable       POSTERIOR TIBIAL  not palpable   not palpable        DORSALIS PEDIS      ANTERIOR TIBIAL not palpable  not palpable    Abdomen: soft, NT, no palpable masses. Skin: no rashes, no ulcers noted. Musculoskeletal: no muscle wasting or atrophy.         Neurologic: A&O X 3; Appropriate Affect ; SENSATION: normal; MOTOR FUNCTION:  moving all extremities equally, motor strength 5/5 throughout. Speech is fluent/normal. CN 2-12 intact    ASSESSMENT: Lorelee MarketFaron Schadt is a 66 y.o. male who is s/p thrombectomy ofright axillary to femoral bypass and left to right femorofemoral bypass (Date: 08-16-16). He is  also s/p thrombectomy of right axillofemoral bypass with exploration of right femoral artery and Dacron patch angioplasty of right femoral anastomosis on 04-12-15 by Dr. Arbie CookeyEarly. He had extensive past vascular surgery treatment in [place].  He c/o stable right thigh claudication, but right ABI indicates only mild arterial occlusive disease. He also states that since he fell about January 2018, he has intermittent bilateral low back pain.  All toes of both feet are pink and warm with brisk capillary refill.  He does not c/o claudication in his left leg which has moderate arterial occlusive disease.   He stopped tobacco use in October 2017, does not have DM. He takes Xarelto for hx of atrial fib, also takes a statin.   DATA  Left to right femoral-femoral and right ax-fem bypass graft Duplex 08/20/17): Limited evaluation of left leg showed mid-thigh left SFA occlusion. Occluded left to right fem-fem bypass graft.  Patent right ax-fem bypass graft.  No significant change compared to previous study on 01-28-17.    ABI (Date: 08/20/2017):  R:   ABI: 0.91 (was 0.87 on 01-28-17),   PT: tri (was bi)  DP: tri (was mono)  TBI:  0.71 (was 0.55)  L:   ABI: 0.75 (was 0.51),   PT: mono (was not detected)  DP: mono (was mono)  TBI: 0.53 (was 0.38)  ABI: slightly improved in the right to mild disease with triphasic waveforms, significantly improved on the left to moderate disease with monophasic waveforms.   Graduated walking program discussed and how to achieve.   Based on the patient's vascular studies and  examination, and after discussing with Dr. Arbie Cookey, pt will return to clinic in 1 year with right ax-fem bypass graft duplex and ABI's.  I advised him to return sooner if needed.  I discussed in depth with the patient the nature of atherosclerosis, and emphasized the importance of maximal medical management including strict control of blood pressure, blood glucose, and lipid  levels, obtaining regular exercise, and continued cessation of smoking.  The patient is aware that without maximal medical management the underlying atherosclerotic disease process will progress, limiting the benefit of any interventions.  The patient was given information about PAD including signs, symptoms, treatment, what symptoms should prompt the patient to seek immediate medical care, and risk reduction measures to take.  Charisse March, RN, MSN, FNP-C [facility] [place] Phone: [phone]  Clinic MD: Chen/Early  08/20/17 3:46 PM

## 2017-08-21 NOTE — Addendum Note (Signed)
Addended by: Burton ApleyPETTY, Brileigh Sevcik A on: 08/21/2017 01:39 PM   Modules accepted: Orders

## 2018-11-18 ENCOUNTER — Other Ambulatory Visit: Payer: Self-pay

## 2018-11-18 DIAGNOSIS — I779 Disorder of arteries and arterioles, unspecified: Secondary | ICD-10-CM

## 2018-11-18 DIAGNOSIS — Z95828 Presence of other vascular implants and grafts: Secondary | ICD-10-CM

## 2018-11-18 DIAGNOSIS — Z87891 Personal history of nicotine dependence: Secondary | ICD-10-CM

## 2018-11-23 NOTE — Progress Notes (Signed)
HISTORY AND PHYSICAL     CC:  follow up. Requesting Provider:  Truddie Coco, FNP  HPI: This is a 69 y.o. male who is here today for follow up.  He has a complex PAD and cardiac hx.  He is status post multiple revascularizations of his right lower extremity and outlying hospitals in [place]. Also status post coronary artery bypass grafting in 2003 and also status post AICD placement.  He does have a history of multiple stents placed in his right common iliac artery and right superficial femoral artery. He subsequently underwent right axillary to femoral and femoral-popliteal bypass. The patient recalls that this was all during the same procedure around 2012. He apparently had a very stormy postoperative course. Had difficulty being extubated and eventually was able to leave the hospital.  In June 2016, he underwent Thrombectomy of right axillofemoral bypass with exploration of right femoral artery and Dacron patch angioplasty of right femoral anastomosis by Dr. Arbie Cookey.  In September 2017, he underwent Thrombectomy of right axillary to femoral bypass by Dr. Arbie Cookey.  In October 2017, he underwent #1 thrombectomy of right axillofemoral bypass, #2 left to right femorofemoral bypass by Dr. Arbie Cookey.  He was last seen here in November 2018 by the NP.  At that time, he had some intermittent bilateral low back pain.  The pt returns today for follow up.  He states that he is doing well.  He can walk about 200-335ft before his right leg begins to cramp.  He can stop, pain resolves and he can go the same distance again.  He does not have any non healing wounds on his feet.   Since he was here last, he had a mitral valve procedure called the MitraClip May 2019.  The pt is on a statin for cholesterol management.    The pt does not have diabetes. The pt is on a BB for hypertension.  The pt is not on an aspirin.  He is on Xarelto for afib. Tobacco hx:  Remote-quit 2017 but vapes occasionally  Past Medical  History:  Diagnosis Date  . AICD (automatic cardioverter/defibrillator) present    new device - 08/2015, [employer] DEVICE  . Anxiety   . Atrial fibrillation with rapid ventricular response (HCC)   . CHF (congestive heart failure) (HCC)   . Complication of anesthesia    "takes him awhile to come out of it" (08/14/2016)  . COPD (chronic obstructive pulmonary disease) (HCC)   . Coronary artery disease   . Dysrhythmia    a-fib, h/o cardioversion   . Hypercholesterolemia   . Hypertension   . Ischemic cardiomyopathy   . Myocardial infarction [facility]) 2002   "before heart OR"  . On home oxygen therapy    "2L; only when I need it" (08/14/2016)  . Pre-diabetes    "put him on RX then took him off earlier this year" (08/14/2016)  . PVD (peripheral vascular disease) (HCC)     Past Surgical History:  Procedure Laterality Date  . ANGIOPLASTY / STENTING FEMORAL Right ~2012  . CARDIAC CATHETERIZATION  2002  . CARDIAC DEFIBRILLATOR PLACEMENT  01/2008   Medtronic ; Virtuoso VR defibrillator; Model D154VWC; Serial [id number]  . CORONARY ANGIOPLASTY    . CORONARY ARTERY BYPASS GRAFT  2002   'CABG X5"  . FEMORAL-FEMORAL BYPASS GRAFT Bilateral 08/16/2016   Procedure: BYPASS GRAFT FEMORAL-FEMORAL ARTERY USING X 30CM HEMASHIELD GRAFT;  Surgeon: Larina Earthly, MD;  Location: [facility] OR;  Service: Vascular;  Laterality: Bilateral;  .  PATCH ANGIOPLASTY Right 04/12/2015   Procedure: PATCH ANGIOPLASTY Right Femoral Artery;  Surgeon: Larina Earthly, MD;  Location: [facility] OR;  Service: Vascular;  Laterality: Right;  . THROMBECTOMY FEMORAL ARTERY Right 04/12/2015   Procedure: THROMBECTOMY Right Axilla-Femoral Gortex Graft;  Surgeon: Larina Earthly, MD;  Location: [facility] OR;  Service: Vascular;  Laterality: Right;  . THROMBECTOMY FEMORAL ARTERY Right 06/21/2016   Procedure: RIGHT AXILLARY-FEMORAL THROMBECTOMY;  Surgeon: Larina Earthly, MD;  Location: [facility] OR;  Service: Vascular;  Laterality: Right;  . THROMBECTOMY FEMORAL ARTERY  Right 08/16/2016   Procedure: THROMBECTOMY AXILLOFEMORAL BYPASS GRAFT;  Surgeon: Larina Earthly, MD;  Location: [facility] OR;  Service: Vascular;  Laterality: Right;    Allergies  Allergen Reactions  . Metformin And Related Diarrhea and Nausea And Vomiting  . Penicillins Swelling    SWELLING REACTION UNSPECIFIED  Has patient had a PCN reaction causing immediate rash, facial/tongue/throat swelling, SOB or lightheadedness with hypotension: Yes Has patient had a PCN reaction causing severe rash involving mucus membranes or skin necrosis: No Has patient had a PCN reaction that required hospitalization No Has patient had a PCN reaction occurring within the last 10 years: No If all of the above answers are "NO", then may proceed with Cephalosporin use.     Current Outpatient Medications  Medication Sig Dispense Refill  . albuterol (PROVENTIL HFA;VENTOLIN HFA) 108 (90 Base) MCG/ACT inhaler Inhale 2 puffs into the lungs every 6 (six) hours as needed for wheezing or shortness of breath.     Marland Kitchen amiodarone (PACERONE) 200 MG tablet Take 100 mg by mouth daily.    Marland Kitchen atorvastatin (LIPITOR) 80 MG tablet Take 80 mg by mouth at bedtime.     . carvedilol (COREG) 25 MG tablet Take 25 mg by mouth 2 (two) times daily with a meal.    . Cholecalciferol (VITAMIN D3) 10000 units TABS Take 1 tablet by mouth daily after breakfast.    . HYDROcodone-acetaminophen (NORCO/VICODIN) 5-325 MG tablet Take 1-2 tablets by mouth every 4 (four) hours as needed for moderate pain. (Patient not taking: Reported on 08/20/2017) 30 tablet 0  . LORazepam (ATIVAN) 0.5 MG tablet Take 0.5 mg by mouth at bedtime.     . Rivaroxaban (XARELTO) 15 MG TABS tablet Take 1 tablet (15 mg total) by mouth daily with supper. 42 tablet   . sacubitril-valsartan (ENTRESTO) 49-51 MG Take 1 tablet by mouth 2 (two) times daily.    Marland Kitchen torsemide (DEMADEX) 20 MG tablet Take 20 mg by mouth daily.      No current facility-administered medications for this visit.      No family history on file.  Social History   Socioeconomic History  . Marital status: Married    Spouse name: Not on file  . Number of children: Not on file  . Years of education: Not on file  . Highest education level: Not on file  Occupational History  . Not on file  Social Needs  . Financial resource strain: Not on file  . Food insecurity:    Worry: Not on file    Inability: Not on file  . Transportation needs:    Medical: Not on file    Non-medical: Not on file  Tobacco Use  . Smoking status: Former Smoker    Packs/day: 3.00    Years: 52.00    Pack years: 156.00    Types: Cigarettes    Last attempt to quit: 08/04/2016    Years since quitting: 2.3  . Smokeless  tobacco: Never Used  Substance and Sexual Activity  . Alcohol use: No  . Drug use: No  . Sexual activity: Not Currently  Lifestyle  . Physical activity:    Days per week: Not on file    Minutes per session: Not on file  . Stress: Not on file  Relationships  . Social connections:    Talks on phone: Not on file    Gets together: Not on file    Attends religious service: Not on file    Active member of club or organization: Not on file    Attends meetings of clubs or organizations: Not on file    Relationship status: Not on file  . Intimate partner violence:    Fear of current or ex partner: Not on file    Emotionally abused: Not on file    Physically abused: Not on file    Forced sexual activity: Not on file  Other Topics Concern  . Not on file  Social History Narrative  . Not on file     REVIEW OF SYSTEMS:   [X]  denotes positive finding, [ ]  denotes negative finding Cardiac  Comments:  Chest pain or chest pressure: x   Shortness of breath upon exertion: x   Short of breath when lying flat: x   Irregular heart rhythm: x       Vascular    Pain in calf, thigh, or hip brought on by ambulation: x   Pain in feet at night that wakes you up from your sleep:  x   Blood clot in your veins:     Leg swelling:         Pulmonary    Oxygen at home:    Productive cough:     Wheezing:  x       Neurologic    Numbness/tingling right foot x   Sudden onset of difficulty speaking or slurred speech:    Temporary loss of vision in one eye:     Problems with dizziness:         Gastrointestinal    Blood in stool:     Vomited blood:         Genitourinary    Burning when urinating:     Blood in urine:        Psychiatric    Major depression:         Hematologic    Bleeding problems:    Problems with blood clotting too easily:        Skin    Rashes or ulcers:        Constitutional    Fever or chills:      PHYSICAL EXAMINATION:  Today's Vitals   11/24/18 1503  BP: 109/72  Pulse: 70  Resp: 18  SpO2: 96%  Weight: 156 lb (70.8 kg)  Height: 5\' 7"  (1.702 m)   Body mass index is 24.43 kg/m.   General:  WDWN in NAD; vital signs documented above Gait: Not observed HENT: WNL, normocephalic Pulmonary: normal non-labored breathing , without Rales, rhonchi,  wheezing Cardiac: regular HR, without  Murmurs; without carotid bruits Abdomen: soft, NT, no masses Skin: without rashes Vascular Exam/Pulses:  Right Left  Radial 2+ (normal) 2+ (normal)  Ulnar 1+ (weak) 1+ (weak)  Femoral 2+ (normal) 2+ (normal)  Popliteal Unable to palpate  Unable to palpate   DP monophasic monophasic  PT monophasic3 absent   Extremities: without ischemic changes, without Gangrene , without cellulitis; without open wounds; brisk  doppler signal in ax-fem bypass graft Musculoskeletal: no muscle wasting or atrophy  Neurologic: A&O X 3;  No focal weakness or paresthesias are detected Psychiatric:  The pt has Normal affect.   Non-Invasive Vascular Imaging:   ABI's/TBI's on 11/24/2018: Right:  0.7/0.51 Left:  0.62/0.4  Arterial duplex ax/fem bypass 11/24/2018: 50-70% stenosis by ratio  Left to right femoral-femoral and right ax-fem bypass graft Duplex 08/20/17): Limited evaluation of left  leg showed mid-thigh left SFA occlusion. Occluded left to right fem-fem bypass graft.  Patent right ax-fem bypass graft.  No significant change compared to previous study on 01-28-17.  Previous ABI's/TBI's on 08/20/17: Right:  0.91/0.71  PT (T)  DP (T) Left:  0.75/0.58  PT (M)  DP (M)  Pt meds includes: Statin:  Yes.   Beta Blocker:  Yes.   Aspirin:  No. ACEI:  No. ARB:  No. CCB use:  No Other Antiplatelet/Anticoagulant:  Yes Xarelto   ASSESSMENT/PLAN:: 68 y.o. male here for follow up for Thrombectomy of right axillofemoral bypass with exploration of right femoral artery and Dacron patch angioplasty of right femoral anastomosis by Dr. Arbie CookeyEarly.  In September 2017, he underwent Thrombectomy of right axillary to femoral bypass by Dr. Arbie CookeyEarly.  In October 2017, he underwent #1 thrombectomy of right axillofemoral bypass, #2 left to right femorofemoral bypass by Dr. Arbie CookeyEarly.   -pt doing well with slight decrease in bilateral ABI's.  He is able to walk 200-300 ft before claudicating.  He does not have non healing wounds. -duplex revealed 50-70% stenosis mid graft, however there is a brisk doppler signal in graft.  Discussed with dr. Arbie CookeyEarly and will repeat duplex in 6 months.   -pt will contact us sooner should he have any issues.    Doreatha MassedSamantha Oanh Devivo, PA-C [facility] [phone]  Clinic MD:   Early

## 2018-11-24 ENCOUNTER — Ambulatory Visit (INDEPENDENT_AMBULATORY_CARE_PROVIDER_SITE_OTHER): Payer: Medicare Other | Admitting: Physician Assistant

## 2018-11-24 ENCOUNTER — Ambulatory Visit (HOSPITAL_COMMUNITY)
Admission: RE | Admit: 2018-11-24 | Discharge: 2018-11-24 | Disposition: A | Discharge status: Home / Self Care | Payer: Medicare Other | Source: Ambulatory Visit | Attending: Family | Admitting: Family

## 2018-11-24 ENCOUNTER — Encounter: Payer: Self-pay | Admitting: Family

## 2018-11-24 ENCOUNTER — Other Ambulatory Visit: Payer: Self-pay

## 2018-11-24 ENCOUNTER — Ambulatory Visit (INDEPENDENT_AMBULATORY_CARE_PROVIDER_SITE_OTHER)
Admission: RE | Admit: 2018-11-24 | Discharge: 2018-11-24 | Disposition: A | Discharge status: Home / Self Care | Payer: Medicare Other | Source: Ambulatory Visit | Attending: Family | Admitting: Family

## 2018-11-24 VITALS — BP 109/72 | HR 70 | Resp 18 | Ht 67.0 in | Wt 156.0 lb

## 2018-11-24 DIAGNOSIS — I779 Disorder of arteries and arterioles, unspecified: Secondary | ICD-10-CM

## 2018-11-24 DIAGNOSIS — Z95828 Presence of other vascular implants and grafts: Secondary | ICD-10-CM | POA: Diagnosis not present

## 2018-11-24 DIAGNOSIS — Z87891 Personal history of nicotine dependence: Secondary | ICD-10-CM

## 2019-02-08 ENCOUNTER — Other Ambulatory Visit: Payer: Self-pay

## 2019-02-08 DIAGNOSIS — I779 Disorder of arteries and arterioles, unspecified: Secondary | ICD-10-CM

## 2019-02-22 ENCOUNTER — Encounter (HOSPITAL_COMMUNITY): Payer: Medicare Other

## 2019-02-22 ENCOUNTER — Ambulatory Visit: Payer: Medicare Other | Admitting: Family

## 2019-05-24 ENCOUNTER — Encounter (HOSPITAL_COMMUNITY): Payer: Medicare Other

## 2019-05-24 ENCOUNTER — Ambulatory Visit: Payer: Medicare Other | Admitting: Family

## 2019-06-04 ENCOUNTER — Ambulatory Visit (INDEPENDENT_AMBULATORY_CARE_PROVIDER_SITE_OTHER)
Admission: RE | Admit: 2019-06-04 | Discharge: 2019-06-04 | Disposition: A | Discharge status: Home / Self Care | Payer: Medicare Other | Source: Ambulatory Visit | Attending: Family | Admitting: Family

## 2019-06-04 ENCOUNTER — Encounter: Payer: Self-pay | Admitting: Family

## 2019-06-04 ENCOUNTER — Other Ambulatory Visit: Payer: Self-pay

## 2019-06-04 ENCOUNTER — Ambulatory Visit (HOSPITAL_COMMUNITY)
Admission: RE | Admit: 2019-06-04 | Discharge: 2019-06-04 | Disposition: A | Discharge status: Home / Self Care | Payer: Medicare Other | Source: Ambulatory Visit | Attending: Family | Admitting: Family

## 2019-06-04 ENCOUNTER — Telehealth: Payer: Self-pay | Admitting: *Deleted

## 2019-06-04 ENCOUNTER — Ambulatory Visit (INDEPENDENT_AMBULATORY_CARE_PROVIDER_SITE_OTHER): Payer: Medicare Other | Admitting: Family

## 2019-06-04 VITALS — BP 129/85 | HR 97 | Temp 97.0°F | Resp 16 | Ht 66.0 in | Wt 158.0 lb

## 2019-06-04 DIAGNOSIS — I779 Disorder of arteries and arterioles, unspecified: Secondary | ICD-10-CM

## 2019-06-04 DIAGNOSIS — Z95828 Presence of other vascular implants and grafts: Secondary | ICD-10-CM | POA: Diagnosis not present

## 2019-06-04 DIAGNOSIS — Z87891 Personal history of nicotine dependence: Secondary | ICD-10-CM | POA: Diagnosis not present

## 2019-06-04 NOTE — Telephone Encounter (Signed)
Virtual Visit Pre-Appointment Phone Call  Today, I spoke with Anthony Meadows and performed the following actions:  1. I explained that we are currently trying to limit exposure to the COVID-19 virus by seeing patients at home rather than in the office.  I explained that the visits are best done by video, but can be done by telephone.  I asked the patient if a virtual visit that the patient would like to try instead of coming into the office. Anthony Meadows agreed to proceed with the virtual visit scheduled with Vinnie Level Nickel NP on 06/04/19.     2. I confirmed the BEST phone number to call the day of the visit and- I included this in appointment notes.  3. I asked if the patient had access to (through a family member/friend) a smartphone with video capability to be used for his visit?"  The patient said yes -       4. I confirmed consent by  a. sending through [place] or by email the [place] as written at the end of this message or  b. verbally as listed below. i. This visit is being performed in the setting of COVID-19. ii. All virtual visits are billed to your insurance company just like a normal visit would be.   iii. We'd like you to understand that the technology does not allow for your provider to perform an examination, and thus may limit your provider's ability to fully assess your condition.  iv. If your provider identifies any concerns that need to be evaluated in person, we will make arrangements to do so.   v. Finally, though the technology is pretty good, we cannot assure that it will always work on either your or our end, and in the setting of a video visit, we may have to convert it to a phone-only visit.  In either situation, we cannot ensure that we have a secure connection.   vi. Are you willing to proceed?"  STAFF: Did the patient verbally acknowledge consent to telehealth visit? Document YES/NO here: YES  2. I advised the patient to be  prepared - I asked that the patient, on the day of his visit, record any information possible with the equipment at his home, such as blood pressure, pulse, oxygen saturation, and your weight and write them all down. I asked the patient to have a pen and paper handy nearby the day of the visit as well.  3. If the patient was scheduled for a video visit, I informed the patient that the visit with the doctor would start with a text to the smartphone # given to [place] by the patient.         If the patient was scheduled for a telephone call, I informed the patient that the visit with the doctor would start with a call to the telephone # given to [place] by the patient.  4. I Informed patient they will receive a phone call 15 minutes prior to their appointment time from a [place] or nurse to review medications, allergies, etc. to prepare for the visit.    TELEPHONE CALL NOTE  Anthony Meadows has been deemed a candidate for a follow-up tele-health visit to limit community exposure during the Covid-19 pandemic. I spoke with the patient via phone to ensure availability of phone/video source, confirm preferred email & phone number, and discuss instructions and expectations.  I reminded Anthony Meadows to be prepared with any vital sign and/or  heart rhythm information that could potentially be obtained via home monitoring, at the time of his visit. I reminded Anthony Meadows to expect a phone call prior to his visit.  Rudi CocoLathan, Lakisa Lotz M, NT 06/04/2019 10:50 AM     FULL LENGTH CONSENT FOR TELE-HEALTH VISIT   I hereby voluntarily request, consent and authorize [facility] and its employed or contracted physicians, physician assistants, nurse practitioners or other licensed health care professionals (the Practitioner), to provide me with telemedicine health care services (the "Services") as deemed necessary by the treating Practitioner. I acknowledge and consent to receive the Services by the Practitioner via telemedicine. I  understand that the telemedicine visit will involve communicating with the Practitioner through live audiovisual communication technology and the disclosure of certain medical information by electronic transmission. I acknowledge that I have been given the opportunity to request an in-person assessment or other available alternative prior to the telemedicine visit and am voluntarily participating in the telemedicine visit.  I understand that I have the right to withhold or withdraw my consent to the use of telemedicine in the course of my care at any time, without affecting my right to future care or treatment, and that the Practitioner or I may terminate the telemedicine visit at any time. I understand that I have the right to inspect all information obtained and/or recorded in the course of the telemedicine visit and may receive copies of available information for a reasonable fee.  I understand that some of the potential risks of receiving the Services via telemedicine include:  Marland Kitchen. Delay or interruption in medical evaluation due to technological equipment failure or disruption; . Information transmitted may not be sufficient (e.g. poor resolution of images) to allow for appropriate medical decision making by the Practitioner; and/or  . In rare instances, security protocols could fail, causing a breach of personal health information.  Furthermore, I acknowledge that it is my responsibility to provide information about my medical history, conditions and care that is complete and accurate to the best of my ability. I acknowledge that Practitioner's advice, recommendations, and/or decision may be based on factors not within their control, such as incomplete or inaccurate data provided by me or distortions of diagnostic images or specimens that may result from electronic transmissions. I understand that the practice of medicine is not an exact science and that Practitioner makes no warranties or guarantees  regarding treatment outcomes. I acknowledge that I will receive a copy of this consent concurrently upon execution via email to the email address I last provided but may also request a printed copy by calling the office of [facility].    I understand that my insurance will be billed for this visit.   I have read or had this consent read to me. . I understand the contents of this consent, which adequately explains the benefits and risks of the Services being provided via telemedicine.  . I have been provided ample opportunity to ask questions regarding this consent and the Services and have had my questions answered to my satisfaction. . I give my informed consent for the services to be provided through the use of telemedicine in my medical care  By participating in this telemedicine visit I agree to the above.

## 2019-06-04 NOTE — Progress Notes (Signed)
Virtual Visit via Telephone Note  I connected with Anthony Meadows on 06/04/2019 using the [employer] by telephone and verified that I was speaking with the correct person using two identifiers. Patient was located at his hpme and accompanied by his wife whom I spoke with. I am located at the [facility] office/clinic.   The limitations of evaluation and management by telemedicine and the availability of in person appointments have been previously discussed with the patient and are documented in the patients chart. The patient expressed understanding and consented to proceed.  PCP: Guy Begin, FNP  Chief Complaint: Follow up PAD  History of Present Illness: Anthony Meadows is a 68 y.o. male who is s/p thrombectomy ofright axillary to femoral bypass and also underwent a left to right femorofemoral bypass (Date: 08-16-16) by Dr. Donnetta Hutching. He did well the hospital and was discharged to home.  He is also s/p thrombectomy of right axillofemoral bypass with exploration of right femoral artery and Dacron patch angioplasty of right femoral anastomosis on 04-12-15 by Dr. Donnetta Hutching. He had extensive past vascular surgery treatment in [place]. Had presented on several occasions with an occluded right axillofemoral bypass with thrombectomy and revision of these. He presented in October 2017 with recurrent occlusion.  He returns today for routine follow up.  He fell about January 2018, and has had intermittent bilateral low back pain since then.  He has "stayed tired" since about April, 2017. Wife and pt deny that he snores.   The patient is able to complete hisactivities of daily living.   Dr. Donnetta Hutching last evaluated pt on 09-10-16. At that time left groin incisions were well-healed. He hadaneasily palpable femorofemoral bypass and 2+ femoral pulses bilaterally. He hada 2+ right popliteal pulse. Dr. Donnetta Hutching didnot palpate pedal pulses, butnoted that his feet were warm and well perfused. Stable overall.  Has had recurrent occlusion of his axillofemoral bypass. Hopefully the addition of a femoral to femoral bypass will improve his patency. In the past he hadbeen deemed not an operative candidate for open aortic repair due to severe cardiac disease.   Tobacco use: quit in 2018 DM: no  Pt meds include: Statin :Yes Betablocker: Yes ASA: No Other anticoagulants/antiplatelets: Xarelto, has atrial fib    Past Medical History:  Diagnosis Date  . AICD (automatic cardioverter/defibrillator) present    new device - 08/2015, [employer] DEVICE  . Anxiety   . Atrial fibrillation with rapid ventricular response ([place])   . CHF (congestive heart failure) ([place])   . Complication of anesthesia    "takes him awhile to come out of it" (08/14/2016)  . COPD (chronic obstructive pulmonary disease) ([place])   . Coronary artery disease   . Dysrhythmia    a-fib, h/o cardioversion   . Hypercholesterolemia   . Hypertension   . Ischemic cardiomyopathy   . Myocardial infarction [facility]) 2002   "before heart OR"  . On home oxygen therapy    "2L; only when I need it" (08/14/2016)  . Pre-diabetes    "put him on RX then took him off earlier this year" (08/14/2016)  . PVD (peripheral vascular disease) ([place])     Past Surgical History:  Procedure Laterality Date  . ANGIOPLASTY / STENTING FEMORAL Right ~2012  . CARDIAC CATHETERIZATION  2002  . CARDIAC DEFIBRILLATOR PLACEMENT  01/2008   Medtronic ; Virtuoso VR defibrillator; Model D154VWC; Serial [id number]  . CORONARY ANGIOPLASTY    . CORONARY ARTERY BYPASS GRAFT  2002   'CABG X5"  . FEMORAL-FEMORAL BYPASS  GRAFT Bilateral 08/16/2016   Procedure: BYPASS GRAFT FEMORAL-FEMORAL ARTERY USING 8MM X 30CM HEMASHIELD GRAFT;  Surgeon: Larina Earthlyodd F Early, MD;  [facility];  Service: Vascular;  Laterality: Bilateral;  . PATCH ANGIOPLASTY Right 04/12/2015   Procedure: PATCH ANGIOPLASTY Right Femoral Artery;  Surgeon: Larina Earthlyodd F Early, MD;  [facility];  Service: Vascular;   Laterality: Right;  . THROMBECTOMY FEMORAL ARTERY Right 04/12/2015   Procedure: THROMBECTOMY Right Axilla-Femoral Gortex Graft;  Surgeon: Larina Earthlyodd F Early, MD;  [facility];  Service: Vascular;  Laterality: Right;  . THROMBECTOMY FEMORAL ARTERY Right 06/21/2016   Procedure: RIGHT AXILLARY-FEMORAL THROMBECTOMY;  Surgeon: Larina Earthlyodd F Early, MD;  [facility];  Service: Vascular;  Laterality: Right;  . THROMBECTOMY FEMORAL ARTERY Right 08/16/2016   Procedure: THROMBECTOMY AXILLOFEMORAL BYPASS GRAFT;  Surgeon: Larina Earthlyodd F Early, MD;  [facility];  Service: Vascular;  Laterality: Right;    Current Meds  Medication Sig  . albuterol (PROVENTIL HFA;VENTOLIN HFA) 108 (90 Base) MCG/ACT inhaler Inhale 2 puffs into the lungs every 6 (six) hours as needed for wheezing or shortness of breath.   Marland Kitchen. amiodarone (PACERONE) 200 MG tablet Take 100 mg by mouth daily.  Marland Kitchen. atorvastatin (LIPITOR) 80 MG tablet Take 80 mg by mouth at bedtime.   . carvedilol (COREG) 25 MG tablet Take 25 mg by mouth 2 (two) times daily with a meal.  . Cholecalciferol (VITAMIN D3) 10000 units TABS Take 1 tablet by mouth daily after breakfast.  . LORazepam (ATIVAN) 0.5 MG tablet Take 0.5 mg by mouth at bedtime.   . Rivaroxaban (XARELTO) 15 MG TABS tablet Take 1 tablet (15 mg total) by mouth daily with supper.  . sacubitril-valsartan (ENTRESTO) 49-51 MG Take 1 tablet by mouth 2 (two) times daily.  Marland Kitchen. torsemide (DEMADEX) 20 MG tablet Take 20 mg by mouth daily.     12 system ROS was negative unless otherwise noted in HPI   Observations/Objective:  Right Graft #1: Axillary-fem (06-04-19): +------------------+--------+---------------+----------+--------+                   PSV cm/sStenosis       Waveform  Comments +------------------+--------+---------------+----------+--------+ Inflow            103                    biphasic           +------------------+--------+---------------+----------+--------+ Prox Anastomosis  41                      biphasic           +------------------+--------+---------------+----------+--------+ Proximal Graft    38                     biphasic           +------------------+--------+---------------+----------+--------+ Mid Graft         271     50-70% stenosismonophasic61/271   +------------------+--------+---------------+----------+--------+ Distal Graft      38                     monophasic         +------------------+--------+---------------+----------+--------+ Distal Anastomosis32                     biphasic           +------------------+--------+---------------+----------+--------+ Outflow           16  biphasic           +------------------+--------+---------------+----------+--------+  Summary: Right Graft(s): Axillary-fem bypass graft visualized with a 50-70% stenosis noted. Thrombus visualized.    Left Graft #1: L-R Fem-fem (06-04-19):                      PSV cm/sStenosisWaveform  Comments +--------------------+--------+--------+----------+--------+ Inflow              73              biphasic           +--------------------+--------+--------+----------+--------+ Proximal Anastomosis73              biphasic           +--------------------+--------+--------+----------+--------+ Proximal Graft              occluded                   +--------------------+--------+--------+----------+--------+ Mid Graft                   occluded                   +--------------------+--------+--------+----------+--------+ Distal Graft                occluded                   +--------------------+--------+--------+----------+--------+ Distal Anastamosis  27              monophasic         +--------------------+--------+--------+----------+--------+ Outflow             16              biphasic           +--------------------+--------+--------+----------+--------+ Summary: Left  Graft(s): L-R Fem-fem bypass graft visualized with a total occlusion noted. Images of the inflow, proximal anastomosis, distal anastomosis and outflow arteries are included in the axillary-femoral bypass graft study. No change compared to the duplex exam on 08-20-17.    ABI Findings (06-04-19): +---------+------------------+-----+-------------------+--------+ Right    Rt Pressure (mmHg)IndexWaveform           Comment  +---------+------------------+-----+-------------------+--------+ Brachial 110                                                +---------+------------------+-----+-------------------+--------+ PTA      80                0.73 dampened monophasic         +---------+------------------+-----+-------------------+--------+ DP       73                0.66 dampened monophasic         +---------+------------------+-----+-------------------+--------+ Great Toe77                0.70 Abnormal                    +---------+------------------+-----+-------------------+--------+  +---------+------------------+-----+----------+-------+ Left     Lt Pressure (mmHg)IndexWaveform  Comment +---------+------------------+-----+----------+-------+ Brachial 110                                      +---------+------------------+-----+----------+-------+ PTA      0  0.00 absent            +---------+------------------+-----+----------+-------+ DP       67                0.61 monophasic        +---------+------------------+-----+----------+-------+ Great Toe50                0.45 Abnormal          +---------+------------------+-----+----------+-------+  +-------+-----------+-----------+------------+------------+ ABI/TBIToday's ABIToday's TBIPrevious ABIPrevious TBI +-------+-----------+-----------+------------+------------+ Right  0.73       0.70       0.70        0.51          +-------+-----------+-----------+------------+------------+ Left   0.64       0.45       0.62        0.40         +-------+-----------+-----------+------------+------------+  Bilateral ABIs appear essentially unchanged compared to prior study on 11/24/2018. Right TBIs appear increased compared to prior study on 11/24/2018. Left TBIs appear essentially unchanged.   Summary: Right: Resting right ankle-brachial index indicates moderate right lower extremity arterial disease. The right toe-brachial index is normal. RT great toe pressure = 77 mmHg. PPG tracings appear dampened.  Left: Resting left ankle-brachial index indicates moderate left lower extremity arterial disease. The left toe-brachial index is abnormal. LT Great toe pressure = 50 mmHg. PPG tracings appear dampened.    Assessment and Plan: No change in ABI's compared to the last visit, moderate disease bilaterally, dampened monophasic signals in the right, in the left monophasic DP, absent PT.  Fem-fem bypass graft remains occluded, no need to re image. Right Ax fem bypass graft duplex shows 50-70% stenosis at the mid graft, stable compared to last duplex.   He claudicates at the same distance as his last visit, after walking about 200-300 feet, relived by rest.  He has no wounds at his lower extremities. He walks in his back yard.   Follow Up Instructions:   Follow up 6 months with right ax-fem bpg duplex and ABI's. I advised pt wife to notify us if he develops concerns re worsening circulation in his feet or legs.  Gradually increase walking in a safe environment.    I discussed the assessment and treatment plan with the patient. The patient was provided an opportunity to ask questions and all were answered. The patient agreed with the plan and demonstrated an understanding of the instructions.   The patient was advised to call back or seek an in-person evaluation if the symptoms worsen or if the condition fails to improve  as anticipated.  I spent 10 minutes with the patient's wife via telephone encounter.   Donnalee CurrySigned,   [facility] Office: [phone]  06/04/2019, 3:45 PM

## 2019-08-04 ENCOUNTER — Encounter (HOSPITAL_COMMUNITY)
Admission: RE | Disposition: A | Discharge status: Home / Self Care | Payer: Self-pay | Source: Ambulatory Visit | Attending: Vascular Surgery

## 2019-08-04 ENCOUNTER — Inpatient Hospital Stay (HOSPITAL_COMMUNITY)
Admission: RE | Admit: 2019-08-04 | Discharge: 2019-08-07 | DRG: 253 | Disposition: A | Discharge status: Home / Self Care | Payer: Medicare Other | Source: Ambulatory Visit | Attending: Vascular Surgery | Admitting: Vascular Surgery

## 2019-08-04 ENCOUNTER — Inpatient Hospital Stay (HOSPITAL_COMMUNITY): Payer: Medicare Other | Admitting: Certified Registered Nurse Anesthetist

## 2019-08-04 ENCOUNTER — Inpatient Hospital Stay (HOSPITAL_COMMUNITY): Payer: Medicare Other

## 2019-08-04 ENCOUNTER — Encounter (HOSPITAL_COMMUNITY): Payer: Self-pay | Admitting: Certified Registered"

## 2019-08-04 ENCOUNTER — Other Ambulatory Visit: Payer: Self-pay

## 2019-08-04 DIAGNOSIS — I252 Old myocardial infarction: Secondary | ICD-10-CM | POA: Diagnosis not present

## 2019-08-04 DIAGNOSIS — I482 Chronic atrial fibrillation, unspecified: Secondary | ICD-10-CM | POA: Diagnosis present

## 2019-08-04 DIAGNOSIS — I255 Ischemic cardiomyopathy: Secondary | ICD-10-CM | POA: Diagnosis present

## 2019-08-04 DIAGNOSIS — Z888 Allergy status to other drugs, medicaments and biological substances status: Secondary | ICD-10-CM | POA: Diagnosis not present

## 2019-08-04 DIAGNOSIS — E785 Hyperlipidemia, unspecified: Secondary | ICD-10-CM | POA: Diagnosis present

## 2019-08-04 DIAGNOSIS — Z95810 Presence of automatic (implantable) cardiac defibrillator: Secondary | ICD-10-CM

## 2019-08-04 DIAGNOSIS — J449 Chronic obstructive pulmonary disease, unspecified: Secondary | ICD-10-CM | POA: Diagnosis present

## 2019-08-04 DIAGNOSIS — Z20828 Contact with and (suspected) exposure to other viral communicable diseases: Secondary | ICD-10-CM | POA: Diagnosis present

## 2019-08-04 DIAGNOSIS — Z95828 Presence of other vascular implants and grafts: Secondary | ICD-10-CM

## 2019-08-04 DIAGNOSIS — I959 Hypotension, unspecified: Secondary | ICD-10-CM | POA: Diagnosis not present

## 2019-08-04 DIAGNOSIS — E0580 Other thyrotoxicosis without thyrotoxic crisis or storm: Secondary | ICD-10-CM | POA: Diagnosis present

## 2019-08-04 DIAGNOSIS — F419 Anxiety disorder, unspecified: Secondary | ICD-10-CM | POA: Diagnosis present

## 2019-08-04 DIAGNOSIS — Z87891 Personal history of nicotine dependence: Secondary | ICD-10-CM | POA: Diagnosis not present

## 2019-08-04 DIAGNOSIS — I5022 Chronic systolic (congestive) heart failure: Secondary | ICD-10-CM | POA: Diagnosis present

## 2019-08-04 DIAGNOSIS — Z23 Encounter for immunization: Secondary | ICD-10-CM | POA: Diagnosis present

## 2019-08-04 DIAGNOSIS — I2510 Atherosclerotic heart disease of native coronary artery without angina pectoris: Secondary | ICD-10-CM | POA: Diagnosis present

## 2019-08-04 DIAGNOSIS — Z951 Presence of aortocoronary bypass graft: Secondary | ICD-10-CM

## 2019-08-04 DIAGNOSIS — T462X5A Adverse effect of other antidysrhythmic drugs, initial encounter: Secondary | ICD-10-CM | POA: Diagnosis present

## 2019-08-04 DIAGNOSIS — E1151 Type 2 diabetes mellitus with diabetic peripheral angiopathy without gangrene: Secondary | ICD-10-CM | POA: Diagnosis present

## 2019-08-04 DIAGNOSIS — T82868A Thrombosis of vascular prosthetic devices, implants and grafts, initial encounter: Secondary | ICD-10-CM | POA: Diagnosis present

## 2019-08-04 DIAGNOSIS — Z880 Allergy status to penicillin: Secondary | ICD-10-CM | POA: Diagnosis not present

## 2019-08-04 DIAGNOSIS — I70229 Atherosclerosis of native arteries of extremities with rest pain, unspecified extremity: Secondary | ICD-10-CM | POA: Diagnosis present

## 2019-08-04 DIAGNOSIS — I779 Disorder of arteries and arterioles, unspecified: Secondary | ICD-10-CM | POA: Diagnosis not present

## 2019-08-04 DIAGNOSIS — E7800 Pure hypercholesterolemia, unspecified: Secondary | ICD-10-CM | POA: Diagnosis present

## 2019-08-04 DIAGNOSIS — J9600 Acute respiratory failure, unspecified whether with hypoxia or hypercapnia: Secondary | ICD-10-CM

## 2019-08-04 DIAGNOSIS — Z9981 Dependence on supplemental oxygen: Secondary | ICD-10-CM

## 2019-08-04 DIAGNOSIS — I739 Peripheral vascular disease, unspecified: Secondary | ICD-10-CM

## 2019-08-04 DIAGNOSIS — I110 Hypertensive heart disease with heart failure: Secondary | ICD-10-CM | POA: Diagnosis present

## 2019-08-04 DIAGNOSIS — Z452 Encounter for adjustment and management of vascular access device: Secondary | ICD-10-CM

## 2019-08-04 DIAGNOSIS — I998 Other disorder of circulatory system: Secondary | ICD-10-CM | POA: Diagnosis present

## 2019-08-04 DIAGNOSIS — I4819 Other persistent atrial fibrillation: Secondary | ICD-10-CM | POA: Diagnosis not present

## 2019-08-04 HISTORY — PX: FEMORAL-POPLITEAL BYPASS GRAFT: SHX937

## 2019-08-04 HISTORY — DX: Other persistent atrial fibrillation: I48.19

## 2019-08-04 LAB — CBC
HCT: 44 % (ref 39.0–52.0)
Hemoglobin: 15.1 g/dL (ref 13.0–17.0)
MCH: 34.6 pg — ABNORMAL HIGH (ref 26.0–34.0)
MCHC: 34.3 g/dL (ref 30.0–36.0)
MCV: 100.9 fL — ABNORMAL HIGH (ref 80.0–100.0)
Platelets: 235 10*3/uL (ref 150–400)
RBC: 4.36 MIL/uL (ref 4.22–5.81)
RDW: 13.5 % (ref 11.5–15.5)
WBC: 12.5 10*3/uL — ABNORMAL HIGH (ref 4.0–10.5)
nRBC: 0 % (ref 0.0–0.2)

## 2019-08-04 LAB — BASIC METABOLIC PANEL
Anion gap: 16 — ABNORMAL HIGH (ref 5–15)
BUN: 38 mg/dL — ABNORMAL HIGH (ref 8–23)
CO2: 21 mmol/L — ABNORMAL LOW (ref 22–32)
Calcium: 9.2 mg/dL (ref 8.9–10.3)
Chloride: 102 mmol/L (ref 98–111)
Creatinine, Ser: 2.03 mg/dL — ABNORMAL HIGH (ref 0.61–1.24)
GFR calc Af Amer: 38 mL/min — ABNORMAL LOW (ref >60)
GFR calc non Af Amer: 33 mL/min — ABNORMAL LOW (ref >60)
Glucose, Bld: 158 mg/dL — ABNORMAL HIGH (ref 70–99)
Potassium: 4.1 mmol/L (ref 3.5–5.1)
Sodium: 139 mmol/L (ref 135–145)

## 2019-08-04 LAB — PROTIME-INR
INR: 1.7 — ABNORMAL HIGH (ref 0.8–1.2)
Prothrombin Time: 19.8 seconds — ABNORMAL HIGH (ref 11.4–15.2)

## 2019-08-04 LAB — SURGICAL PCR SCREEN
MRSA, PCR: NEGATIVE
Staphylococcus aureus: NEGATIVE

## 2019-08-04 LAB — TYPE AND SCREEN
ABO/RH(D): O POS
Antibody Screen: NEGATIVE

## 2019-08-04 LAB — HEMOGLOBIN A1C
Hgb A1c MFr Bld: 7.3 % — ABNORMAL HIGH (ref 4.8–5.6)
Mean Plasma Glucose: 162.81 mg/dL

## 2019-08-04 LAB — SARS CORONAVIRUS 2 BY RT PCR (HOSPITAL ORDER, PERFORMED IN ~~LOC~~ HOSPITAL LAB): SARS Coronavirus 2: NEGATIVE

## 2019-08-04 SURGERY — BYPASS GRAFT FEMORAL-POPLITEAL ARTERY
Anesthesia: General | Site: Leg Upper | Laterality: Right

## 2019-08-04 MED ORDER — VASOPRESSIN 20 UNIT/ML IV SOLN
0.0300 [IU]/min | INTRAVENOUS | Status: AC
  Administered 2019-08-04: 0.03 [IU]/min via INTRAVENOUS
  Filled 2019-08-04: qty 2

## 2019-08-04 MED ORDER — 0.9 % SODIUM CHLORIDE (POUR BTL) OPTIME
TOPICAL | Status: DC | PRN
  Administered 2019-08-04: 2000 mL

## 2019-08-04 MED ORDER — NOREPINEPHRINE 4 MG/250ML-% IV SOLN
0.0000 ug/min | INTRAVENOUS | Status: AC
  Administered 2019-08-04: 1 ug/min via INTRAVENOUS
  Filled 2019-08-04: qty 250

## 2019-08-04 MED ORDER — MUPIROCIN 2 % EX OINT
1.0000 "application " | TOPICAL_OINTMENT | Freq: Once | CUTANEOUS | Status: AC
  Administered 2019-08-04: 1 via TOPICAL

## 2019-08-04 MED ORDER — CEFAZOLIN SODIUM-DEXTROSE 2-3 GM-%(50ML) IV SOLR
INTRAVENOUS | Status: DC | PRN
  Administered 2019-08-04: 2 g via INTRAVENOUS

## 2019-08-04 MED ORDER — LACTATED RINGERS IV SOLN
INTRAVENOUS | Status: DC | PRN
  Administered 2019-08-04 – 2019-08-05 (×2): via INTRAVENOUS

## 2019-08-04 MED ORDER — ETOMIDATE 2 MG/ML IV SOLN
INTRAVENOUS | Status: DC | PRN
  Administered 2019-08-04: 14 mg via INTRAVENOUS

## 2019-08-04 MED ORDER — ROCURONIUM BROMIDE 100 MG/10ML IV SOLN
INTRAVENOUS | Status: DC | PRN
  Administered 2019-08-04: 50 mg via INTRAVENOUS

## 2019-08-04 MED ORDER — HEPARIN (PORCINE) 25000 UT/250ML-% IV SOLN
850.0000 [IU]/h | INTRAVENOUS | Status: DC
  Administered 2019-08-04 – 2019-08-05 (×2): 850 [IU]/h via INTRAVENOUS
  Filled 2019-08-04 (×2): qty 250

## 2019-08-04 MED ORDER — FENTANYL CITRATE (PF) 100 MCG/2ML IJ SOLN
INTRAMUSCULAR | Status: DC | PRN
  Administered 2019-08-04 (×3): 50 ug via INTRAVENOUS

## 2019-08-04 MED ORDER — PHENYLEPHRINE 40 MCG/ML (10ML) SYRINGE FOR IV PUSH (FOR BLOOD PRESSURE SUPPORT)
PREFILLED_SYRINGE | INTRAVENOUS | Status: AC
  Filled 2019-08-04: qty 30

## 2019-08-04 MED ORDER — SODIUM CHLORIDE 0.9 % IV SOLN
INTRAVENOUS | Status: AC
  Filled 2019-08-04: qty 1.2

## 2019-08-04 MED ORDER — LIDOCAINE HCL (CARDIAC) PF 100 MG/5ML IV SOSY
PREFILLED_SYRINGE | INTRAVENOUS | Status: DC | PRN
  Administered 2019-08-04: 60 mg via INTRAVENOUS

## 2019-08-04 MED ORDER — SODIUM CHLORIDE 0.9 % IV SOLN
INTRAVENOUS | Status: DC | PRN
  Administered 2019-08-04: 500 mL

## 2019-08-04 MED ORDER — SUCCINYLCHOLINE CHLORIDE 20 MG/ML IJ SOLN
INTRAMUSCULAR | Status: DC | PRN
  Administered 2019-08-04: 140 mg via INTRAVENOUS

## 2019-08-04 MED ORDER — MIDAZOLAM HCL 5 MG/5ML IJ SOLN
INTRAMUSCULAR | Status: DC | PRN
  Administered 2019-08-04 (×2): 1 mg via INTRAVENOUS

## 2019-08-04 MED ORDER — LACTATED RINGERS IV SOLN: INTRAVENOUS | Status: DC

## 2019-08-04 MED ORDER — MIDAZOLAM HCL 2 MG/2ML IJ SOLN
INTRAMUSCULAR | Status: AC
  Filled 2019-08-04: qty 2

## 2019-08-04 MED ORDER — FENTANYL CITRATE (PF) 250 MCG/5ML IJ SOLN
INTRAMUSCULAR | Status: AC
  Filled 2019-08-04: qty 5

## 2019-08-04 SURGICAL SUPPLY — 47 items
BANDAGE ESMARK 6X9 LF (GAUZE/BANDAGES/DRESSINGS) IMPLANT
BNDG ESMARK 6X9 LF (GAUZE/BANDAGES/DRESSINGS)
CANISTER SUCT 3000ML PPV (MISCELLANEOUS) ×2 IMPLANT
CANNULA VESSEL 3MM 2 BLNT TIP (CANNULA) ×4 IMPLANT
CATH EMB 4FR 80CM (CATHETERS) ×4 IMPLANT
CLIP LIGATING EXTRA MED SLVR (CLIP) ×2 IMPLANT
CLIP LIGATING EXTRA SM BLUE (MISCELLANEOUS) ×2 IMPLANT
COVER WAND RF STERILE (DRAPES) ×2 IMPLANT
CUFF TOURN SGL QUICK 34 (TOURNIQUET CUFF)
CUFF TOURN SGL QUICK 42 (TOURNIQUET CUFF) IMPLANT
CUFF TRNQT CYL 34X4.125X (TOURNIQUET CUFF) IMPLANT
DERMABOND ADVANCED (GAUZE/BANDAGES/DRESSINGS) ×1
DERMABOND ADVANCED .7 DNX12 (GAUZE/BANDAGES/DRESSINGS) ×1 IMPLANT
DRAIN SNY 10X20 3/4 PERF (WOUND CARE) IMPLANT
DRAPE HALF SHEET 40X57 (DRAPES) IMPLANT
DRAPE X-RAY CASS 24X20 (DRAPES) IMPLANT
ELECT REM PT RETURN 9FT ADLT (ELECTROSURGICAL) ×2
ELECTRODE REM PT RTRN 9FT ADLT (ELECTROSURGICAL) ×1 IMPLANT
EVACUATOR SILICONE 100CC (DRAIN) IMPLANT
GLOVE SS BIOGEL STRL SZ 7.5 (GLOVE) ×1 IMPLANT
GLOVE SUPERSENSE BIOGEL SZ 7.5 (GLOVE) ×1
GOWN STRL REUS W/ TWL LRG LVL3 (GOWN DISPOSABLE) ×3 IMPLANT
GOWN STRL REUS W/TWL LRG LVL3 (GOWN DISPOSABLE) ×3
INSERT FOGARTY SM (MISCELLANEOUS) IMPLANT
KIT BASIN OR (CUSTOM PROCEDURE TRAY) ×2 IMPLANT
KIT TURNOVER KIT B (KITS) ×2 IMPLANT
NS IRRIG 1000ML POUR BTL (IV SOLUTION) ×4 IMPLANT
PACK PERIPHERAL VASCULAR (CUSTOM PROCEDURE TRAY) ×2 IMPLANT
PAD ARMBOARD 7.5X6 YLW CONV (MISCELLANEOUS) ×4 IMPLANT
PADDING CAST COTTON 6X4 STRL (CAST SUPPLIES) IMPLANT
SET COLLECT BLD 21X3/4 12 (NEEDLE) IMPLANT
STOPCOCK 4 WAY LG BORE MALE ST (IV SETS) ×2 IMPLANT
SUT ETHILON 3 0 PS 1 (SUTURE) IMPLANT
SUT PROLENE 5 0 C 1 24 (SUTURE) ×2 IMPLANT
SUT PROLENE 6 0 CC (SUTURE) ×4 IMPLANT
SUT SILK 2 0 SH (SUTURE) IMPLANT
SUT VIC AB 2-0 CT1 27 (SUTURE) ×1
SUT VIC AB 2-0 CT1 TAPERPNT 27 (SUTURE) ×1 IMPLANT
SUT VIC AB 2-0 CTX 36 (SUTURE) IMPLANT
SUT VIC AB 3-0 SH 27 (SUTURE) ×1
SUT VIC AB 3-0 SH 27X BRD (SUTURE) ×1 IMPLANT
SYR 3ML LL SCALE MARK (SYRINGE) ×4 IMPLANT
TOWEL GREEN STERILE (TOWEL DISPOSABLE) ×2 IMPLANT
TRAY FOLEY MTR SLVR 16FR STAT (SET/KITS/TRAYS/PACK) ×2 IMPLANT
TUBING EXTENTION W/L.L. (IV SETS) IMPLANT
UNDERPAD 30X30 (UNDERPADS AND DIAPERS) ×2 IMPLANT
WATER STERILE IRR 1000ML POUR (IV SOLUTION) ×2 IMPLANT

## 2019-08-04 NOTE — Anesthesia Procedure Notes (Signed)
Central Venous Catheter Insertion Performed by: Nolon Nations, MD, anesthesiologist Start/End10/14/2020 7:00 PM, 08/04/2019 7:15 PM Patient location: Pre-op. Preanesthetic checklist: patient identified, IV checked, site marked, risks and benefits discussed, surgical consent, monitors and equipment checked, pre-op evaluation, timeout performed and anesthesia consent Lidocaine 1% used for infiltration and patient sedated Hand hygiene performed  and maximum sterile barriers used  Catheter size: 9 Fr MAC introducer Procedure performed using ultrasound guided technique. Ultrasound Notes:anatomy identified, needle tip was noted to be adjacent to the nerve/plexus identified, no ultrasound evidence of intravascular and/or intraneural injection and image(s) printed for medical record Attempts: 1 Following insertion, line sutured, dressing applied and Biopatch. Post procedure assessment: blood return through all ports, free fluid flow and no air  Patient tolerated the procedure well with no immediate complications.

## 2019-08-04 NOTE — Anesthesia Procedure Notes (Signed)
Arterial Line Insertion Start/End10/14/2020 7:20 PM, 08/04/2019 7:25 PM Performed by: Nolon Nations, MD  Patient location: Pre-op. Preanesthetic checklist: patient identified, IV checked, site marked, risks and benefits discussed, surgical consent, monitors and equipment checked, pre-op evaluation, timeout performed and anesthesia consent Lidocaine 1% used for infiltration Left, radial was placed Catheter size: 20 Fr Hand hygiene performed  and maximum sterile barriers used  Allen's test indicative of satisfactory collateral circulation Attempts: 2 Procedure performed using ultrasound guided technique. Ultrasound Notes:anatomy identified, needle tip was noted to be adjacent to the nerve/plexus identified, no ultrasound evidence of intravascular and/or intraneural injection and image(s) printed for medical record Following insertion, Biopatch. Post procedure assessment: normal and unchanged

## 2019-08-04 NOTE — H&P (Signed)
[facility] [place]  Patient name: Anthony Meadows MRN: [id number] DOB: 07-23-1951 Sex: male   HPI: Anthony Meadows is a 68 y.o. male presenting with right leg ischemia.  He is well-known to me from prior events.  He has a very complex past history.  He had multiple right leg vascular interventions and surgeries in [place] approximately 2012.  He had presented to our's facility in 2016 with recurrent right leg ischemia.  At that time he had an occluded right axillary to femoral bypass.  He also had had a prior vein femoral to popliteal bypass.  It had multiple angioplasty interventions in his iliac and superficial femoral artery in [place].  Patient is also status post coronary artery bypass grafting.  He underwent thrombectomy of his axillofemoral bypass by myself in June 2016 and a patch of the arterial anastomosis.  At that time he was found to have no inflow from his native iliac artery.  He presented 15 months later in September 2017 again with thrombosis of his right axillary to femoral bypass.  He underwent thrombectomy of this again.  He had early reocclusion and was taken back to the operating room a month and a half later on August 16, 2016 where he underwent thrombectomy of his right axillary and femoral bypass but also underwent a left to right femoral to femoral bypass to hopefully get better inflow to his right groin.  He has done well in the 3-year since then.  He presents today to the [facility] emergency room was found to have severe ischemia there with no Doppler flow in his foot.  He was transferred to [facility] for definitive treatment.  He reports to me that he has had approximately 1 week of ischemic symptoms and this is progressed over that week.  He is able to walk with some difficulty and is able to feel light sensation in his foot.  Past Medical History:  Diagnosis Date  . AICD (automatic  cardioverter/defibrillator) present    new device - 08/2015, [employer] DEVICE  . Anxiety   . Atrial fibrillation with rapid ventricular response ([place])   . CHF (congestive heart failure) ([place])   . Complication of anesthesia    "takes him awhile to come out of it" (08/14/2016)  . COPD (chronic obstructive pulmonary disease) ([place])   . Coronary artery disease   . Dysrhythmia    a-fib, h/o cardioversion   . Hypercholesterolemia   . Hypertension   . Ischemic cardiomyopathy   . Myocardial infarction [facility]) 2002   "before heart OR"  . On home oxygen therapy    "2L; only when I need it" (08/14/2016)  . Pre-diabetes    "put him on RX then took him off earlier this year" (08/14/2016)  . PVD (peripheral vascular disease) ([place])     History reviewed. No pertinent family history.  SOCIAL HISTORY: Social History   Tobacco Use  . Smoking status: Former Smoker    Packs/day: 3.00    Years: 52.00    Pack years: 156.00    Types: Cigarettes    Quit date: 08/04/2016    Years since quitting: 3.0  . Smokeless tobacco: Never Used  Substance Use Topics  . Alcohol use: No    Allergies  Allergen Reactions  .  Amiodarone Other (See Comments)    Amio-induced hyperthyroidism    . Metformin Diarrhea and Nausea And Vomiting  . Metformin And Related Diarrhea and Nausea And Vomiting  . Penicillins Hives and Swelling    Did it involve swelling of the face/tongue/throat, SOB, or low BP? Yes Did it involve sudden or severe rash/hives, skin peeling, or any reaction on the inside of your mouth or nose? No Did you need to seek medical attention at a hospital or doctor's office? No When did it last happen? More than 10 years ago If all above answers are "NO", may proceed with cephalosporin use.    Current Facility-Administered Medications  Medication Dose Route Frequency Provider Last Rate Last Dose  . 0.9 % irrigation (POUR BTL)    PRN Larina Earthly, MD   1,000 mL at 08/04/19 1812  . heparin 6,000  Units in sodium chloride 0.9 % 500 mL irrigation    PRN Early, Kristen Loader, MD   500 mL at 08/04/19 1812  . lactated ringers infusion   Intravenous Continuous Cecile Hearing, MD      . norepinephrine (LEVOPHED) 4mg  in premix infusion  0-40 mcg/min Intravenous To OR Early, , MD      . vasopressin (PITRESSIN) 40 Units in sodium chloride 0.9 % 250 mL (0.16 Units/mL) infusion  0.03 Units/min Intravenous To OR Early, Kristen Loader, MD        REVIEW OF SYSTEMS:  [X]  denotes positive finding, [ ]  denotes negative finding Cardiac  Comments:  Chest pain or chest pressure:    Shortness of breath upon exertion:    Short of breath when lying flat:    Irregular heart rhythm:        Vascular    Pain in calf, thigh, or hip brought on by ambulation: x   Pain in feet at night that wakes you up from your sleep:     Blood clot in your veins:    Leg swelling:  x         PHYSICAL EXAM: There were no vitals filed for this visit.  GENERAL: The patient is a well-nourished male, in no acute distress. The vital signs are documented above. CARDIOVASCULAR: No palpable pulse in his right axillofemoral or femorofemoral bypass.  No Doppler flow in these bypasses.  No Doppler flow in his right foot.  Does have Doppler flow in his left foot PULMONARY: There is good air exchange  MUSCULOSKELETAL: There are no major deformities or cyanosis. NEUROLOGIC: No focal weakness or paresthesias are detected. SKIN: There are no ulcers or rashes noted. PSYCHIATRIC: The patient has a normal affect.  DATA:  None  MEDICAL ISSUES: Occlusion of his bypass.  He had been on chronic anticoagulation but apparently this was stopped for recent shoulder malignant manipulation for frozen shoulder.  He will be taken urgently to the operating room tonight for thrombectomy and possible revision    Kristen Loader, MD [facility] Tel [phone] Pager [phone]

## 2019-08-04 NOTE — Anesthesia Preprocedure Evaluation (Addendum)
Anesthesia Evaluation  Patient identified by MRN, date of birth, ID band Patient awake    Reviewed: Allergy & Precautions, NPO status , Patient's Chart, lab work & pertinent test results, reviewed documented beta blocker date and time   Airway Mallampati: II  TM Distance: >3 FB Neck ROM: Full    Dental  (+) Dental Advisory Given, Loose, Teeth Intact,    Pulmonary COPD, Current Smoker, former smoker,    breath sounds clear to auscultation       Cardiovascular hypertension, + CAD, + Past MI, + Peripheral Vascular Disease, +CHF and + Orthopnea  + dysrhythmias Atrial Fibrillation + Cardiac Defibrillator + Valvular Problems/Murmurs MR  Rhythm:Regular Rate:Normal  S/P mitra clip at [facility] 02/2018  Echo 2017 - Left ventricle: The cavity size was mildly dilated. Wall   thickness was normal. Systolic function was severely reduced. The estimated ejection fraction was in the range of 20% to 25%.  Diffuse hypokinesis. - Mitral valve: There was moderate regurgitation. - Left atrium: The atrium was moderately dilated. - Pulmonary arteries: Systolic pressure was mildly increased. PA  peak pressure: 32 mm Hg (S).     Neuro/Psych Anxiety    GI/Hepatic   Endo/Other  diabetes, Well Controlled, Type 2, Oral Hypoglycemic Agents  Renal/GU Renal disease     Musculoskeletal   Abdominal   Peds  Hematology   Anesthesia Other Findings   Reproductive/Obstetrics                           Anesthesia Physical  Anesthesia Plan  ASA: IV and emergent  Anesthesia Plan: General   Post-op Pain Management:    Induction: Intravenous  PONV Risk Score and Plan: 3 and Ondansetron, Dexamethasone and Treatment may vary due to age or medical condition  Airway Management Planned: Oral ETT  Additional Equipment: Arterial line and CVP  Intra-op Plan:   Post-operative Plan: Possible Post-op intubation/ventilation  Informed  Consent: I have reviewed the patients History and Physical, chart, labs and discussed the procedure including the risks, benefits and alternatives for the proposed anesthesia with the patient or authorized representative who has indicated his/her understanding and acceptance.     Dental advisory given  Plan Discussed with: CRNA  Anesthesia Plan Comments:       Anesthesia Quick Evaluation

## 2019-08-04 NOTE — Anesthesia Procedure Notes (Signed)
Procedure Name: Intubation Date/Time: 08/04/2019 11:18 PM Performed by: Clovis Cao, CRNA Pre-anesthesia Checklist: Patient identified, Emergency Drugs available, Suction available, Patient being monitored and Timeout performed Patient Re-evaluated:Patient Re-evaluated prior to induction Oxygen Delivery Method: Circle system utilized Preoxygenation: Pre-oxygenation with 100% oxygen Induction Type: IV induction, Rapid sequence and Cricoid Pressure applied Laryngoscope Size: Miller and 2 Grade View: Grade I Tube type: Oral Tube size: 8.0 mm Number of attempts: 1 Airway Equipment and Method: Stylet Placement Confirmation: ETT inserted through vocal cords under direct vision,  positive ETCO2 and breath sounds checked- equal and bilateral Secured at: 22 cm Tube secured with: Tape Dental Injury: Teeth and Oropharynx as per pre-operative assessment

## 2019-08-05 ENCOUNTER — Inpatient Hospital Stay (HOSPITAL_COMMUNITY): Payer: Medicare Other

## 2019-08-05 ENCOUNTER — Encounter (HOSPITAL_COMMUNITY): Payer: Self-pay | Admitting: Vascular Surgery

## 2019-08-05 DIAGNOSIS — T82868A Thrombosis of vascular prosthetic devices, implants and grafts, initial encounter: Secondary | ICD-10-CM | POA: Diagnosis present

## 2019-08-05 DIAGNOSIS — I998 Other disorder of circulatory system: Secondary | ICD-10-CM

## 2019-08-05 DIAGNOSIS — I779 Disorder of arteries and arterioles, unspecified: Secondary | ICD-10-CM

## 2019-08-05 DIAGNOSIS — I70229 Atherosclerosis of native arteries of extremities with rest pain, unspecified extremity: Secondary | ICD-10-CM | POA: Diagnosis present

## 2019-08-05 LAB — GLUCOSE, CAPILLARY
Glucose-Capillary: 157 mg/dL — ABNORMAL HIGH (ref 70–99)
Glucose-Capillary: 162 mg/dL — ABNORMAL HIGH (ref 70–99)
Glucose-Capillary: 169 mg/dL — ABNORMAL HIGH (ref 70–99)
Glucose-Capillary: 174 mg/dL — ABNORMAL HIGH (ref 70–99)
Glucose-Capillary: 174 mg/dL — ABNORMAL HIGH (ref 70–99)
Glucose-Capillary: 207 mg/dL — ABNORMAL HIGH (ref 70–99)

## 2019-08-05 LAB — POCT I-STAT 7, (LYTES, BLD GAS, ICA,H+H)
Acid-base deficit: 3 mmol/L — ABNORMAL HIGH (ref 0.0–2.0)
Acid-base deficit: 4 mmol/L — ABNORMAL HIGH (ref 0.0–2.0)
Bicarbonate: 21 mmol/L (ref 20.0–28.0)
Bicarbonate: 20 mmol/L (ref 20.0–28.0)
Calcium, Ion: 1.08 mmol/L — ABNORMAL LOW (ref 1.15–1.40)
Calcium, Ion: 1.12 mmol/L — ABNORMAL LOW (ref 1.15–1.40)
HCT: 30 % — ABNORMAL LOW (ref 39.0–52.0)
HCT: 29 % — ABNORMAL LOW (ref 39.0–52.0)
Hemoglobin: 10.2 g/dL — ABNORMAL LOW (ref 13.0–17.0)
Hemoglobin: 9.9 g/dL — ABNORMAL LOW (ref 13.0–17.0)
O2 Saturation: 100 %
O2 Saturation: 100 %
Patient temperature: 36
Patient temperature: 95.8
Potassium: 4.3 mmol/L (ref 3.5–5.1)
Potassium: 4.4 mmol/L (ref 3.5–5.1)
Sodium: 141 mmol/L (ref 135–145)
Sodium: 140 mmol/L (ref 135–145)
TCO2: 22 mmol/L (ref 22–32)
TCO2: 21 mmol/L — ABNORMAL LOW (ref 22–32)
pCO2 arterial: 32.9 mmHg (ref 32.0–48.0)
pCO2 arterial: 29.3 mmHg — ABNORMAL LOW (ref 32.0–48.0)
pH, Arterial: 7.409 (ref 7.350–7.450)
pH, Arterial: 7.435 (ref 7.350–7.450)
pO2, Arterial: 238 mmHg — ABNORMAL HIGH (ref 83.0–108.0)
pO2, Arterial: 271 mmHg — ABNORMAL HIGH (ref 83.0–108.0)

## 2019-08-05 LAB — CBC
HCT: 30.4 % — ABNORMAL LOW (ref 39.0–52.0)
Hemoglobin: 9.8 g/dL — ABNORMAL LOW (ref 13.0–17.0)
MCH: 33.4 pg (ref 26.0–34.0)
MCHC: 32.2 g/dL (ref 30.0–36.0)
MCV: 103.8 fL — ABNORMAL HIGH (ref 80.0–100.0)
Platelets: 188 10*3/uL (ref 150–400)
RBC: 2.93 MIL/uL — ABNORMAL LOW (ref 4.22–5.81)
RDW: 13.7 % (ref 11.5–15.5)
WBC: 19.6 10*3/uL — ABNORMAL HIGH (ref 4.0–10.5)
nRBC: 0 % (ref 0.0–0.2)

## 2019-08-05 LAB — CREATININE, SERUM
Creatinine, Ser: 1.81 mg/dL — ABNORMAL HIGH (ref 0.61–1.24)
GFR calc Af Amer: 44 mL/min — ABNORMAL LOW (ref >60)
GFR calc non Af Amer: 38 mL/min — ABNORMAL LOW (ref >60)

## 2019-08-05 LAB — TRIGLYCERIDES: Triglycerides: 101 mg/dL (ref <150)

## 2019-08-05 LAB — MRSA PCR SCREENING: MRSA by PCR: NEGATIVE

## 2019-08-05 MED ORDER — NOREPINEPHRINE 4 MG/250ML-% IV SOLN
0.0000 ug/min | INTRAVENOUS | Status: DC
  Administered 2019-08-05: 9 ug/min via INTRAVENOUS

## 2019-08-05 MED ORDER — SODIUM CHLORIDE 0.9 % IV SOLN
INTRAVENOUS | Status: AC
  Administered 2019-08-05: 03:00:00 via INTRAVENOUS

## 2019-08-05 MED ORDER — PROPOFOL 1000 MG/100ML IV EMUL
5.0000 ug/kg/min | INTRAVENOUS | Status: DC
  Administered 2019-08-05: 35 ug/kg/min via INTRAVENOUS
  Filled 2019-08-05: qty 100

## 2019-08-05 MED ORDER — VASOPRESSIN 20 UNIT/ML IV SOLN
0.0300 [IU]/min | INTRAVENOUS | Status: DC
  Filled 2019-08-05: qty 2

## 2019-08-05 MED ORDER — METOPROLOL TARTRATE 5 MG/5ML IV SOLN
2.0000 mg | INTRAVENOUS | Status: AC | PRN
  Administered 2019-08-06: 2.5 mg via INTRAVENOUS
  Administered 2019-08-07: 5 mg via INTRAVENOUS
  Filled 2019-08-05 (×2): qty 5

## 2019-08-05 MED ORDER — MAGNESIUM SULFATE 2 GM/50ML IV SOLN: 2.0000 g | Freq: Every day | INTRAVENOUS | Status: DC | PRN

## 2019-08-05 MED ORDER — HEPARIN SODIUM (PORCINE) 5000 UNIT/ML IJ SOLN
5000.0000 [IU] | Freq: Three times a day (TID) | INTRAMUSCULAR | Status: AC
  Administered 2019-08-05 (×2): 5000 [IU] via SUBCUTANEOUS
  Filled 2019-08-05 (×2): qty 1

## 2019-08-05 MED ORDER — HEPARIN SODIUM (PORCINE) 5000 UNIT/ML IJ SOLN: 5000.0000 [IU] | Freq: Three times a day (TID) | INTRAMUSCULAR | Status: DC

## 2019-08-05 MED ORDER — CALCIUM CHLORIDE 10 % IV SOLN
INTRAVENOUS | Status: AC
  Filled 2019-08-05: qty 10

## 2019-08-05 MED ORDER — CEFAZOLIN SODIUM 1 G IJ SOLR
INTRAMUSCULAR | Status: AC
  Filled 2019-08-05: qty 20

## 2019-08-05 MED ORDER — HEPARIN SODIUM (PORCINE) 1000 UNIT/ML IJ SOLN
INTRAMUSCULAR | Status: DC | PRN
  Administered 2019-08-04: 6000 [IU] via INTRAVENOUS

## 2019-08-05 MED ORDER — CHLORHEXIDINE GLUCONATE CLOTH 2 % EX PADS
6.0000 | MEDICATED_PAD | Freq: Every day | CUTANEOUS | Status: DC
  Administered 2019-08-05 – 2019-08-07 (×3): 6 via TOPICAL

## 2019-08-05 MED ORDER — SODIUM CHLORIDE 0.9 % IV SOLN: 500.0000 mL | Freq: Once | INTRAVENOUS | Status: DC | PRN

## 2019-08-05 MED ORDER — ACETAMINOPHEN 325 MG PO TABS: 325.0000 mg | ORAL_TABLET | ORAL | Status: DC | PRN

## 2019-08-05 MED ORDER — PANTOPRAZOLE SODIUM 40 MG PO TBEC
40.0000 mg | DELAYED_RELEASE_TABLET | Freq: Every day | ORAL | Status: DC
  Administered 2019-08-05 – 2019-08-07 (×3): 40 mg via ORAL
  Filled 2019-08-05 (×3): qty 1

## 2019-08-05 MED ORDER — ORAL CARE MOUTH RINSE
15.0000 mL | Freq: Two times a day (BID) | OROMUCOSAL | Status: DC
  Administered 2019-08-05 – 2019-08-07 (×3): 15 mL via OROMUCOSAL

## 2019-08-05 MED ORDER — DOCUSATE SODIUM 100 MG PO CAPS
100.0000 mg | ORAL_CAPSULE | Freq: Every day | ORAL | Status: DC
  Administered 2019-08-06 – 2019-08-07 (×2): 100 mg via ORAL
  Filled 2019-08-05 (×2): qty 1

## 2019-08-05 MED ORDER — EPHEDRINE 5 MG/ML INJ
INTRAVENOUS | Status: AC
  Filled 2019-08-05: qty 10

## 2019-08-05 MED ORDER — NOREPINEPHRINE 4 MG/250ML-% IV SOLN
INTRAVENOUS | Status: AC
  Filled 2019-08-05: qty 250

## 2019-08-05 MED ORDER — OXYCODONE-ACETAMINOPHEN 5-325 MG PO TABS
1.0000 | ORAL_TABLET | ORAL | Status: DC | PRN
  Administered 2019-08-05 – 2019-08-06 (×4): 1 via ORAL
  Administered 2019-08-06 – 2019-08-07 (×4): 2 via ORAL
  Filled 2019-08-05 (×2): qty 2
  Filled 2019-08-05: qty 1
  Filled 2019-08-05: qty 2
  Filled 2019-08-05 (×3): qty 1
  Filled 2019-08-05: qty 2

## 2019-08-05 MED ORDER — FAMOTIDINE IN NACL 20-0.9 MG/50ML-% IV SOLN
20.0000 mg | Freq: Two times a day (BID) | INTRAVENOUS | Status: DC
  Administered 2019-08-05 – 2019-08-06 (×4): 20 mg via INTRAVENOUS
  Filled 2019-08-05 (×5): qty 50

## 2019-08-05 MED ORDER — RIVAROXABAN 15 MG PO TABS
15.0000 mg | ORAL_TABLET | Freq: Every day | ORAL | Status: DC
  Administered 2019-08-05 – 2019-08-07 (×3): 15 mg via ORAL
  Filled 2019-08-05 (×4): qty 1

## 2019-08-05 MED ORDER — SUCCINYLCHOLINE CHLORIDE 200 MG/10ML IV SOSY
PREFILLED_SYRINGE | INTRAVENOUS | Status: AC
  Filled 2019-08-05: qty 10

## 2019-08-05 MED ORDER — ONDANSETRON HCL 4 MG/2ML IJ SOLN: 4.0000 mg | Freq: Four times a day (QID) | INTRAMUSCULAR | Status: DC | PRN

## 2019-08-05 MED ORDER — LABETALOL HCL 5 MG/ML IV SOLN: 10.0000 mg | INTRAVENOUS | Status: DC | PRN

## 2019-08-05 MED ORDER — ACETAMINOPHEN 325 MG RE SUPP: 325.0000 mg | RECTAL | Status: DC | PRN

## 2019-08-05 MED ORDER — ALUM & MAG HYDROXIDE-SIMETH 200-200-20 MG/5ML PO SUSP: 15.0000 mL | ORAL | Status: DC | PRN

## 2019-08-05 MED ORDER — VASOPRESSIN 20 UNIT/ML IV SOLN
INTRAVENOUS | Status: AC
  Filled 2019-08-05: qty 1

## 2019-08-05 MED ORDER — POTASSIUM CHLORIDE CRYS ER 20 MEQ PO TBCR: 20.0000 meq | EXTENDED_RELEASE_TABLET | Freq: Every day | ORAL | Status: DC | PRN

## 2019-08-05 MED ORDER — MORPHINE SULFATE (PF) 2 MG/ML IV SOLN
2.0000 mg | INTRAVENOUS | Status: DC | PRN
  Administered 2019-08-05: 2 mg via INTRAVENOUS
  Filled 2019-08-05: qty 1

## 2019-08-05 MED ORDER — HYDRALAZINE HCL 20 MG/ML IJ SOLN: 5.0000 mg | INTRAMUSCULAR | Status: DC | PRN

## 2019-08-05 MED ORDER — GUAIFENESIN-DM 100-10 MG/5ML PO SYRP
15.0000 mL | ORAL_SOLUTION | ORAL | Status: DC | PRN
  Administered 2019-08-06: 15 mL via ORAL
  Filled 2019-08-05: qty 15

## 2019-08-05 MED ORDER — ALBUMIN HUMAN 5 % IV SOLN
INTRAVENOUS | Status: DC | PRN
  Administered 2019-08-05 (×2): via INTRAVENOUS

## 2019-08-05 MED ORDER — ORAL CARE MOUTH RINSE
15.0000 mL | OROMUCOSAL | Status: DC
  Administered 2019-08-05 (×2): 15 mL via OROMUCOSAL

## 2019-08-05 MED ORDER — EPINEPHRINE 1 MG/10ML IJ SOSY
PREFILLED_SYRINGE | INTRAMUSCULAR | Status: DC | PRN
  Administered 2019-08-05: .01 mg via INTRAVENOUS

## 2019-08-05 MED ORDER — CHLORHEXIDINE GLUCONATE 0.12% ORAL RINSE (MEDLINE KIT): 15.0000 mL | Freq: Two times a day (BID) | OROMUCOSAL | Status: DC

## 2019-08-05 MED ORDER — EPINEPHRINE 1 MG/10ML IJ SOSY
PREFILLED_SYRINGE | INTRAMUSCULAR | Status: AC
  Filled 2019-08-05: qty 10

## 2019-08-05 MED ORDER — VASOPRESSIN 20 UNIT/ML IV SOLN
INTRAVENOUS | Status: DC | PRN
  Administered 2019-08-05: 2 [IU] via INTRAVENOUS

## 2019-08-05 MED ORDER — IPRATROPIUM-ALBUTEROL 0.5-2.5 (3) MG/3ML IN SOLN: 3.0000 mL | RESPIRATORY_TRACT | Status: DC | PRN

## 2019-08-05 MED ORDER — PROPOFOL 500 MG/50ML IV EMUL
INTRAVENOUS | Status: DC | PRN
  Administered 2019-08-05: 50 ug/kg/min via INTRAVENOUS

## 2019-08-05 MED ORDER — PHENYLEPHRINE 40 MCG/ML (10ML) SYRINGE FOR IV PUSH (FOR BLOOD PRESSURE SUPPORT)
PREFILLED_SYRINGE | INTRAVENOUS | Status: AC
  Filled 2019-08-05: qty 10

## 2019-08-05 MED ORDER — CLINDAMYCIN PHOSPHATE 300 MG/50ML IV SOLN
300.0000 mg | Freq: Four times a day (QID) | INTRAVENOUS | Status: AC
  Administered 2019-08-05 (×2): 300 mg via INTRAVENOUS
  Filled 2019-08-05 (×2): qty 50

## 2019-08-05 MED ORDER — ROCURONIUM BROMIDE 10 MG/ML (PF) SYRINGE
PREFILLED_SYRINGE | INTRAVENOUS | Status: AC
  Filled 2019-08-05: qty 20

## 2019-08-05 MED ORDER — IPRATROPIUM-ALBUTEROL 0.5-2.5 (3) MG/3ML IN SOLN
3.0000 mL | Freq: Four times a day (QID) | RESPIRATORY_TRACT | Status: DC
  Administered 2019-08-05 (×4): 3 mL via RESPIRATORY_TRACT
  Filled 2019-08-05 (×4): qty 3

## 2019-08-05 MED ORDER — PHENOL 1.4 % MT LIQD: 1.0000 | OROMUCOSAL | Status: DC | PRN

## 2019-08-05 NOTE — Anesthesia Postprocedure Evaluation (Addendum)
Anesthesia Post Note  Patient: Anthony Meadows  Procedure(s) Performed: Carles Collet AND FEM FEM BYPASS (Right Leg Upper)     Patient location during evaluation: PACU Anesthesia Type: General Level of consciousness: sedated and patient remains intubated per anesthesia plan Pain management: pain level controlled Vital Signs Assessment: vitals unstable Respiratory status: patient on ventilator - see flowsheet for VS and patient remains intubated per anesthesia plan Cardiovascular status: stable Anesthetic complications: no    Last Vitals:  Vitals:   08/05/19 0715 08/05/19 0722  BP: (!) 82/68 (!) 82/68  Pulse: 83   Resp: 16   Temp: 37.5 C   SpO2: 100%     Last Pain:  Vitals:   08/05/19 0400  TempSrc: Esophageal  PainSc:                  Nolon Nations

## 2019-08-05 NOTE — Progress Notes (Signed)
[place] Progress Note Patient Name: Anthony Meadows DOB: June 29, 1951 MRN: [id number]   Date of Service  08/05/2019  HPI/Events of Note  Pt needs AM CXR  eICU Interventions  CXR ordered.        [name] 08/05/2019, 4:22 AM

## 2019-08-05 NOTE — Transfer of Care (Signed)
Immediate Anesthesia Transfer of Care Note  Patient: Anthony Meadows  Procedure(s) Performed: Carles Collet AND FEM FEM BYPASS (Right Leg Upper)  Patient Location: ICU  Anesthesia Type:General  Level of Consciousness: Patient remains intubated per anesthesia plan  Airway & Oxygen Therapy: Patient remains intubated per anesthesia plan and Patient placed on Ventilator (see vital sign flow sheet for setting)  Post-op Assessment: Report given to RN and Post -op Vital signs reviewed and stable  Post vital signs: Reviewed and stable  Last Vitals:  Vitals Value Taken Time  BP 111/64 08/05/19 0114  Temp    Pulse 54 08/05/19 0122  Resp 16 08/05/19 0123  SpO2 100 % 08/05/19 0122  Vitals shown include unvalidated device data.  Last Pain:  Vitals:   08/04/19 2000  PainSc: Asleep      Patients Stated Pain Goal: 5 (63/81/77 1165)  Complications: No apparent anesthesia complications

## 2019-08-05 NOTE — Plan of Care (Signed)
  Problem: Education: Goal: Knowledge of prescribed regimen will improve Outcome: Not Applicable   Problem: Activity: Goal: Ability to tolerate increased activity will improve Outcome: Not Applicable   Problem: Bowel/Gastric: Goal: Gastrointestinal status for postoperative course will improve Outcome: Not Applicable   Problem: Clinical Measurements: Goal: Postoperative complications will be avoided or minimized Outcome: Not Applicable Goal: Signs and symptoms of graft occlusion will improve Outcome: Not Applicable   Problem: Skin Integrity: Goal: Demonstration of wound healing without infection will improve Outcome: Not Applicable

## 2019-08-05 NOTE — Progress Notes (Signed)
Paged Dr. Donnetta Hutching about not being able to find bilat pedal or post/ant tibial pulses on pt with the doppler. Bilat femoral & popliteal pulses were found on doppler. Bilat lower extremities cold, cap refill >3sec. Reported that pt's temp is still cold (95.65F, currently on bair hugger) and that he is still on Vaso & Levo. Dr. Donnetta Hutching acknowledged report. When asked if he wanted to do anything about to lack of pulses he said to, "keep warming pt & attempt to wean pressors."

## 2019-08-05 NOTE — Procedures (Signed)
Extubation Procedure Note  Patient Details:   Name: Anthony Meadows DOB: 07/16/1951 MRN: [id number]   Airway Documentation:    Vent end date: 08/05/19 Vent end time: 0732   Evaluation  O2 sats: stable throughout Complications: No apparent complications Patient did tolerate procedure well. Bilateral Breath Sounds: Clear   Yes, pt could speak after extubation.  Pt extubated without difficutly to 2 l/m [place].  Earney Navy 08/05/2019, 7:32 AM

## 2019-08-05 NOTE — Progress Notes (Signed)
[place] Progress Note Patient Name: Anthony Meadows DOB: 31-Mar-1951 MRN: [id number]   Date of Service  08/05/2019  HPI/Events of Note  Pt is s/p revascularization of an ischemic right leg, EBL 1,000 ml, post-op respiratory failure, Pt is on norepinephrine &vasopressin.  eICU Interventions  New patient evaluation completed. PCCM on the ground asked to do a formal consult.        Kerry Kass Keydi Giel 08/05/2019, 1:26 AM

## 2019-08-05 NOTE — Progress Notes (Signed)
[place] Progress Note Patient Name: Anthony Meadows DOB: October 23, 1950 MRN: [id number]   Date of Service  08/05/2019  HPI/Events of Note  ABG result reviewed.  eICU Interventions  FiO2 reduced to 45 %.        [name] 08/05/2019, 2:47 AM

## 2019-08-05 NOTE — Evaluation (Signed)
Physical Therapy Evaluation Patient Details Name: Anthony Meadows MRN: [id number] DOB: [birth year] Today's Date: 08/05/2019   History of Present Illness  Pt is a 68 year old man admitted emergently 08/05/19 with critical L LE ischemia. Underwent thrombectomy of R axilla to femoral bypass and R femoral to popliteal bypass. PMH: R shoulder manipulation 07/29/19, PVD with multiple vascular surgeries, CAD, MI, CABG, ICD, afib, CHF, COPD.   Clinical Impression  Patient presents with decreased independence with mobility due to pain R LE and decreased activity tolerance with SOB sitting EOB on 1L O2, but SpO2 100%.  Patient will benefit from skilled PT in the acute setting to allow return home with family support and likely no follow up PT needs.     Follow Up Recommendations No PT follow up    Equipment Recommendations  None recommended by PT    Recommendations for Other Services       Precautions / Restrictions Precautions Precautions: Fall Restrictions Weight Bearing Restrictions: No      Mobility  Bed Mobility Overal bed mobility: Needs Assistance Bed Mobility: Supine to Sit     Supine to sit: +2 for safety/equipment;Min assist     General bed mobility comments: assist for lines  Transfers Overall transfer level: Needs assistance Equipment used: 1 person hand held assist Transfers: Sit to/from Stand Sit to Stand: Min assist;+2 safety/equipment         General transfer comment: assist for lines  Ambulation/Gait Ambulation/Gait assistance: Min guard;Min assist Gait Distance (Feet): 2 Feet Assistive device: 1 person hand held assist       General Gait Details: marching in place x about 20 sec with HHA, then stepping to recliner  Stairs            Wheelchair Mobility    Modified Rankin (Stroke Patients Only)       Balance Overall balance assessment: Mild deficits observed, not formally tested;Needs assistance                                           Pertinent Vitals/Pain Pain Assessment: 0-10 Pain Score: 4  Pain Location: R LE Pain Descriptors / Indicators: Sore;Aching Pain Intervention(s): Monitored during session;Repositioned    Home Living Family/patient expects to be discharged to:: Private residence Living Arrangements: Spouse/significant other Available Help at Discharge: Family;Available 24 hours/day Type of Home: House Home Access: Stairs to enter Entrance Stairs-Rails: Right Entrance Stairs-Number of Steps: 8 Home Layout: One level Home Equipment: None      Prior Function Level of Independence: Independent               Hand Dominance   Dominant Hand: Right    Extremity/Trunk Assessment   Upper Extremity Assessment Upper Extremity Assessment: Defer to OT evaluation    Lower Extremity Assessment Lower Extremity Assessment: RLE deficits/detail RLE Deficits / Details: lifts and moves WFL, but painful RLE Sensation: decreased light touch(below knee)    Cervical / Trunk Assessment Cervical / Trunk Assessment: Normal  Communication   Communication: No difficulties  Cognition Arousal/Alertness: Awake/alert Behavior During Therapy: WFL for tasks assessed/performed Overall Cognitive Status: Impaired/Different from baseline Area of Impairment: Memory                               General Comments: pt with difficulty providing home set up and recent medical  hx, wife assisting      General Comments General comments (skin integrity, edema, etc.): wife in the room and supportive, patient with VSS on 1L O2, but c/o SOB, educated in pursed lip breathing,  Set up lunch tray after session    Exercises     Assessment/Plan    PT Assessment Patient needs continued PT services  PT Problem List Pain;Cardiopulmonary status limiting activity;Decreased activity tolerance;Decreased mobility;Decreased balance;Decreased knowledge of use of DME       PT Treatment Interventions DME  instruction;Stair training;Therapeutic activities;Balance training;Patient/family education;Gait training;Functional mobility training;Therapeutic exercise    PT Goals (Current goals can be found in the Care Plan section)  Acute Rehab PT Goals Patient Stated Goal: to go home PT Goal Formulation: With patient/family Time For Goal Achievement: 08/19/19 Potential to Achieve Goals: Good    Frequency Min 3X/week   Barriers to discharge        Co-evaluation               AM-PAC PT "6 Clicks" Mobility  Outcome Measure Help needed turning from your back to your side while in a flat bed without using bedrails?: None Help needed moving from lying on your back to sitting on the side of a flat bed without using bedrails?: None Help needed moving to and from a bed to a chair (including a wheelchair)?: A Little Help needed standing up from a chair using your arms (e.g., wheelchair or bedside chair)?: A Little Help needed to walk in hospital room?: A Little Help needed climbing 3-5 steps with a railing? : A Little 6 Click Score: 20    End of Session   Activity Tolerance: Patient tolerated treatment well Patient left: in chair;with call bell/phone within reach;with family/visitor present Nurse Communication: Mobility status PT Visit Diagnosis: Other abnormalities of gait and mobility (R26.89)    Time: 6314-9702 PT Time Calculation (min) (ACUTE ONLY): 15 min   Charges:   PT Evaluation $PT Eval Low Complexity: 1 Low          Sheran Lawless, [place] [facility] [phone] 08/05/2019   [name] 08/05/2019, 3:52 PM

## 2019-08-05 NOTE — Progress Notes (Signed)
Patient ID: Anthony Meadows, male   DOB: [birth year], 68 y.o.   MRN: [id number]  Progress Note    08/05/2019 7:41 AM 1 Day Post-Op  Subjective: Awake and somewhat agitated on the vent.  Moving all 4 extremities   Vitals:   08/05/19 0715 08/05/19 0722  BP: (!) 82/68 (!) 82/68  Pulse: 83   Resp: 16   Temp: 99.5 F (37.5 C)   SpO2: 100%    Physical Exam: Right groin without hematoma or bleeding.  Monophasic dorsalis pedis signals bilaterally  CBC    Component Value Date/Time   WBC 19.6 (H) 08/05/2019 0213   RBC 2.93 (L) 08/05/2019 0213   HGB 9.9 (L) 08/05/2019 0219   HCT 29.0 (L) 08/05/2019 0219   PLT 188 08/05/2019 0213   MCV 103.8 (H) 08/05/2019 0213   MCH 33.4 08/05/2019 0213   MCHC 32.2 08/05/2019 0213   RDW 13.7 08/05/2019 0213   LYMPHSABS 2.2 06/09/2016 1150   MONOABS 0.8 06/09/2016 1150   EOSABS 0.5 06/09/2016 1150   BASOSABS 0.0 06/09/2016 1150    BMET    Component Value Date/Time   NA 140 08/05/2019 0219   K 4.4 08/05/2019 0219   CL 102 08/04/2019 1840   CO2 21 (L) 08/04/2019 1840   GLUCOSE 158 (H) 08/04/2019 1840   BUN 38 (H) 08/04/2019 1840   CREATININE 1.81 (H) 08/05/2019 0213   CALCIUM 9.2 08/04/2019 1840   GFRNONAA 38 (L) 08/05/2019 0213   GFRAA 44 (L) 08/05/2019 0213    INR    Component Value Date/Time   INR 1.7 (H) 08/04/2019 1840     Intake/Output Summary (Last 24 hours) at 08/05/2019 0741 Last data filed at 08/05/2019 0700 Gross per 24 hour  Intake 2049.76 ml  Output 1460 ml  Net 589.76 ml     Assessment/Plan:  68 y.o. male stable status post thrombectomy of right axilla bypass.  Multiple medical issues with significant cardiac and pulmonary dysfunction.  Appreciate critical care's assistance with vent management.     Rosetta Posner, MD FACS [facility] [phone] 08/05/2019 7:41 AM

## 2019-08-05 NOTE — Op Note (Signed)
° ° °  OPERATIVE REPORT  DATE OF SURGERY: 08/05/2019  PATIENT: Anthony Meadows, 68 y.o. male MRN: [id number]  DOB: 03/10/1951  PRE-OPERATIVE DIAGNOSIS: Critical limb ischemia right lower extremity  POST-OPERATIVE DIAGNOSIS:  Same  PROCEDURE: Right groin exploration, thrombectomy of right axillary to femoral bypass and right femoral to popliteal bypass  SURGEON:  Curt Jews, M.D.  PHYSICIAN ASSISTANT: Matt Eveland, PA-C  ANESTHESIA: General  EBL: per anesthesia record  Total I/O In: 1500 [I.V.:1000; IV Piggyback:500] Out: 900 [Urine:150; Blood:750]  BLOOD ADMINISTERED: none  DRAINS: none  SPECIMEN: none  COUNTS CORRECT:  YES  PATIENT DISPOSITION:  PACU - hemodynamically stable  PROCEDURE DETAILS: Patient was taken up and placed in supine position where the area of the right chest right groin left groin and bilateral lower extremities were prepped and draped in usual sterile fashion.  An incision was made through the prior scar in the right groin carried down to isolate the femoral limb of the axillary to femoral bypass and also the right limb of the left to right femorofemoral bypass.  The vein femoral to popliteal bypass was also isolated and encircled with Vesseloops.  Longitudinal incision was made over the hood of the Dacron femorofemoral limb.  There was thrombus present this was removed with DeBakey pickups.  A 4 Fogarty catheter was passed down through the vein femoral-popliteal bypass.  Clot was removed and backbleeding.  When no further clot was removed flushed with heparinized saline and occluded.  Femoral artery was seen there was landing from this as well.  This was reoccluded.  Catheter was attempted to pass and the femoral-femoral bypass and this was chronically occluded.  Finally the fourth regular catheter was passed up the's and the axillary femoral bypass was thrombectomized.  The arterialized plug at the arterial subclavian anastomosis with and there was excellent  inflow.  This was reoccluded.  The patient had been given systemic heparin.  The wounds were irrigated.  The incision in the graft was closed running 5-0 Prolene suture.  Removed and excellent flow was noted in the deep femoral and femoropopliteal bypass.  Patient was somewhat hypotensive with a pressure in the 90-100 on vasopressors.  No audible flow was noted in the left or right foot due to this.  The patient was not given protamine.  The wounds were irrigated with saline.  Hemostasis electrocautery.  The wounds were closed with 2-0 Vicryl and the subcutaneous tissue in several layers.  The skin was closed with 3-0 subcuticular Vicryl stitch.  Sterile dressing was applied and the patient was transferred to the recovery room in stable condition   Rosetta Posner, M.D., [facility] 08/05/2019 12:49 AM

## 2019-08-05 NOTE — Evaluation (Signed)
Occupational Therapy Evaluation Patient Details Name: Anthony Meadows MRN: [id number] DOB: 1951-10-20 Today's Date: 08/05/2019    History of Present Illness Pt is a 68 year old man admitted emergently 08/05/19 with critical L LE ischemia. Underwent thrombectomy of R axilla to femoral bypass and R femoral to popliteal bypass. PMH: R shoulder manipulation 07/29/19, PVD with multiple vascular surgeries, CAD, MI, CABG, ICD, afib, CHF, COPD.    Clinical Impression   Pt is typically independent. He has been going to OPPT for R shoulder adhesive capsulitis and is knowledgeable in his home exercise program. He is currently functioning at set up to min guard assist level in ADL and transfers. Pt is likely to progress well. No further OT needs.   Follow Up Recommendations  No OT follow up(resume OPPT for R shoulder)    Equipment Recommendations  3 in 1 bedside commode(pt currently declining 3 in 1)    Recommendations for Other Services       Precautions / Restrictions Precautions Precautions: Fall Restrictions Weight Bearing Restrictions: No      Mobility Bed Mobility               General bed mobility comments: pt in chair  Transfers Overall transfer level: Needs assistance Equipment used: None Transfers: Sit to/from Stand Sit to Stand: Min guard         General transfer comment: for safety and lines    Balance Overall balance assessment: Mild deficits observed, not formally tested                                         ADL either performed or assessed with clinical judgement   ADL                                         General ADL Comments: pt is overall functioning at  supervision level     Vision Patient Visual Report: No change from baseline       Perception     Praxis      Pertinent Vitals/Pain Pain Assessment: 0-10 Pain Score: 2  Pain Location: R LE Pain Descriptors / Indicators: Sore;Aching Pain  Intervention(s): Monitored during session;Repositioned     Hand Dominance Right   Extremity/Trunk Assessment Upper Extremity Assessment Upper Extremity Assessment: Overall WFL for tasks assessed(pt is independent in his HEP for R shoulder)   Lower Extremity Assessment Lower Extremity Assessment: Defer to PT evaluation   Cervical / Trunk Assessment Cervical / Trunk Assessment: Normal   Communication Communication Communication: No difficulties   Cognition Arousal/Alertness: Awake/alert Behavior During Therapy: WFL for tasks assessed/performed Overall Cognitive Status: Impaired/Different from baseline Area of Impairment: Memory                               General Comments: pt with difficulty providing home set up and recent medical hx, wife assisting   General Comments       Exercises     Shoulder Instructions      Home Living Family/patient expects to be discharged to:: Private residence Living Arrangements: Spouse/significant other Available Help at Discharge: Family;Available 24 hours/day Type of Home: House Home Access: Stairs to enter [employer] of Steps: 5   Home Layout: One level  Bathroom Shower/Tub: Producer, television/film/video: Standard     Home Equipment: None          Prior Functioning/Environment Level of Independence: Independent                 OT Problem List:        OT Treatment/Interventions:      OT Goals(Current goals can be found in the care plan section) Acute Rehab OT Goals Patient Stated Goal: to go home  OT Frequency:     Barriers to D/C:            Co-evaluation              AM-PAC OT "6 Clicks" Daily Activity     Outcome Measure Help from another person eating meals?: None Help from another person taking care of personal grooming?: A Little Help from another person toileting, which includes using toliet, bedpan, or urinal?: A Little Help from another person bathing  (including washing, rinsing, drying)?: A Little Help from another person to put on and taking off regular upper body clothing?: None Help from another person to put on and taking off regular lower body clothing?: A Little 6 Click Score: 20   End of Session    Activity Tolerance: Patient tolerated treatment well Patient left: in chair;with call bell/phone within reach;with family/visitor present  OT Visit Diagnosis: Pain                Time: 9983-3825 OT Time Calculation (min): 17 min Charges:  OT General Charges $OT Visit: 1 Visit OT Evaluation $OT Eval Moderate Complexity: 1 Mod  Martie Round, OTR/L Acute Rehabilitation Services Pager: [phone] Office: [phone]  Evern Bio 08/05/2019, 2:56 PM

## 2019-08-05 NOTE — Consult Note (Signed)
NAME:  Anthony Meadows, MRN:  [id number], DOB:  03/05/1951, LOS: 1 ADMISSION DATE:  08/04/2019, CONSULTATION DATE: 08/05/2019 REFERRING MD: Early, CHIEF COMPLAINT: Status post thrombectomy  Brief History   Patient is a 68 year old seen postoperative for right lower extremity thrombectomy  History of present illness   Patient is a 68 year old male seen in ICU status post thrombectomy for right lower extremity occlusion.  Patient has a history of multiple procedures over the last 10 years for revascularization of the lower extremities.  She is also had a quadruple bypass in the last year along with mitral valve clip, along with a history of severe ischemic cardiomyopathy with ICD placement.  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 On my evaluation the patient is sedated stable vital signs stable Levophed has been held with a systolic of 110.  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 rate 15, height 5\' 7"  (1.702 m), weight 72.6 kg, SpO2 100 %.    Vent Mode: PRVC FiO2 (%):  [60 %] 60 % Set Rate:  [16 bmp] 16 bmp Vt Set:  [620 mL] 620 mL PEEP:  [5 cmH20] 5 cmH20 Plateau Pressure:  [14 cmH20] 14 cmH20   Intake/Output Summary (Last 24 hours) at 08/05/2019 0131 Last  data filed at 08/05/2019 0046 Gross per 24 hour  Intake 1500 ml  Output 900 ml  Net 600 ml   Filed Weights   08/04/19 1800  Weight: 72.6 kg    Examination: General: Well-developed white male sedated on the ventilator HENT: Within normal limits Lungs: Clear Cardiovascular: Regular Abdomen: Benign soft bowel sounds absent Extremities: Lower extremity pulses popliteal by Doppler Neuro: Sedated GU: Normal  Resolved Hospital Problem list   NA  Assessment & Plan:  1.  Status post thrombectomy: We will assist with critical care issues while ventilated in the ICU including ventilator management and sedation  2.  history of severe cardiovascular disease: Will need to monitor I's and O's, weight closely along with development of any new arrhythmias.  3. Type 2 diabetes mellitus: We will monitor  4.  Leave patient n.p.o. for now for reevaluation possible extubation in the morning.    Best practice:  Diet: NPO Pain/Anxiety/Delirium protocol (if indicated):propofol VAP protocol (if indicated): yes DVT prophylaxis: full dose anticoagulation GI prophylaxis: pepcid Glucose control: Monitor Mobility: bedrest Code Status: full Family Communication: not available Disposition: ICU care  Labs   CBC: Recent Labs  Lab 08/04/19 1840  WBC 12.5*  HGB 15.1  HCT 44.0  MCV 100.9*  PLT 235    Basic Metabolic Panel: Recent Labs  Lab 08/04/19 1840  NA 139  K 4.1  CL 102  CO2 21*  GLUCOSE 158*  BUN 38*  CREATININE 2.03*  CALCIUM 9.2   GFR: Estimated Creatinine Clearance: 33.5 mL/min (A) (by C-G formula based on SCr of 2.03 mg/dL (H)). Recent Labs  Lab 08/04/19 1840  WBC 12.5*    Liver Function Tests: No results for input(s): AST, ALT, ALKPHOS, BILITOT, PROT, ALBUMIN in the last 168 hours. No results for input(s): LIPASE, AMYLASE in the last 168 hours. No results for input(s): AMMONIA in the last 168 hours.  ABG No results found for: PHART, PCO2ART, PO2ART, HCO3,  TCO2, ACIDBASEDEF, O2SAT   Coagulation Profile: Recent Labs  Lab 08/04/19 1840  INR 1.7*    Cardiac Enzymes: No results for input(s): CKTOTAL, CKMB, CKMBINDEX, TROPONINI in the last 168 hours.  HbA1C: Hgb A1c MFr Bld  Date/Time Value Ref Range Status  08/04/2019 06:40 PM 7.3 (H) 4.8 - 5.6 % Final    Comment:    (NOTE) Pre diabetes:          5.7%-6.4% Diabetes:              >6.4% Glycemic control for   <7.0% adults with diabetes   06/18/2016 02:21 PM 5.7 (H) 4.8 - 5.6 % Final    Comment:    (NOTE)         Pre-diabetes: 5.7 - 6.4         Diabetes: >6.4         Glycemic control for adults with diabetes: <7.0     CBG: No results for input(s): GLUCAP in the last 168 hours.  Review of Systems:   Unable to obtain  Past Medical History  He,  has a past medical history of AICD (automatic cardioverter/defibrillator) present, Anxiety, Atrial fibrillation with rapid ventricular response ([place]), CHF (congestive heart failure) ([place]), Complication of anesthesia, COPD (chronic obstructive pulmonary disease) ([place]), Coronary artery disease, Dysrhythmia, Hypercholesterolemia, Hypertension, Ischemic cardiomyopathy, Myocardial infarction ([place]) (2002), On home oxygen therapy, Pre-diabetes, and PVD (peripheral vascular disease) ([place]).   Surgical History    Past Surgical History:  Procedure Laterality Date  . ANGIOPLASTY / STENTING FEMORAL Right ~2012  . CARDIAC CATHETERIZATION  2002  . CARDIAC DEFIBRILLATOR PLACEMENT  01/2008   [employer] ; [employer]oso VR defibrillator; Model [id number]; Serial [id number]  . CORONARY ANGIOPLASTY    . CORONARY ARTERY BYPASS GRAFT  2002   'CABG X5"  . FEMORAL-FEMORAL BYPASS GRAFT Bilateral 08/16/2016   Procedure: BYPASS GRAFT FEMORAL-FEMORAL ARTERY USING 8MM X 30CM HEMASHIELD GRAFT;  Surgeon: Rosetta Posner, MD;  Location: [facility];  Service: Vascular;  Laterality: Bilateral;  . PATCH ANGIOPLASTY Right 04/12/2015   Procedure: PATCH ANGIOPLASTY Right Femoral Artery;   Surgeon: Rosetta Posner, MD;  Location: [place];  Service: Vascular;  Laterality: Right;  . THROMBECTOMY FEMORAL ARTERY Right 04/12/2015   Procedure: THROMBECTOMY Right Axilla-Femoral Gortex Graft;  Surgeon: Rosetta Posner, MD;  Location: [facility];  Service: Vascular;  Laterality: Right;  . THROMBECTOMY FEMORAL ARTERY Right 06/21/2016   Procedure: RIGHT AXILLARY-FEMORAL THROMBECTOMY;  Surgeon: Rosetta Posner, MD;  Location: [place];  Service: Vascular;  Laterality: Right;  . THROMBECTOMY FEMORAL ARTERY Right 08/16/2016   Procedure: THROMBECTOMY AXILLOFEMORAL BYPASS GRAFT;  Surgeon: Rosetta Posner, MD;  [facility];  Service: Vascular;  Laterality: Right;     Social History   reports that he quit smoking about 3 years ago. His smoking use included cigarettes. He has a 156.00 pack-year smoking history. He has never used smokeless tobacco. He reports that he does not drink alcohol or use drugs.   Family History   His family history is not on file.   Allergies Allergies  Allergen Reactions  . Amiodarone Other (See Comments)    Amio-induced hyperthyroidism    .  Metformin Diarrhea and Nausea And Vomiting  . Metformin And Related Diarrhea and Nausea And Vomiting  . Penicillins Hives and Swelling    Did it involve swelling of the face/tongue/throat, SOB, or low BP? Yes Did it involve sudden or severe rash/hives, skin peeling, or any reaction on the inside of your mouth or nose? No Did you need to seek medical attention at a hospital or doctor's office? No When did it last happen? More than 10 years ago If all above answers are "NO", may proceed with cephalosporin use.     Home Medications  Prior to Admission medications   Medication Sig Start Date End Date Taking? Authorizing Provider  albuterol (PROVENTIL HFA;VENTOLIN HFA) 108 (90 Base) MCG/ACT inhaler Inhale 2 puffs into the lungs every 6 (six) hours as needed for wheezing or shortness of breath.    Yes [provider]  atorvastatin  (LIPITOR) 80 MG tablet Take 80 mg by mouth at bedtime.    Yes [provider]  Cholecalciferol (VITAMIN D3) 10000 units TABS Take 10,000 Units by mouth daily after breakfast.    Yes [provider]  LORazepam (ATIVAN) 0.5 MG tablet Take 0.5 mg by mouth 2 (two) times daily.  05/08/16  Yes [provider]  metoprolol succinate (TOPROL-XL) 50 MG 24 hr tablet Take 50 mg by mouth daily. 05/02/19  Yes [provider]  Rivaroxaban (XARELTO) 15 MG TABS tablet Take 1 tablet (15 mg total) by mouth daily with supper. 08/18/16  Yes Early, Kristen Loader, MD  sacubitril-valsartan (ENTRESTO) 49-51 MG Take 1 tablet by mouth 2 (two) times daily.   Yes [provider]  torsemide (DEMADEX) 20 MG tablet Take 20 mg by mouth daily.    Yes [provider]  HYDROcodone-acetaminophen (NORCO/VICODIN) 5-325 MG tablet Take 1-2 tablets by mouth every 4 (four) hours as needed for moderate pain. Patient not taking: Reported on 06/04/2019 08/16/16   Raymond Gurney, PA-C     Critical care time: Over 35 minutes was spent in bedside evaluation chart review and critical care planning

## 2019-08-05 NOTE — Progress Notes (Signed)
Seen examined, reaching for tube, ext warm. On propofol and levophed. Fine to do SAT/SBT/extubate.  Erskine Emery MD

## 2019-08-05 NOTE — Progress Notes (Signed)
[facility] Progress Note Patient Name: Anthony Meadows DOB: 11-29-1950 MRN: [id number]   Date of Service  08/05/2019  HPI/Events of Note  Pt needs renewal of Norepinephrine and Vasopressin orders.  eICU Interventions  Orders renewed.        Kerry Kass Secret Kristensen 08/05/2019, 6:17 AM

## 2019-08-06 LAB — BASIC METABOLIC PANEL
Anion gap: 8 (ref 5–15)
BUN: 29 mg/dL — ABNORMAL HIGH (ref 8–23)
CO2: 23 mmol/L (ref 22–32)
Calcium: 7.7 mg/dL — ABNORMAL LOW (ref 8.9–10.3)
Chloride: 108 mmol/L (ref 98–111)
Creatinine, Ser: 1.61 mg/dL — ABNORMAL HIGH (ref 0.61–1.24)
GFR calc Af Amer: 51 mL/min — ABNORMAL LOW (ref >60)
GFR calc non Af Amer: 44 mL/min — ABNORMAL LOW (ref >60)
Glucose, Bld: 131 mg/dL — ABNORMAL HIGH (ref 70–99)
Potassium: 3.6 mmol/L (ref 3.5–5.1)
Sodium: 139 mmol/L (ref 135–145)

## 2019-08-06 LAB — CBC
HCT: 27.4 % — ABNORMAL LOW (ref 39.0–52.0)
Hemoglobin: 8.8 g/dL — ABNORMAL LOW (ref 13.0–17.0)
MCH: 33.8 pg (ref 26.0–34.0)
MCHC: 32.1 g/dL (ref 30.0–36.0)
MCV: 105.4 fL — ABNORMAL HIGH (ref 80.0–100.0)
Platelets: 122 10*3/uL — ABNORMAL LOW (ref 150–400)
RBC: 2.6 MIL/uL — ABNORMAL LOW (ref 4.22–5.81)
RDW: 13.7 % (ref 11.5–15.5)
WBC: 9.5 10*3/uL (ref 4.0–10.5)
nRBC: 0 % (ref 0.0–0.2)

## 2019-08-06 LAB — GLUCOSE, CAPILLARY
Glucose-Capillary: 208 mg/dL — ABNORMAL HIGH (ref 70–99)
Glucose-Capillary: 135 mg/dL — ABNORMAL HIGH (ref 70–99)
Glucose-Capillary: 125 mg/dL — ABNORMAL HIGH (ref 70–99)
Glucose-Capillary: 178 mg/dL — ABNORMAL HIGH (ref 70–99)
Glucose-Capillary: 155 mg/dL — ABNORMAL HIGH (ref 70–99)
Glucose-Capillary: 184 mg/dL — ABNORMAL HIGH (ref 70–99)

## 2019-08-06 NOTE — Progress Notes (Signed)
Doing fine off vent Call if we can be of further assistance.

## 2019-08-06 NOTE — Progress Notes (Signed)
Physical Therapy Treatment Patient Details Name: Anthony Meadows MRN: [id number] DOB: 1951/02/06 Today's Date: 08/06/2019    History of Present Illness Pt is a 68 year old man admitted emergently 08/05/19 with critical L LE ischemia. Underwent thrombectomy of R axilla to femoral bypass and R femoral to popliteal bypass. PMH: R shoulder manipulation 07/29/19, PVD with multiple vascular surgeries, CAD, MI, CABG, ICD, afib, CHF, COPD.     PT Comments    Patient progressing to hallway ambulation and able to demonstrate safety with transfers in the room.  Seems to have possibly some baseline deficits with memory, but jokes and compensates.  Wife not here today yet, but patient should be safe for d/c home without PT follow up.  Will plan to check once more if remains inpatient to practice stairs for home entry.    Follow Up Recommendations  No PT follow up     Equipment Recommendations  None recommended by PT    Recommendations for Other Services       Precautions / Restrictions Precautions Precautions: Fall Restrictions Weight Bearing Restrictions: No    Mobility  Bed Mobility               General bed mobility comments: up in chair with RN  Transfers     Transfers: Sit to/from Stand Sit to Stand: Modified independent (Device/Increase time)            Ambulation/Gait Ambulation/Gait assistance: Supervision Gait Distance (Feet): 350 Feet Assistive device: None Gait Pattern/deviations: Step-through pattern;Antalgic;Decreased stance time - right     General Gait Details: mild antalgia noted with ambulation, but mobilizing well   Stairs             Wheelchair Mobility    Modified Rankin (Stroke Patients Only)       Balance Overall balance assessment: Mild deficits observed, not formally tested                                          Cognition Arousal/Alertness: Awake/alert Behavior During Therapy: WFL for tasks  assessed/performed Overall Cognitive Status: Impaired/Different from baseline Area of Impairment: Safety/judgement                         Safety/Judgement: Decreased awareness of safety;Decreased awareness of deficits     General Comments: unaware of room number, cues for safety with needing to stay inpatient another day and not plan to return to smoking, etc at d/c.      Exercises      General Comments General comments (skin integrity, edema, etc.): on RA SpO2 dropping to high 70's in hallway, but back to 90's with stopping and pursed lip breathing, but pt with PVD so could be positional;  HR up to 140's with ambulation and RN gave medicaiton post ambulation      Pertinent Vitals/Pain Pain Assessment: Faces Faces Pain Scale: Hurts little more Pain Location: R LE Pain Descriptors / Indicators: Sore Pain Intervention(s): Monitored during session;Repositioned    Home Living                      Prior Function            PT Goals (current goals can now be found in the care plan section) Progress towards PT goals: Progressing toward goals    Frequency    Min  3X/week      PT Plan Current plan remains appropriate    Co-evaluation              AM-PAC PT "6 Clicks" Mobility   Outcome Measure  Help needed turning from your back to your side while in a flat bed without using bedrails?: None Help needed moving from lying on your back to sitting on the side of a flat bed without using bedrails?: None Help needed moving to and from a bed to a chair (including a wheelchair)?: None Help needed standing up from a chair using your arms (e.g., wheelchair or bedside chair)?: None Help needed to walk in hospital room?: None Help needed climbing 3-5 steps with a railing? : A Little 6 Click Score: 23    End of Session   Activity Tolerance: Patient tolerated treatment well Patient left: in chair;with call bell/phone within reach   PT Visit Diagnosis:  Other abnormalities of gait and mobility (R26.89)     Time: 2035-5974 PT Time Calculation (min) (ACUTE ONLY): 22 min  Charges:  $Gait Training: 8-22 mins                     Anthony Meadows, [place] [phone] 08/06/2019    Anthony Meadows 08/06/2019, 10:52 AM

## 2019-08-06 NOTE — Progress Notes (Signed)
    Subjective  - POD #2  Extubated yesterday Ambulating without assistance   Physical Exam:  Incisions c/d/i + DP doppler signals Compartments soft     Assessment/Plan:  POD #2  Transfer to floor [place] for anticoagulation D/c central line Anticipate d/c tomorrow  Annamarie Major 08/06/2019 10:27 AM --  Vitals:   08/06/19 0700 08/06/19 0800  BP: 123/61 122/70  Pulse: 73 (!) 106  Resp: (!) 21 18  Temp: 98.1 F (36.7 C)   SpO2: 100% 96%    Intake/Output Summary (Last 24 hours) at 08/06/2019 1027 Last data filed at 08/06/2019 0800 Gross per 24 hour  Intake 1286.16 ml  Output 1375 ml  Net -88.84 ml     Laboratory CBC    Component Value Date/Time   WBC 9.5 08/06/2019 0424   HGB 8.8 (L) 08/06/2019 0424   HCT 27.4 (L) 08/06/2019 0424   PLT 122 (L) 08/06/2019 0424    BMET    Component Value Date/Time   NA 139 08/06/2019 0424   K 3.6 08/06/2019 0424   CL 108 08/06/2019 0424   CO2 23 08/06/2019 0424   GLUCOSE 131 (H) 08/06/2019 0424   BUN 29 (H) 08/06/2019 0424   CREATININE 1.61 (H) 08/06/2019 0424   CALCIUM 7.7 (L) 08/06/2019 0424   GFRNONAA 44 (L) 08/06/2019 0424   GFRAA 51 (L) 08/06/2019 0424    COAG Lab Results  Component Value Date   INR 1.7 (H) 08/04/2019   INR 1.57 08/14/2016   INR 1.09 06/18/2016   No results found for: PTT  Antibiotics Anti-infectives (From admission, onward)   Start     Dose/Rate Route Frequency Ordered Stop   08/05/19 0200  clindamycin (CLEOCIN) IVPB 300 mg     300 mg 100 mL/hr over 30 Minutes Intravenous Every 6 hours 08/05/19 0148 08/05/19 0528       V. Leia Alf, M.D., [facility] [place] Office: [phone] Pager:  [phone]

## 2019-08-07 ENCOUNTER — Encounter (HOSPITAL_COMMUNITY): Payer: Self-pay | Admitting: Internal Medicine

## 2019-08-07 DIAGNOSIS — I4819 Other persistent atrial fibrillation: Secondary | ICD-10-CM

## 2019-08-07 LAB — GLUCOSE, CAPILLARY
Glucose-Capillary: 190 mg/dL — ABNORMAL HIGH (ref 70–99)
Glucose-Capillary: 146 mg/dL — ABNORMAL HIGH (ref 70–99)
Glucose-Capillary: 196 mg/dL — ABNORMAL HIGH (ref 70–99)

## 2019-08-07 LAB — CBC
HCT: 28.1 % — ABNORMAL LOW (ref 39.0–52.0)
Hemoglobin: 9.1 g/dL — ABNORMAL LOW (ref 13.0–17.0)
MCH: 33.5 pg (ref 26.0–34.0)
MCHC: 32.4 g/dL (ref 30.0–36.0)
MCV: 103.3 fL — ABNORMAL HIGH (ref 80.0–100.0)
Platelets: 134 10*3/uL — ABNORMAL LOW (ref 150–400)
RBC: 2.72 MIL/uL — ABNORMAL LOW (ref 4.22–5.81)
RDW: 14 % (ref 11.5–15.5)
WBC: 9.4 10*3/uL (ref 4.0–10.5)
nRBC: 0.2 % (ref 0.0–0.2)

## 2019-08-07 MED ORDER — INFLUENZA VAC A&B SA ADJ QUAD 0.5 ML IM PRSY
0.5000 mL | PREFILLED_SYRINGE | INTRAMUSCULAR | Status: AC
  Administered 2019-08-07: 0.5 mL via INTRAMUSCULAR
  Filled 2019-08-07: qty 0.5

## 2019-08-07 MED ORDER — INFLUENZA VAC A&B SA ADJ QUAD 0.5 ML IM PRSY: 0.5000 mL | PREFILLED_SYRINGE | INTRAMUSCULAR | Status: DC

## 2019-08-07 MED ORDER — FAMOTIDINE 20 MG PO TABS
20.0000 mg | ORAL_TABLET | Freq: Every day | ORAL | Status: DC
  Administered 2019-08-07: 20 mg via ORAL
  Filled 2019-08-07: qty 1

## 2019-08-07 MED ORDER — HYDROCODONE-ACETAMINOPHEN 5-325 MG PO TABS: 1.0000 | ORAL_TABLET | Freq: Four times a day (QID) | ORAL | 0 refills | Status: DC | PRN

## 2019-08-07 NOTE — Progress Notes (Signed)
Physical Therapy Treatment Patient Details Name: Anthony Meadows MRN: [id number] DOB: 1951/07/08 Today's Date: 08/07/2019    History of Present Illness Pt is a 68 year old man admitted emergently 08/05/19 with critical L LE ischemia. Underwent thrombectomy of R axilla to femoral bypass and R femoral to popliteal bypass. PMH: R shoulder manipulation 07/29/19, PVD with multiple vascular surgeries, CAD, MI, CABG, ICD, afib, CHF, COPD.     PT Comments    Excellent mobility progress. Supervision ambulation 300 feet without AD. Stair training complete, supervision 1 flight with R rail. Plan is for d/c home today. No follow up PT services indicated.   Follow Up Recommendations  No PT follow up     Equipment Recommendations  None recommended by PT    Recommendations for Other Services       Precautions / Restrictions Precautions Precautions: None Restrictions Weight Bearing Restrictions: No    Mobility  Bed Mobility Overal bed mobility: Modified Independent                Transfers Overall transfer level: Modified independent                  Ambulation/Gait Ambulation/Gait assistance: Supervision Gait Distance (Feet): 300 Feet Assistive device: None Gait Pattern/deviations: Step-through pattern Gait velocity: WFL Gait velocity interpretation: >2.62 ft/sec, indicative of community ambulatory General Gait Details: steady gait   Stairs Stairs: Yes Stairs assistance: Supervision Stair Management: One rail Right;Alternating pattern;Forwards Number of Stairs: 12     Wheelchair Mobility    Modified Rankin (Stroke Patients Only)       Balance Overall balance assessment: Mild deficits observed, not formally tested                                          Cognition Arousal/Alertness: Awake/alert Behavior During Therapy: WFL for tasks assessed/performed Overall Cognitive Status: Within Functional Limits for tasks assessed                                         Exercises      General Comments General comments (skin integrity, edema, etc.): Pt on RA during session. Tachy with mobility. max HR 140s.      Pertinent Vitals/Pain Pain Assessment: Faces Faces Pain Scale: Hurts even more Pain Location: R shoulder Pain Descriptors / Indicators: Aching;Grimacing Pain Intervention(s): Monitored during session;Repositioned    Home Living                      Prior Function            PT Goals (current goals can now be found in the care plan section) Acute Rehab PT Goals Patient Stated Goal: to go home PT Goal Formulation: With patient/family Time For Goal Achievement: 08/19/19 Potential to Achieve Goals: Good Progress towards PT goals: Progressing toward goals    Frequency    Min 3X/week      PT Plan Current plan remains appropriate    Co-evaluation              AM-PAC PT "6 Clicks" Mobility   Outcome Measure  Help needed turning from your back to your side while in a flat bed without using bedrails?: None Help needed moving from lying on your back to sitting on the side  of a flat bed without using bedrails?: None Help needed moving to and from a bed to a chair (including a wheelchair)?: None Help needed standing up from a chair using your arms (e.g., wheelchair or bedside chair)?: None Help needed to walk in hospital room?: None Help needed climbing 3-5 steps with a railing? : A Little 6 Click Score: 23    End of Session Equipment Utilized During Treatment: Gait belt Activity Tolerance: Patient tolerated treatment well Patient left: in bed;with call bell/phone within reach Nurse Communication: Mobility status PT Visit Diagnosis: Other abnormalities of gait and mobility (R26.89)     Time: 9983-3825 PT Time Calculation (min) (ACUTE ONLY): 11 min  Charges:  $Gait Training: 8-22 mins                     Lorrin Goodell, PT  Office # [phone] Pager  [phone]    Lorriane Shire 08/07/2019, 10:12 AM

## 2019-08-07 NOTE — Progress Notes (Signed)
CCMD called, patient had a missed beat where ICD tried to kick in at 1052 and 1113. Patient asymptomatic lying in bed. MD paged at this time.

## 2019-08-07 NOTE — Plan of Care (Signed)
Discharge to home °

## 2019-08-07 NOTE — Plan of Care (Signed)
Continue to monitor

## 2019-08-07 NOTE — Discharge Instructions (Signed)
 [facility] [place]  Discharge instructions  Lower Extremity Bypass Surgery  Please refer to the following instruction for your post-procedure care. Your surgeon or physician assistant will discuss any changes with you.  Activity  You are encouraged to walk as much as you can. You can slowly return to normal activities during the month after your surgery. Avoid strenuous activity and heavy lifting until your doctor tells you it's OK. Avoid activities such as vacuuming or swinging a golf club. Do not drive until your doctor give the OK and you are no longer taking prescription pain medications. It is also normal to have difficulty with sleep habits, eating and bowel movement after surgery. These will go away with time.  Bathing/Showering  Shower daily after you go home. Do not soak in a bathtub, hot tub, or swim until the incision heals completely.  Incision Care  Clean your incision with mild soap and water. Shower every day. Pat the area dry with a clean towel. You do not need a bandage unless otherwise instructed. Do not apply any ointments or creams to your incision. If you have open wounds you will be instructed how to care for them or a visiting nurse may be arranged for you. If you have staples or sutures along your incision they will be removed at your post-op appointment. You may have skin glue on your incision. Do not peel it off. It will come off on its own in about one week.  Wash the groin wound with soap and water daily and pat dry. (No tub bath-only shower)  Then put a dry gauze or washcloth in the groin to keep this area dry to help prevent wound infection.  Do this daily and as needed.  Do not use Vaseline or neosporin on your incisions.  Only use soap and water on your incisions and then protect and keep dry.  Diet  Resume your normal diet. There are no special food restrictions following this procedure. A low fat/ low cholesterol diet is  recommended for all patients with vascular disease. In order to heal from your surgery, it is CRITICAL to get adequate nutrition. Your body requires vitamins, minerals, and protein. Vegetables are the best source of vitamins and minerals. Vegetables also provide the perfect balance of protein. Processed food has little nutritional value, so try to avoid this.  Medications  Resume taking all your medications unless your doctor or physician assistant tells you not to. If your incision is causing pain, you may take over-the-counter pain relievers such as acetaminophen (Tylenol). If you were prescribed a stronger pain medication, please aware these medication can cause nausea and constipation. Prevent nausea by taking the medication with a snack or meal. Avoid constipation by drinking plenty of fluids and eating foods with high amount of fiber, such as fruits, vegetables, and grains. Take Colace 100 mg (an over-the-counter stool softener) twice a day as needed for constipation.  Do not take Tylenol if you are taking prescription pain medications.  Follow Up  Our office will schedule a follow up appointment 2-3 weeks following discharge.  Please call us immediately for any of the following conditions  Severe or worsening pain in your legs or feet while at rest or while walking Increase pain, redness, warmth, or drainage (pus) from your incision site(s) Fever of 101 degree or higher The swelling in your leg with the bypass suddenly worsens and becomes more painful than when you were in the hospital If you have   been instructed to feel your graft pulse then you should do so every day. If you can no longer feel this pulse, call the office immediately. Not all patients are given this instruction.  Leg swelling is common after leg bypass surgery.  The swelling should improve over a few months following surgery. To improve the swelling, you may elevate your legs above the level of your heart while you are  sitting or resting. Your surgeon or physician assistant may ask you to apply an ACE wrap or wear compression (TED) stockings to help to reduce swelling.  Reduce your risk of vascular disease  Stop smoking. If you would like help call [facility] at 1-800-QUIT-NOW (1-800-784-8669) or [place] at 336-586-4000.  Manage your cholesterol Maintain a desired weight Control your diabetes weight Control your diabetes Keep your blood pressure down  If you have any questions, please call the office at [phone]  

## 2019-08-07 NOTE — Discharge Summary (Addendum)
Discharge Summary     Anthony Meadows 07-20-1951 68 y.o. male  [id number]  Admission Date: 08/04/2019  Discharge Date: 08/07/2019  Physician: Larina Earthly, MD  Admission Diagnosis: right foot ischemia  HPI:   This is a 68 y.o. male presenting with right leg ischemia.  He is well-known to me from prior events.  He has a very complex past history.  He had multiple right leg vascular interventions and surgeries in [place] approximately 2012.  He had presented to our's facility in 2016 with recurrent right leg ischemia.  At that time he had an occluded right axillary to femoral bypass.  He also had had a prior vein femoral to popliteal bypass.  It had multiple angioplasty interventions in his iliac and superficial femoral artery in [place].  Patient is also status post coronary artery bypass grafting.  He underwent thrombectomy of his axillofemoral bypass by myself in June 2016 and a patch of the arterial anastomosis.  At that time he was found to have no inflow from his native iliac artery.  He presented 15 months later in September 2017 again with thrombosis of his right axillary to femoral bypass.  He underwent thrombectomy of this again.  He had early reocclusion and was taken back to the operating room a month and a half later on August 16, 2016 where he underwent thrombectomy of his right axillary and femoral bypass but also underwent a left to right femoral to femoral bypass to hopefully get better inflow to his right groin.  He has done well in the 3-year since then.  He presents today to the [facility] emergency room was found to have severe ischemia there with no Doppler flow in his foot.  He was transferred to [facility] for definitive treatment.  He reports to me that he has had approximately 1 week of ischemic symptoms and this is progressed over that week.  He is able to walk with some difficulty and is able to feel light sensation in his foot.  Hospital Course:   The patient was admitted to the hospital and taken to the operating room on 08/04/2019 - 08/05/2019 and underwent: Right groin exploration, thrombectomy of right axillary to femoral bypass and right femoral to popliteal bypass    The pt tolerated the procedure well and was transported to the PACU in good condition.   On the morning of surgery, he remained intubated.    By POD 2, he was transferred to the floor.  His Xarelto was restarted.  On POD 3, pt was doing well.  His pre op pain resolved and now just surgical pain.  His calf is soft and non tender. He is discharged home.  He did receive an influenza vaccine prior to discharge.   The remainder of the hospital course consisted of increasing mobilization and increasing intake of solids without difficulty.  CBC    Component Value Date/Time   WBC 9.4 08/07/2019 0315   RBC 2.72 (L) 08/07/2019 0315   HGB 9.1 (L) 08/07/2019 0315   HCT 28.1 (L) 08/07/2019 0315   PLT 134 (L) 08/07/2019 0315   MCV 103.3 (H) 08/07/2019 0315   MCH 33.5 08/07/2019 0315   MCHC 32.4 08/07/2019 0315   RDW 14.0 08/07/2019 0315   LYMPHSABS 2.2 06/09/2016 1150   MONOABS 0.8 06/09/2016 1150   EOSABS 0.5 06/09/2016 1150   BASOSABS 0.0 06/09/2016 1150    BMET    Component Value Date/Time   NA 139 08/06/2019 0424  K 3.6 08/06/2019 0424   CL 108 08/06/2019 0424   CO2 23 08/06/2019 0424   GLUCOSE 131 (H) 08/06/2019 0424   BUN 29 (H) 08/06/2019 0424   CREATININE 1.61 (H) 08/06/2019 0424   CALCIUM 7.7 (L) 08/06/2019 0424   GFRNONAA 44 (L) 08/06/2019 0424   GFRAA 51 (L) 08/06/2019 0424       Discharge Diagnosis:  right foot ischemia  Secondary Diagnosis: Patient Active Problem List   Diagnosis Date Noted  . Femoral-femoral bypass graft thrombosis, right (HCC) 08/05/2019  . Critical lower limb ischemia 08/05/2019  . S/P femoral-femoral bypass surgery 08/04/2019  . Acute renal failure superimposed on stage 3 chronic kidney disease (HCC) 08/15/2016   . 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 Ischemia of lower extremity 06/21/2016  . CAD (coronary artery disease), native coronary artery 04/11/2015  . Benign essential HTN 04/11/2015  . Atrial fibrillation (HCC) 04/11/2015  . Chronic systolic heart failure (HCC) 04/11/2015  . Pre-op evaluation   . PVD (peripheral vascular disease) (HCC) 04/10/2015   Past Medical History:  Diagnosis Date  . AICD (automatic cardioverter/defibrillator) present    new device - 08/2015, [employer] DEVICE  . Anxiety   . Atrial fibrillation with rapid ventricular response (HCC)   . CHF (congestive heart failure) (HCC)   . Complication of anesthesia    "takes him awhile to come out of it" (08/14/2016)  . COPD (chronic obstructive pulmonary disease) (HCC)   . Coronary artery disease   . Dysrhythmia    a-fib, h/o cardioversion   . Hypercholesterolemia   . Hypertension   . Ischemic cardiomyopathy   . Myocardial infarction [facility]) 2002   "before heart OR"  . On home oxygen therapy    "2L; only when I need it" (08/14/2016)  . Pre-diabetes    "put him on RX then took him off earlier this year" (08/14/2016)  . PVD (peripheral vascular disease) (HCC)      Allergies as of 08/07/2019      Reactions   Amiodarone Other (See Comments)   Amio-induced hyperthyroidism   Metformin Diarrhea, Nausea And Vomiting   Metformin And Related Diarrhea, Nausea And Vomiting   Penicillins Hives, Swelling   Did it involve swelling of the face/tongue/throat, SOB, or low BP? Yes Did it involve sudden or severe rash/hives, skin peeling, or any reaction on the inside of your mouth or nose? No Did you need to seek medical attention at a hospital or doctor's office? No When did it last happen? More than 10 years ago If all above answers are "NO", may proceed with cephalosporin use.      Medication List    TAKE these medications   albuterol 108 (90 Base) MCG/ACT inhaler Commonly known as: VENTOLIN HFA Inhale 2 puffs  into the lungs every 6 (six) hours as needed for wheezing or shortness of breath.   atorvastatin 80 MG tablet Commonly known as: LIPITOR Take 80 mg by mouth at bedtime.   Entresto 49-51 MG Generic drug: sacubitril-valsartan Take 1 tablet by mouth 2 (two) times daily.   HYDROcodone-acetaminophen 5-325 MG tablet Commonly known as: NORCO/VICODIN Take 1 tablet by mouth every 6 (six) hours as needed for moderate pain. What changed:   how much to take  when to take this   LORazepam 0.5 MG tablet Commonly known as: ATIVAN Take 0.5 mg by mouth 2 (two) times daily.   metoprolol succinate 50 MG 24 hr tablet Commonly known as: TOPROL-XL Take 50 mg by mouth daily.  Rivaroxaban 15 MG Tabs tablet Commonly known as: XARELTO Take 1 tablet (15 mg total) by mouth daily with supper.   torsemide 20 MG tablet Commonly known as: DEMADEX Take 20 mg by mouth daily.   Vitamin D3 250 MCG (10000 UT) Tabs Take 10,000 Units by mouth daily after breakfast.       Discharge Instructions: [facility] Discharge instructions Lower Extremity Bypass Surgery  Please refer to the following instruction for your post-procedure care. Your surgeon or physician assistant will discuss any changes with you.  Activity  You are encouraged to walk as much as you can. You can slowly return to normal activities during the month after your surgery. Avoid strenuous activity and heavy lifting until your doctor tells you it's OK. Avoid activities such as vacuuming or swinging a golf club. Do not drive until your doctor give the OK and you are no longer taking prescription pain medications. It is also normal to have difficulty with sleep habits, eating and bowel movement after surgery. These will go away with time.  Bathing/Showering  You may shower after you go home. Do not soak in a bathtub, hot tub, or swim until the incision heals completely.  Incision Care  Clean your incision  with mild soap and water. Shower every day. Pat the area dry with a clean towel. You do not need a bandage unless otherwise instructed. Do not apply any ointments or creams to your incision. If you have open wounds you will be instructed how to care for them or a visiting nurse may be arranged for you. If you have staples or sutures along your incision they will be removed at your post-op appointment. You may have skin glue on your incision. Do not peel it off. It will come off on its own in about one week.  Wash the groin wound with soap and water daily and pat dry. (No tub bath-only shower)  Then put a dry gauze or washcloth in the groin to keep this area dry to help prevent wound infection.  Do this daily and as needed.  Do not use Vaseline or neosporin on your incisions.  Only use soap and water on your incisions and then protect and keep dry.  Diet  Resume your normal diet. There are no special food restrictions following this procedure. A low fat/ low cholesterol diet is recommended for all patients with vascular disease. In order to heal from your surgery, it is CRITICAL to get adequate nutrition. Your body requires vitamins, minerals, and protein. Vegetables are the best source of vitamins and minerals. Vegetables also provide the perfect balance of protein. Processed food has little nutritional value, so try to avoid this.  Medications  Resume taking all your medications unless your doctor or Physician Assistant tells you not to. If your incision is causing pain, you may take over-the-counter pain relievers such as acetaminophen (Tylenol). If you were prescribed a stronger pain medication, please aware these medication can cause nausea and constipation. Prevent nausea by taking the medication with a snack or meal. Avoid constipation by drinking plenty of fluids and eating foods with high amount of fiber, such as fruits, vegetables, and grains. Take Colace 100 mg (an over-the-counter stool  softener) twice a day as needed for constipation.  Do not take Tylenol if you are taking prescription pain medications.  Follow Up  Our office will schedule a follow up appointment 2-3 weeks following discharge.  Please call us immediately for any of  the following conditions  .Severe or worsening pain in your legs or feet while at rest or while walking .Increase pain, redness, warmth, or drainage (pus) from your incision site(s) . Fever of 101 degree or higher . The swelling in your leg with the bypass suddenly worsens and becomes more painful than when you were in the hospital . If you have been instructed to feel your graft pulse then you should do so every day. If you can no longer feel this pulse, call the office immediately. Not all patients are given this instruction. .  Leg swelling is common after leg bypass surgery.  The swelling should improve over a few months following surgery. To improve the swelling, you may elevate your legs above the level of your heart while you are sitting or resting. Your surgeon or physician assistant may ask you to apply an ACE wrap or wear compression (TED) stockings to help to reduce swelling.  Reduce your risk of vascular disease  Stop smoking. If you would like help call [facility] at 1-800-QUIT-NOW 463-183-3102) or [place] at (778)126-8814.  . Manage your cholesterol . Maintain a desired weight . Control your diabetes weight . Control your diabetes . Keep your blood pressure down .  If you have any questions, please call the office at [phone]   Prescriptions given: 1.  Vicodin #20 No Refill  Disposition: home  Patient's condition: is Good  Follow up: 1. Dr. Donnetta Hutching in 2-3 weeks   Leontine Locket, PA-C [facility] [phone] 08/07/2019  8:22 AM  - For VQI Registry use ---   Post-op:  Wound infection: No  Graft infection: No  Transfusion: No    If yes, n/a units given New Arrhythmia: No  Ipsilateral amputation: No, [ ]  Minor, [ ]  BKA, [ ]  AKA Discharge patency: [ ]  Primary, [x ] Primary assisted, [ ]  Secondary, [ ]  Occluded Patency judged by: [x ] Dopper only, [ ]  Palpable graft pulse, []  Palpable distal pulse, [ ]  ABI inc. > 0.15, [ ]  Duplex Discharge ABI: R not done, L  D/C Ambulatory Status: Ambulatory  Complications: MI: No, [ ]  Troponin only, [ ]  EKG or Clinical CHF: No Resp failure:No, [ ]  Pneumonia, [ ]  Ventilator Chg in renal function: No, [ ]  Inc. Cr > 0.5, [ ]  Temp. Dialysis,  [ ]  Permanent dialysis Stroke: No, [ ]  Minor, [ ]  Major Return to OR: No  Reason for return to OR: [ ]  Bleeding, [ ]  Infection, [ ]  Thrombosis, [ ]  Revision  Discharge medications: Statin use:  yes ASA use:  no Plavix use:  no Beta blocker use: yes CCB use:  No ACEI use:   yes ARB use:  no Coumadin use: no Xarelto:  Yes

## 2019-08-07 NOTE — Progress Notes (Addendum)
  Progress Note    08/07/2019 8:01 AM 3 Days Post-Op  Subjective:  Says his foot feels a lot better than before surgery  afebrile HR 90's-110's Afib 630'Z-601'U systolic 93% RA  Vitals:   08/06/19 2356 08/07/19 0325  BP: 110/68 120/81  Pulse: 72 (!) 104  Resp: (!) 24 (!) 24  Temp: 98.1 F (36.7 C) 98.2 F (36.8 C)  SpO2: 100% 97%    Physical Exam: Cardiac:  irregular Lungs:  Non labored Incisions:  Right groin is clean and dry without hematoma Extremities:  Brisk right DP doppler signal; monophasic right DP/peroneal doppler signals; right calf is soft and non tender to palpation.   CBC    Component Value Date/Time   WBC 9.4 08/07/2019 0315   RBC 2.72 (L) 08/07/2019 0315   HGB 9.1 (L) 08/07/2019 0315   HCT 28.1 (L) 08/07/2019 0315   PLT 134 (L) 08/07/2019 0315   MCV 103.3 (H) 08/07/2019 0315   MCH 33.5 08/07/2019 0315   MCHC 32.4 08/07/2019 0315   RDW 14.0 08/07/2019 0315   LYMPHSABS 2.2 06/09/2016 1150   MONOABS 0.8 06/09/2016 1150   EOSABS 0.5 06/09/2016 1150   BASOSABS 0.0 06/09/2016 1150    BMET    Component Value Date/Time   NA 139 08/06/2019 0424   K 3.6 08/06/2019 0424   CL 108 08/06/2019 0424   CO2 23 08/06/2019 0424   GLUCOSE 131 (H) 08/06/2019 0424   BUN 29 (H) 08/06/2019 0424   CREATININE 1.61 (H) 08/06/2019 0424   CALCIUM 7.7 (L) 08/06/2019 0424   GFRNONAA 44 (L) 08/06/2019 0424   GFRAA 51 (L) 08/06/2019 0424    INR    Component Value Date/Time   INR 1.7 (H) 08/04/2019 1840     Intake/Output Summary (Last 24 hours) at 08/07/2019 0801 Last data filed at 08/06/2019 1947 Gross per 24 hour  Intake 840 ml  Output 600 ml  Net 240 ml     Assessment:  68 y.o. male is s/p:  : Right groin exploration, thrombectomy of right axillary to femoral bypass and right femoral to popliteal bypass  3 Days Post-Op  Plan: -pt with brisk right DP doppler signal -sx improved -pt transferred up from ICU yesterday -DVT prophylaxis:  Xarelto  restarted yesterday -most likely discharge home today -groin wound care discussed with pt.    Leontine Locket, PA-C [facility] [phone] 08/07/2019 8:01 AM  Legs feel much better today.  Was able to ambulate.  HE has audible DP signals, incision is clean.  Plan for d/c home today.  HE will continue Camie Patience

## 2019-08-07 NOTE — Consult Note (Signed)
ELECTROPHYSIOLOGY CONSULT NOTE    Primary Care Physician: Guy Begin, FNP Referring Physician:  Dr Trula Slade  Admit Date: 08/04/2019  Reason for consultation:  Pacing spikes observed on telemetry  Anthony Meadows is a 68 y.o. male with a h/o persistent atrial fibrillation, ischemic CM, s/p ICD implantation, and PVD.  His ICD is followed by Dr Hadley Pen in [place], implanted previously at [facility].   The patient is now s/p R groin exploration with thrombectomy of R axillary to femoral bypass and R femoral to popliteal bypass.  He has recovered uneventfully.   He was planned to be discharged today, when he was noted to have a "pacemaker spike" on telemetry.  EP is asked to evaluate.  Today, he denies symptoms of palpitations, chest pain, shortness of breath (above baseline),  dizziness, presyncope, syncope, or neurologic sequela. The patient is tolerating medications without difficulties and is otherwise without complaint today.  He is very clear that he wishes to be discharged at this time.  Past Medical History:  Diagnosis Date  . AICD (automatic cardioverter/defibrillator) present    new device - 08/2015, [employer] DEVICE  . Anxiety   . CHF (congestive heart failure) ([place])   . Complication of anesthesia    "takes him awhile to come out of it" (08/14/2016)  . COPD (chronic obstructive pulmonary disease) ([place])   . Coronary artery disease   . Hypercholesterolemia   . Hypertension   . Ischemic cardiomyopathy   . Myocardial infarction [facility]) 2002   "before heart OR"  . On home oxygen therapy    "2L; only when I need it" (08/14/2016)  . Persistent atrial fibrillation ([place])   . Pre-diabetes    "put him on RX then took him off earlier this year" (08/14/2016)  . PVD (peripheral vascular disease) ([place])    Past Surgical History:  Procedure Laterality Date  . ANGIOPLASTY / STENTING FEMORAL Right ~2012  . CARDIAC CATHETERIZATION  2002  . CARDIAC DEFIBRILLATOR PLACEMENT  01/2008   Medtronic ;  Virtuoso VR defibrillator; Model D154VWC; Serial [id number]  . CORONARY ANGIOPLASTY    . CORONARY ARTERY BYPASS GRAFT  2002   'CABG X5"  . FEMORAL-FEMORAL BYPASS GRAFT Bilateral 08/16/2016   Procedure: BYPASS GRAFT FEMORAL-FEMORAL ARTERY USING 8MM X 30CM HEMASHIELD GRAFT;  Surgeon: Rosetta Posner, MD;  Location: [place];  Service: Vascular;  Laterality: Bilateral;  . FEMORAL-POPLITEAL BYPASS GRAFT Right 08/04/2019   Procedure: THROMBECTOMY AXILLA FEMORY AND FEM FEM BYPASS;  Surgeon: Rosetta Posner, MD;  Location: [place];  Service: Vascular;  Laterality: Right;  . PATCH ANGIOPLASTY Right 04/12/2015   Procedure: PATCH ANGIOPLASTY Right Femoral Artery;  Surgeon: Rosetta Posner, MD;  Location: [place];  Service: Vascular;  Laterality: Right;  . THROMBECTOMY FEMORAL ARTERY Right 04/12/2015   Procedure: THROMBECTOMY Right Axilla-Femoral Gortex Graft;  Surgeon: Rosetta Posner, MD;  Location: [place];  Service: Vascular;  Laterality: Right;  . THROMBECTOMY FEMORAL ARTERY Right 06/21/2016   Procedure: RIGHT AXILLARY-FEMORAL THROMBECTOMY;  Surgeon: Rosetta Posner, MD;  Location: [place];  Service: Vascular;  Laterality: Right;  . THROMBECTOMY FEMORAL ARTERY Right 08/16/2016   Procedure: THROMBECTOMY AXILLOFEMORAL BYPASS GRAFT;  Surgeon: Rosetta Posner, MD;  [facility];  Service: Vascular;  Laterality: Right;    . Chlorhexidine Gluconate Cloth  6 each Topical Daily  . docusate sodium  100 mg Oral Daily  . famotidine  20 mg Oral Daily  . [START ON 08/08/2019] influenza vaccine adjuvanted  0.5 mL Intramuscular Tomorrow-1000  .  mouth rinse  15 mL Mouth Rinse BID  . pantoprazole  40 mg Oral Daily  . Rivaroxaban  15 mg Oral Q supper   . sodium chloride    . magnesium sulfate bolus IVPB      Allergies  Allergen Reactions  . Amiodarone Other (See Comments)    Amio-induced hyperthyroidism    . Metformin Diarrhea and Nausea And Vomiting  . Metformin And Related Diarrhea and Nausea And Vomiting  . Penicillins Hives  and Swelling    Did it involve swelling of the face/tongue/throat, SOB, or low BP? Yes Did it involve sudden or severe rash/hives, skin peeling, or any reaction on the inside of your mouth or nose? No Did you need to seek medical attention at a hospital or doctor's office? No When did it last happen? More than 10 years ago If all above answers are "NO", may proceed with cephalosporin use.    Social History   Socioeconomic History  . Marital status: Married    Spouse name: Not on file  . Number of children: Not on file  . Years of education: Not on file  . Highest education level: Not on file  Occupational History  . Not on file  Social Needs  . Financial resource strain: Not on file  . Food insecurity    Worry: Not on file    Inability: Not on file  . Transportation needs    Medical: Not on file    Non-medical: Not on file  Tobacco Use  . Smoking status: Former Smoker    Packs/day: 3.00    Years: 52.00    Pack years: 156.00    Types: Cigarettes    Quit date: 08/04/2016    Years since quitting: 3.0  . Smokeless tobacco: Never Used  Substance and Sexual Activity  . Alcohol use: No  . Drug use: No  . Sexual activity: Not Currently  Lifestyle  . Physical activity    Days per week: Not on file    Minutes per session: Not on file  . Stress: Not on file  Relationships  . Social Musicianconnections    Talks on phone: Not on file    Gets together: Not on file    Attends religious service: Not on file    Active member of club or organization: Not on file    Attends meetings of clubs or organizations: Not on file    Relationship status: Not on file  . Intimate partner violence    Fear of current or ex partner: Not on file    Emotionally abused: Not on file    Physically abused: Not on file    Forced sexual activity: Not on file  Other Topics Concern  . Not on file  Social History Narrative  . Not on file   FH- HTN  ROS- All systems are reviewed and negative except as per  the HPI above  Physical Exam: Telemetry:  Afib,  Frequent RVR,  Rare NSVT and PVCs,  Appropriate ventricular pacing is observed during rare brady events. No abnormal device function observed Vitals:   08/06/19 2356 08/07/19 0325 08/07/19 0844 08/07/19 1224  BP: 110/68 120/81 112/73 131/72  Pulse: 72 (!) 104 89 93  Resp: (!) 24 (!) 24 18 18   Temp: 98.1 F (36.7 C) 98.2 F (36.8 C) 98.2 F (36.8 C) 98.2 F (36.8 C)  TempSrc: Oral Oral Oral Tympanic  SpO2: 100% 97% 98% 100%  Weight:      Height:  GEN- The patient is chronically ill appearing, alert and oriented x 3 today.   Head- normocephalic, atraumatic Eyes-  Sclera clear, conjunctiva pink Ears- hearing intact Oropharynx- clear Neck- supple,   Lungs-  normal work of breathing Heart- irregular rate and rhythm  GI- soft, NT, ND, + BS Extremities- no clubbing, cyanosis, or edema   Labs:   Lab Results  Component Value Date   WBC 9.4 08/07/2019   HGB 9.1 (L) 08/07/2019   HCT 28.1 (L) 08/07/2019   MCV 103.3 (H) 08/07/2019   PLT 134 (L) 08/07/2019    Recent Labs  Lab 08/06/19 0424  NA 139  K 3.6  CL 108  CO2 23  BUN 29*  CREATININE 1.61*  CALCIUM 7.7*  GLUCOSE 131*     Radiology:  Single chamber MDT ICD noted on CXR   ASSESSMENT AND PLAN:   1. Persistent afib with elevated V rates and occasional brady events He reports that his AF is chronic and well tolerated.  He is resistant to changes today. chads2vasc score is 5.  He is on xarelto  2. Device function No abnormal findings on telemetry He has appropriate V pacing during prolonged RR intervals  No indication for device interrogation today  Ok to discharge Follow-up with his primary cardiologist for AF management  Electrophysiology team to see as needed while here. Please call with questions.  Hillis Range, MD 08/07/2019  1:29 PM

## 2019-08-09 ENCOUNTER — Telehealth: Payer: Self-pay

## 2019-08-09 NOTE — Telephone Encounter (Signed)
Pt called and said that he had some drainage from an open spot on his leg. He said that he has been walking a lot and just wanted to be sure that this was ok.   Called pt and he said that the area is very tiny, smaller than the head of a pencil. He said that the drainage is clear. No pain, no fever or heat. He will keep the area clean with light gauze wrap and elevate legs when he is not walking. No injury per pt. He has been advised that if the area gets worse to notify the office.   York Cerise, CMA

## 2019-08-31 ENCOUNTER — Ambulatory Visit (INDEPENDENT_AMBULATORY_CARE_PROVIDER_SITE_OTHER): Payer: Self-pay | Admitting: Vascular Surgery

## 2019-08-31 ENCOUNTER — Encounter: Payer: Self-pay | Admitting: Vascular Surgery

## 2019-08-31 ENCOUNTER — Other Ambulatory Visit: Payer: Self-pay

## 2019-08-31 VITALS — BP 119/75 | HR 101 | Temp 98.1°F | Resp 20 | Ht 65.0 in | Wt 149.7 lb

## 2019-08-31 DIAGNOSIS — I779 Disorder of arteries and arterioles, unspecified: Secondary | ICD-10-CM

## 2019-08-31 NOTE — Progress Notes (Signed)
   Patient name: Anthony Meadows MRN: [id number] DOB: Oct 01, 1951 Sex: male  REASON FOR VISIT: Follow-up thrombectomy of right axillary to femoral and femoral to popliteal bypass on 08/05/2019  HPI: Anthony Meadows is a 68 y.o. male here today for follow-up.  Has history of prior multiple revascularizations initially in [place] and then in [place].  He had presented on 08/05/2019 with right leg ischemia.  He had been off his anticoagulation for silt shoulder manipulation and possible shoulder surgery.  He had profound ischemia with loss of motor and sensory function.  Taken to the operating room and underwent thrombectomy.  He was found to have chronic occlusion of his right to left femorofemoral bypass.  He had easily thrombectomized axillary to femoral bypass and also his vein femoropopliteal bypass.  He was discharged home without difficulty he reports no further ischemic symptoms and has had no difficulty in healing  Current Outpatient Medications  Medication Sig Dispense Refill  . albuterol (PROVENTIL HFA;VENTOLIN HFA) 108 (90 Base) MCG/ACT inhaler Inhale 2 puffs into the lungs every 6 (six) hours as needed for wheezing or shortness of breath.     Marland Kitchen atorvastatin (LIPITOR) 80 MG tablet Take 80 mg by mouth at bedtime.     . Cholecalciferol (VITAMIN D3) 10000 units TABS Take 10,000 Units by mouth daily after breakfast.     . ferrous sulfate 325 (65 FE) MG EC tablet Take 1 tablet by mouth daily.    Marland Kitchen glimepiride (AMARYL) 2 MG tablet Take 2 mg by mouth every morning.    Marland Kitchen LORazepam (ATIVAN) 0.5 MG tablet Take 0.5 mg by mouth 2 (two) times daily.     . metoprolol succinate (TOPROL-XL) 50 MG 24 hr tablet Take 50 mg by mouth daily.    . nitroGLYCERIN (NITRODUR - DOSED IN MG/24 HR) 0.1 mg/hr patch 0.1 mg daily.    . Rivaroxaban (XARELTO) 15 MG TABS tablet Take 1 tablet (15 mg total) by mouth daily with supper. 42 tablet   . sacubitril-valsartan (ENTRESTO) 49-51  MG Take 1 tablet by mouth 2 (two) times daily.    Marland Kitchen torsemide (DEMADEX) 20 MG tablet Take 20 mg by mouth daily.      No current facility-administered medications for this visit.      PHYSICAL EXAM: Vitals:   08/31/19 1508  BP: 119/75  Pulse: (!) 101  Resp: 20  Temp: 98.1 F (36.7 C)  SpO2: 98%  Weight: 149 lb 11.2 oz (67.9 kg)  Height: 5\' 5"  (1.651 m)    GENERAL: The patient is a well-nourished male, in no acute distress. The vital signs are documented above. Right groin well-healed.  He has easily palpable left femoral pulse and right femoral pulse.  He has Doppler flow in his axillofemoral bypass.  No flow in his known occluded femorofemoral bypass.  He has a easily palpable left femoral pulse.  He does have Doppler flow in his right dorsalis pedis and posterior tibial  MEDICAL ISSUES: Stable status post recurrent occlusion of axillofemoral bypass.  Has been deemed a nonaortofemoral candidate many years ago.  Severe coronary disease.  Did explain the high risk for recurrent occlusion of his axillofemoral bypass.  He will continue on his anticoagulation therapy.  We will see him again in 3 months for continued follow-up   Rosetta Posner, MD [facility] of [facility] Tel [phone] Pager [phone]

## 2019-09-07 ENCOUNTER — Other Ambulatory Visit: Payer: Self-pay

## 2019-09-07 DIAGNOSIS — I779 Disorder of arteries and arterioles, unspecified: Secondary | ICD-10-CM

## 2019-12-07 ENCOUNTER — Other Ambulatory Visit: Payer: Self-pay

## 2019-12-07 ENCOUNTER — Ambulatory Visit (INDEPENDENT_AMBULATORY_CARE_PROVIDER_SITE_OTHER)
Admission: RE | Admit: 2019-12-07 | Discharge: 2019-12-07 | Disposition: A | Discharge status: Home / Self Care | Payer: Medicare Other | Source: Ambulatory Visit | Attending: Vascular Surgery | Admitting: Vascular Surgery

## 2019-12-07 ENCOUNTER — Ambulatory Visit (HOSPITAL_COMMUNITY)
Admission: RE | Admit: 2019-12-07 | Discharge: 2019-12-07 | Disposition: A | Discharge status: Home / Self Care | Payer: Medicare Other | Source: Ambulatory Visit | Attending: Vascular Surgery | Admitting: Vascular Surgery

## 2019-12-07 ENCOUNTER — Ambulatory Visit (INDEPENDENT_AMBULATORY_CARE_PROVIDER_SITE_OTHER): Payer: Medicare Other | Admitting: Vascular Surgery

## 2019-12-07 ENCOUNTER — Encounter: Payer: Self-pay | Admitting: Vascular Surgery

## 2019-12-07 VITALS — BP 120/71 | HR 81 | Temp 97.4°F | Resp 20 | Ht 65.0 in | Wt 141.7 lb

## 2019-12-07 DIAGNOSIS — Z95828 Presence of other vascular implants and grafts: Secondary | ICD-10-CM | POA: Diagnosis not present

## 2019-12-07 DIAGNOSIS — I779 Disorder of arteries and arterioles, unspecified: Secondary | ICD-10-CM | POA: Diagnosis not present

## 2019-12-07 NOTE — Progress Notes (Signed)
Vascular and Vein Specialist of [facility]  Patient name: Anthony Meadows MRN: [id number] DOB: 18-Oct-1951 Sex: male  REASON FOR VISIT: Follow-up extensive peripheral vascular occlusive disease  HPI: Anthony Meadows is a 69 y.o. male for follow-up.  I offered to include his wife by telephone and he declined this.  He has a very extensive past history with peripheral vascular disease.  He reports that he was just discharged from [facility] after a 5 to 6-day hospitalization for congestive heart failure.  He looks much more debilitated today than my past visits with him.  He appears to have lost a great deal of weight and has a minimal stamina.  He is able to answer questions appropriately.  He reports some claudication symptoms but no rest pain in his right foot.  In reviewing his past surgical history, he initially underwent right axillary to femoral and femoral to popliteal bypass around 2012 in [place].  He had undergone coronary artery bypass grafting in 2003 and had an AICD placement in 2016.  I first met him in June 2016.  He presented with profound ischemia in his right leg after he had had occlusion of his axillofemoral and femoropopliteal bypass.  He underwent thrombectomy of this.  He then had recurrence approximately 15 months later in September 2017.  He underwent repeat thrombectomy and had an Mathilda Maguire failure of this.  He underwent left to right femorofemoral bypass in addition to his axillofemoral thrombectomy in October 2017 feeling that it may be an inflow issue.  He had never had left-sided ischemia.  He then presented in October 2020 again with profound ischemia in his right foot with occlusion of his right axillofemoral and femorofemoral bypass.  His left to right femorofemoral bypass had been chronically occluded he underwent thrombectomy and October 2020.  Past Medical History:  Diagnosis Date  . AICD (automatic  cardioverter/defibrillator) present    new device - 08/2015, [employer] DEVICE  . Anxiety   . CHF (congestive heart failure) ([place])   . Complication of anesthesia    "takes him awhile to come out of it" (08/14/2016)  . COPD (chronic obstructive pulmonary disease) ([place])   . Coronary artery disease   . Hypercholesterolemia   . Hypertension   . Ischemic cardiomyopathy   . Myocardial infarction [facility]) 2002   "before heart OR"  . On home oxygen therapy    "2L; only when I need it" (08/14/2016)  . Persistent atrial fibrillation ([place])   . Pre-diabetes    "put him on RX then took him off earlier this year" (08/14/2016)  . PVD (peripheral vascular disease) ([place])     History reviewed. No pertinent family history.  SOCIAL HISTORY: Social History   Tobacco Use  . Smoking status: Current Every Day Smoker    Packs/day: 0.25    Years: 52.00    Pack years: 13.00    Types: Cigarettes    Last attempt to quit: 08/04/2016    Years since quitting: 3.3  . Smokeless tobacco: Never Used  . Tobacco comment: 2-3 cigarettes a day  Substance Use Topics  . Alcohol use: No    Allergies  Allergen Reactions  . Amiodarone Other (See Comments)    Amio-induced hyperthyroidism    . Metformin Diarrhea and Nausea And Vomiting  . Metformin And Related Diarrhea and Nausea And Vomiting  . Penicillins Hives and Swelling    Did it involve swelling of the face/tongue/throat, SOB, or low BP? Yes Did it involve sudden or severe  rash/hives, skin peeling, or any reaction on the inside of your mouth or nose? No Did you need to seek medical attention at a hospital or doctor's office? No When did it last happen? More than 10 years ago If all above answers are "NO", may proceed with cephalosporin use.    Current Outpatient Medications  Medication Sig Dispense Refill  . albuterol (PROVENTIL HFA;VENTOLIN HFA) 108 (90 Base) MCG/ACT inhaler Inhale 2 puffs into the lungs every 6 (six) hours as needed for wheezing or  shortness of breath.     Marland Kitchen atorvastatin (LIPITOR) 80 MG tablet Take 80 mg by mouth at bedtime.     . Cholecalciferol (VITAMIN D3) 10000 units TABS Take 10,000 Units by mouth daily after breakfast.     . digoxin (LANOXIN) 0.125 MG tablet Take 125 mcg by mouth every other day.    Marland Kitchen FARXIGA 10 MG TABS tablet Take 10 mg by mouth daily.    . ferrous sulfate 325 (65 FE) MG EC tablet Take 1 tablet by mouth daily.    . hydrALAZINE (APRESOLINE) 25 MG tablet Take 25 mg by mouth 2 (two) times daily.    . isosorbide dinitrate (ISORDIL) 30 MG tablet Take 30 mg by mouth daily.    . isosorbide mononitrate (IMDUR) 30 MG 24 hr tablet Take 30 mg by mouth daily.    Marland Kitchen LORazepam (ATIVAN) 0.5 MG tablet Take 0.5 mg by mouth 2 (two) times daily.     . metoprolol succinate (TOPROL-XL) 50 MG 24 hr tablet     . Rivaroxaban (XARELTO) 15 MG TABS tablet Take 1 tablet (15 mg total) by mouth daily with supper. 42 tablet   . spironolactone (ALDACTONE) 25 MG tablet Take 25 mg by mouth daily.    . SYMBICORT 160-4.5 MCG/ACT inhaler SMARTSIG:2 Puff(s) By Mouth Twice Daily    . torsemide (DEMADEX) 20 MG tablet Take 40 mg by mouth daily.     No current facility-administered medications for this visit.    REVIEW OF SYSTEMS:  [X]  denotes positive finding, [ ]  denotes negative finding Cardiac  Comments:  Chest pain or chest pressure:    Shortness of breath upon exertion: x   Short of breath when lying flat: x   Irregular heart rhythm: x       Vascular    Pain in calf, thigh, or hip brought on by ambulation: x   Pain in feet at night that wakes you up from your sleep:  x   Blood clot in your veins:    Leg swelling:           PHYSICAL EXAM: Vitals:   12/07/19 1121  BP: 120/71  Pulse: 81  Resp: 20  Temp: (!) 97.4 F (36.3 C)  SpO2: 99%  Weight: 141 lb 11.2 oz (64.3 kg)  Height: 5' 5"  (1.651 m)    GENERAL: The patient is a well-nourished male, in no acute distress. The vital signs are documented  above. CARDIOVASCULAR: He does have a palpable right radial pulse.  I did not palpate femoral pulses or distal pulses PULMONARY: There is good air exchange  MUSCULOSKELETAL: There are no major deformities or cyanosis. NEUROLOGIC: No focal weakness or paresthesias are detected. SKIN: There are no ulcers or rashes noted. PSYCHIATRIC: The patient has a normal affect.  DATA:  Noninvasive studies today reveal occlusion of his right axillofemoral bypass.  He does have patency of his right femoropopliteal bypass.  Ankle arm index is 1.35 on the right and 0.51 on  the left  MEDICAL ISSUES: I had a long discussion with the patient.  He has had recurrent occlusions of his axillofemoral bypass.  He was felt not to be a candidate for aortofemoral bypass grafting in 2012 and clearly is not a candidate for this now.  I did explain the option of recurrent thrombectomy of his axillofemoral graft.  I feel that he has a very high risk of occlusion of this with severe heart failure.  He currently is not having signs of critical limb ischemia which she has with prior occlusion.  I have cautioned him regarding symptoms of critical limb ischemia and he knows to report to [place] immediately.  Otherwise I would recommend cautious observation and hopefully he can tolerate this level of ischemia.  He certainly is at high risk for amputation.  He has had marked continued clinical overall deterioration and I am concerned that this in all likelihood will continue despite any medical treatment efforts.  We will see him again in 3 months with repeat ankle arm index    Rosetta Posner, MD [facility] V[facility] and Ve[facility] Specialists of [facility] Tel [phone] Pager [phone]

## 2019-12-08 ENCOUNTER — Other Ambulatory Visit: Payer: Self-pay | Admitting: *Deleted

## 2019-12-08 DIAGNOSIS — I779 Disorder of arteries and arterioles, unspecified: Secondary | ICD-10-CM

## 2020-02-29 ENCOUNTER — Ambulatory Visit (INDEPENDENT_AMBULATORY_CARE_PROVIDER_SITE_OTHER): Payer: Medicare Other | Admitting: Vascular Surgery

## 2020-02-29 ENCOUNTER — Encounter: Payer: Self-pay | Admitting: Vascular Surgery

## 2020-02-29 ENCOUNTER — Other Ambulatory Visit: Payer: Self-pay

## 2020-02-29 ENCOUNTER — Ambulatory Visit (HOSPITAL_COMMUNITY)
Admission: RE | Admit: 2020-02-29 | Discharge: 2020-02-29 | Disposition: A | Discharge status: Home / Self Care | Payer: Medicare Other | Source: Ambulatory Visit | Attending: Vascular Surgery | Admitting: Vascular Surgery

## 2020-02-29 VITALS — BP 123/88 | HR 95 | Temp 97.3°F | Resp 20 | Ht 65.0 in | Wt 132.0 lb

## 2020-02-29 DIAGNOSIS — Z95828 Presence of other vascular implants and grafts: Secondary | ICD-10-CM

## 2020-02-29 DIAGNOSIS — I779 Disorder of arteries and arterioles, unspecified: Secondary | ICD-10-CM | POA: Diagnosis not present

## 2020-02-29 NOTE — Progress Notes (Signed)
Vascular and Vein Specialist of [place]  Patient name: Anthony Meadows MRN: [id number] DOB: 1951/09/03 Sex: male  REASON FOR VISIT: Follow-up extensive peripheral vascular disease  HPI: Anthony Meadows is a 69 y.o. male here today for follow-up.  Is with his wife today.  He has had no recent admissions for congestive heart failure.  He had just been discharged on my last visit with him in February 2021.  He is a quite debilitated at the time.  He still remains weak but certainly does appear to be in better shape than he was 3 months ago.  He reports that he stays tired.  He does report claudication type symptoms in both lower extremities and also does have some occasional rest pain in his right foot.  Fortunately he remains having no tissue loss.  He has a new complaint of some fatigue in his right arm.  Past Medical History:  Diagnosis Date  . AICD (automatic cardioverter/defibrillator) present    new device - 08/2015, [employer] DEVICE  . Anxiety   . CHF (congestive heart failure) ([place])   . Complication of anesthesia    "takes him awhile to come out of it" (08/14/2016)  . COPD (chronic obstructive pulmonary disease) ([place])   . Coronary artery disease   . Hypercholesterolemia   . Hypertension   . Ischemic cardiomyopathy   . Myocardial infarction [facility]) 2002   "before heart OR"  . On home oxygen therapy    "2L; only when I need it" (08/14/2016)  . Persistent atrial fibrillation ([place])   . Pre-diabetes    "put him on RX then took him off earlier this year" (08/14/2016)  . PVD (peripheral vascular disease) ([place])     History reviewed. No pertinent family history.  SOCIAL HISTORY: Social History   Tobacco Use  . Smoking status: Current Every Day Smoker    Packs/day: 0.25    Years: 52.00    Pack years: 13.00    Types: Cigarettes    Last attempt to quit: 08/04/2016    Years since quitting: 3.5  . Smokeless tobacco: Never Used  . Tobacco comment:  2-3 cigarettes a day  Substance Use Topics  . Alcohol use: No    Allergies  Allergen Reactions  . Amiodarone Other (See Comments)    Amio-induced hyperthyroidism    . Metformin Diarrhea and Nausea And Vomiting  . Metformin And Related Diarrhea and Nausea And Vomiting  . Penicillins Hives and Swelling    Did it involve swelling of the face/tongue/throat, SOB, or low BP? Yes Did it involve sudden or severe rash/hives, skin peeling, or any reaction on the inside of your mouth or nose? No Did you need to seek medical attention at a hospital or doctor's office? No When did it last happen? More than 10 years ago If all above answers are "NO", may proceed with cephalosporin use.    Current Outpatient Medications  Medication Sig Dispense Refill  . albuterol (PROVENTIL HFA;VENTOLIN HFA) 108 (90 Base) MCG/ACT inhaler Inhale 2 puffs into the lungs every 6 (six) hours as needed for wheezing or shortness of breath.     Marland Kitchen atorvastatin (LIPITOR) 80 MG tablet Take 80 mg by mouth at bedtime.     . Cholecalciferol (VITAMIN D3) 10000 units TABS Take 10,000 Units by mouth daily after breakfast.     . digoxin (LANOXIN) 0.125 MG tablet Take 125 mcg by mouth every other day.    Marland Kitchen FARXIGA 10 MG TABS tablet Take 10 mg by  mouth daily.    . ferrous sulfate 325 (65 FE) MG EC tablet Take 1 tablet by mouth daily.    . hydrALAZINE (APRESOLINE) 25 MG tablet Take 25 mg by mouth 2 (two) times daily.    . isosorbide dinitrate (ISORDIL) 30 MG tablet Take 30 mg by mouth daily.    Marland Kitchen LORazepam (ATIVAN) 0.5 MG tablet Take 0.5 mg by mouth 2 (two) times daily.     . metoprolol succinate (TOPROL-XL) 50 MG 24 hr tablet     . Rivaroxaban (XARELTO) 15 MG TABS tablet Take 1 tablet (15 mg total) by mouth daily with supper. 42 tablet   . spironolactone (ALDACTONE) 25 MG tablet Take 25 mg by mouth daily.    . SYMBICORT 160-4.5 MCG/ACT inhaler SMARTSIG:2 Puff(s) By Mouth Twice Daily    . torsemide (DEMADEX) 20 MG tablet Take 40 mg  by mouth daily.    . isosorbide mononitrate (IMDUR) 30 MG 24 hr tablet Take 30 mg by mouth daily.     No current facility-administered medications for this visit.    REVIEW OF SYSTEMS:  [X]  denotes positive finding, [ ]  denotes negative finding Cardiac  Comments:  Chest pain or chest pressure:    Shortness of breath upon exertion: x   Short of breath when lying flat: x   Irregular heart rhythm:        Vascular    Pain in calf, thigh, or hip brought on by ambulation: x   Pain in feet at night that wakes you up from your sleep:  x   Blood clot in your veins:    Leg swelling:           PHYSICAL EXAM: Vitals:   02/29/20 0927  BP: 123/88  Pulse: 95  Resp: 20  Temp: (!) 97.3 F (36.3 C)  SpO2: 98%  Weight: 132 lb (59.9 kg)  Height: 5\' 5"  (1.651 m)    GENERAL: The patient is a well-nourished male, in no acute distress. The vital signs are documented above. CARDIOVASCULAR: I do not palpate a right radial pulse.  He has a palpable left ulnar pulse and no radial pulse.  I do not palpate a right brachial pulse.  He has no femoral or distal pulses. PULMONARY: There is good air exchange  MUSCULOSKELETAL: There are no major deformities or cyanosis. NEUROLOGIC: No focal weakness or paresthesias are detected. SKIN: There are no ulcers or rashes noted. PSYCHIATRIC: The patient has a normal affect.  DATA:  Noninvasive studies show a marked drop in pressure in his right brachial level in the 80s compared to 140s in the left brachial.  His ankle arm index is 0.46 on the right and 0.56 on the left  MEDICAL ISSUES: Had a long discussion with the patient and his wife.  He does have significant ischemia bilaterally.  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  I have recommended against any  surgery.  Explained that he would require CT angiogram evaluation of his chest abdomen and pelvis.  Also explained in all likelihood he would require a left axillary to femoral and redo femoral to femoral bypass.  He has had very poor patency of his extra-anatomic bypasses since this was initially placed in [place] in 2012.  He will continue to monitor this and let  know if he develops any tissue loss or worsening rest pain.  Otherwise we will see him again in 6 months for continued discussion    Larina Earthly, MD [facility] Vascular and Vein Specialists of [facility] Tel [phone] Pager [phone]

## 2020-07-10 ENCOUNTER — Ambulatory Visit (INDEPENDENT_AMBULATORY_CARE_PROVIDER_SITE_OTHER): Payer: Medicare Other | Admitting: Vascular Surgery

## 2020-07-10 ENCOUNTER — Ambulatory Visit: Payer: Medicare Other | Admitting: Vascular Surgery

## 2020-07-10 ENCOUNTER — Encounter: Payer: Self-pay | Admitting: Vascular Surgery

## 2020-07-10 ENCOUNTER — Other Ambulatory Visit: Payer: Self-pay

## 2020-07-10 VITALS — BP 115/74 | HR 81 | Temp 98.1°F | Resp 14 | Ht 67.0 in | Wt 120.0 lb

## 2020-07-10 DIAGNOSIS — Z95828 Presence of other vascular implants and grafts: Secondary | ICD-10-CM

## 2020-07-10 DIAGNOSIS — I779 Disorder of arteries and arterioles, unspecified: Secondary | ICD-10-CM

## 2020-07-10 NOTE — Progress Notes (Signed)
Vascular and Vein Specialist of [place]  Patient name: Anthony Meadows MRN: [id number] DOB: [birth year] Sex: male  REASON FOR VISIT: Follow-up diffuse peripheral vascular occlusive disease  HPI: Anthony Meadows is a 69 y.o. male here today for follow-up.  Had multiple prior revascularizations dating back to 2012 in [place].  Has known aortic occlusion and undergone extra-anatomic bypasses with multiple failures.  At my last visit with him he was known to have occlusion of his axillofemoral and femorofemoral bypass but was tolerating his level of claudication.  He had had recent exacerbation of his congestive heart failure.  He looks good today.  He does have some claudication type symptoms but no tissue loss.  Past Medical History:  Diagnosis Date  . AICD (automatic cardioverter/defibrillator) present    new device - 08/2015, [employer] DEVICE  . Anxiety   . CHF (congestive heart failure) ([facility])   . Complication of anesthesia    "takes him awhile to come out of it" (08/14/2016)  . COPD (chronic obstructive pulmonary disease) ([facility])   . Coronary artery disease   . Hypercholesterolemia   . Hypertension   . Ischemic cardiomyopathy   . Myocardial infarction [facility]) 2002   "before heart OR"  . On home oxygen therapy    "2L; only when I need it" (08/14/2016)  . Persistent atrial fibrillation ([facility])   . Pre-diabetes    "put him on RX then took him off earlier this year" (08/14/2016)  . PVD (peripheral vascular disease) ([facility])     History reviewed. No pertinent family history.  SOCIAL HISTORY: Social History   Tobacco Use  . Smoking status: Current Every Day Smoker    Packs/day: 0.25    Years: 52.00    Pack years: 13.00    Types: Cigarettes    Last attempt to quit: 08/04/2016    Years since quitting: 3.9  . Smokeless tobacco: Never Used  . Tobacco comment: Uses nicotin inhaler  Substance Use Topics  . Alcohol use: No    Allergies   Allergen Reactions  . Amiodarone Other (See Comments)    Amio-induced hyperthyroidism    . Metformin Diarrhea and Nausea And Vomiting  . Metformin And Related Diarrhea and Nausea And Vomiting  . Penicillins Hives and Swelling    Did it involve swelling of the face/tongue/throat, SOB, or low BP? Yes Did it involve sudden or severe rash/hives, skin peeling, or any reaction on the inside of your mouth or nose? No Did you need to seek medical attention at a hospital or doctor's office? No When did it last happen? More than 10 years ago If all above answers are "NO", may proceed with cephalosporin use.    Current Outpatient Medications  Medication Sig Dispense Refill  . albuterol (PROVENTIL HFA;VENTOLIN HFA) 108 (90 Base) MCG/ACT inhaler Inhale 2 puffs into the lungs every 6 (six) hours as needed for wheezing or shortness of breath.     Marland Kitchen atorvastatin (LIPITOR) 80 MG tablet Take 80 mg by mouth at bedtime.     . Cholecalciferol (VITAMIN D3) 10000 units TABS Take 10,000 Units by mouth daily after breakfast.     . digoxin (LANOXIN) 0.125 MG tablet Take 125 mcg by mouth every other day.    Marland Kitchen FARXIGA 10 MG TABS tablet Take 10 mg by mouth daily.    . ferrous sulfate 325 (65 FE) MG EC tablet Take 1 tablet by mouth daily.    . hydrALAZINE (APRESOLINE) 25 MG tablet Take 25 mg by mouth  2 (two) times daily.    . isosorbide dinitrate (ISORDIL) 30 MG tablet Take 30 mg by mouth daily.    . isosorbide mononitrate (IMDUR) 30 MG 24 hr tablet Take 30 mg by mouth daily.    Marland Kitchen LORazepam (ATIVAN) 0.5 MG tablet Take 0.5 mg by mouth 2 (two) times daily.     . metoprolol succinate (TOPROL-XL) 50 MG 24 hr tablet     . Rivaroxaban (XARELTO) 15 MG TABS tablet Take 1 tablet (15 mg total) by mouth daily with supper. 42 tablet   . spironolactone (ALDACTONE) 25 MG tablet Take 25 mg by mouth daily.    . SYMBICORT 160-4.5 MCG/ACT inhaler SMARTSIG:2 Puff(s) By Mouth Twice Daily    . torsemide (DEMADEX) 20 MG tablet Take 40  mg by mouth daily.     No current facility-administered medications for this visit.    REVIEW OF SYSTEMS:  [X]  denotes positive finding, [ ]  denotes negative finding Cardiac  Comments:  Chest pain or chest pressure:    Shortness of breath upon exertion: x   Short of breath when lying flat: x   Irregular heart rhythm: x       Vascular    Pain in calf, thigh, or hip brought on by ambulation:    Pain in feet at night that wakes you up from your sleep:  x   Blood clot in your veins:    Leg swelling:           PHYSICAL EXAM: Vitals:   07/10/20 1355  BP: 115/74  Pulse: 81  Resp: 14  Temp: 98.1 F (36.7 C)  TempSrc: Oral  SpO2: 96%  Weight: 120 lb (54.4 kg)  Height: 5\' 7"  (1.702 m)    GENERAL: The patient is a well-nourished male, in no acute distress. The vital signs are documented above. CARDIOVASCULAR: Palpable radial pulses bilaterally.  Absent femoral pulses bilaterally PULMONARY: There is good air exchange  MUSCULOSKELETAL: There are no major deformities or cyanosis. NEUROLOGIC: No focal weakness or paresthesias are detected. SKIN: There are no ulcers or rashes noted. PSYCHIATRIC: The patient has a normal affect.  DATA:  None new  MEDICAL ISSUES: Stable overall.  Again discussed is moderate to severe chronic ischemia which she is tolerating.  He knows to notify immediately should he develop any worsening rest pain or tissue loss.  We will see him again in 6 months with ankle arm index at that time    07/12/20, MD [facility] Vascular and Vein Specialists of [facility] Tel [phone] Pager [phone]

## 2020-08-28 ENCOUNTER — Ambulatory Visit: Payer: Medicare Other | Admitting: Vascular Surgery

## 2021-01-08 ENCOUNTER — Ambulatory Visit: Payer: Medicare Other | Admitting: Vascular Surgery

## 2021-02-23 ENCOUNTER — Other Ambulatory Visit: Payer: Self-pay | Admitting: *Deleted

## 2021-02-23 DIAGNOSIS — I779 Disorder of arteries and arterioles, unspecified: Secondary | ICD-10-CM

## 2021-03-05 ENCOUNTER — Ambulatory Visit (INDEPENDENT_AMBULATORY_CARE_PROVIDER_SITE_OTHER): Payer: Medicare Other

## 2021-03-05 ENCOUNTER — Encounter: Payer: Self-pay | Admitting: Vascular Surgery

## 2021-03-05 ENCOUNTER — Ambulatory Visit (INDEPENDENT_AMBULATORY_CARE_PROVIDER_SITE_OTHER): Payer: Medicare Other | Admitting: Vascular Surgery

## 2021-03-05 ENCOUNTER — Other Ambulatory Visit: Payer: Self-pay

## 2021-03-05 VITALS — BP 104/69 | HR 87 | Temp 98.1°F | Resp 16 | Ht 67.0 in | Wt 128.0 lb

## 2021-03-05 DIAGNOSIS — I779 Disorder of arteries and arterioles, unspecified: Secondary | ICD-10-CM | POA: Diagnosis not present

## 2021-03-05 NOTE — Progress Notes (Signed)
Vascular and Vein Specialist of [place]  Patient name: Anthony Meadows MRN: [id number] DOB: June 13, 1951 Sex: male  REASON FOR VISIT: Continued follow-up peripheral vascular occlusive disease  HPI: Anthony Meadows is a 70 y.o. male here today for follow-up of his lower extremity arterial sufficiency.  He is here today with his wife.  He reports that his symptoms have become worse most particularly in his right foot.  He is having nightly rest pain.  He walks very little due to claudication.  Fortunately he has had no tissue loss.  Past Medical History:  Diagnosis Date  . AICD (automatic cardioverter/defibrillator) present    new device - 08/2015, [employer] DEVICE  . Anxiety   . CHF (congestive heart failure) ([facility])   . Complication of anesthesia    "takes him awhile to come out of it" (08/14/2016)  . COPD (chronic obstructive pulmonary disease) ([facility])   . Coronary artery disease   . Hypercholesterolemia   . Hypertension   . Ischemic cardiomyopathy   . Myocardial infarction [facility]) 2002   "before heart OR"  . On home oxygen therapy    "2L; only when I need it" (08/14/2016)  . Persistent atrial fibrillation ([facility])   . Pre-diabetes    "put him on RX then took him off earlier this year" (08/14/2016)  . PVD (peripheral vascular disease) ([facility])     History reviewed. No pertinent family history.  SOCIAL HISTORY: Social History   Tobacco Use  . Smoking status: Current Every Day Smoker    Packs/day: 0.25    Years: 52.00    Pack years: 13.00    Types: Cigarettes    Last attempt to quit: 08/04/2016    Years since quitting: 4.5  . Smokeless tobacco: Never Used  . Tobacco comment: Uses nicotin inhaler  Substance Use Topics  . Alcohol use: No    Allergies  Allergen Reactions  . Amiodarone Other (See Comments)    Amio-induced hyperthyroidism    . Metformin Diarrhea and Nausea And Vomiting  . Metformin And Related Diarrhea and Nausea And Vomiting   . Penicillins Hives and Swelling    Did it involve swelling of the face/tongue/throat, SOB, or low BP? Yes Did it involve sudden or severe rash/hives, skin peeling, or any reaction on the inside of your mouth or nose? No Did you need to seek medical attention at a hospital or doctor's office? No When did it last happen? More than 10 years ago If all above answers are "NO", may proceed with cephalosporin use.    Current Outpatient Medications  Medication Sig Dispense Refill  . albuterol (PROVENTIL HFA;VENTOLIN HFA) 108 (90 Base) MCG/ACT inhaler Inhale 2 puffs into the lungs every 6 (six) hours as needed for wheezing or shortness of breath.     Marland Kitchen aspirin EC 81 MG tablet Take 81 mg by mouth daily. Swallow whole.    Marland Kitchen atorvastatin (LIPITOR) 80 MG tablet Take 80 mg by mouth at bedtime.     . Cholecalciferol (VITAMIN D3) 10000 units TABS Take 10,000 Units by mouth daily after breakfast.     . digoxin (LANOXIN) 0.125 MG tablet Take 125 mcg by mouth every other day.    Marland Kitchen FARXIGA 10 MG TABS tablet Take 10 mg by mouth daily.    . ferrous sulfate 325 (65 FE) MG EC tablet Take 1 tablet by mouth daily.    Marland Kitchen gabapentin (NEURONTIN) 100 MG capsule Take 100 mg by mouth 2 (two) times daily.    . hydrALAZINE (  APRESOLINE) 25 MG tablet Take 25 mg by mouth 2 (two) times daily.    . isosorbide dinitrate (ISORDIL) 30 MG tablet Take 30 mg by mouth daily.    Marland Kitchen LORazepam (ATIVAN) 0.5 MG tablet Take 0.5 mg by mouth 2 (two) times daily.    . metoprolol succinate (TOPROL-XL) 50 MG 24 hr tablet     . Rivaroxaban (XARELTO) 15 MG TABS tablet Take 1 tablet (15 mg total) by mouth daily with supper. 42 tablet   . spironolactone (ALDACTONE) 25 MG tablet Take 25 mg by mouth daily.    . SYMBICORT 160-4.5 MCG/ACT inhaler SMARTSIG:2 Puff(s) By Mouth Twice Daily    . torsemide (DEMADEX) 20 MG tablet Take 40 mg by mouth daily.     No current facility-administered medications for this visit.    REVIEW OF SYSTEMS:  [X]  denotes  positive finding, [ ]  denotes negative finding Cardiac  Comments:  Chest pain or chest pressure: x   Shortness of breath upon exertion: x   Short of breath when lying flat: x   Irregular heart rhythm: x       Vascular    Pain in calf, thigh, or hip brought on by ambulation: x   Pain in feet at night that wakes you up from your sleep:  x   Blood clot in your veins:    Leg swelling:           PHYSICAL EXAM: Vitals:   03/05/21 1020  BP: 104/69  Pulse: 87  Resp: 16  Temp: 98.1 F (36.7 C)  TempSrc: Other (Comment)  SpO2: 98%  Weight: 128 lb (58.1 kg)  Height: 5\' 7"  (1.702 m)    GENERAL: The patient is a well-nourished male, in no acute distress. The vital signs are documented above. CARDIOVASCULAR: 2+ radial pulses bilaterally.  He does have a easily palpable axillofemoral graft running on the right side of his chest which has no pulse.  No pulse on this femorofemoral bypass.  Both feet are cool with no pulses.  No tissue loss PULMONARY: There is good air exchange  MUSCULOSKELETAL: There are no major deformities or cyanosis. NEUROLOGIC: No focal weakness or paresthesias are detected. SKIN: There are no ulcers or rashes noted. PSYCHIATRIC: The patient has a normal affect.  DATA:  Noninvasive studies today reveal a drop in his right ankle Imvanex 0.38 where it was 0.466 months ago.  His left ankle arm index is 0.52 and was 0.56 six months ago  MEDICAL ISSUES: Had a very long discussion with the patient and his wife.  He reports that his claudication is very severe and rest pain has become progressive.  I discussed options with him.  Unfortunately he does have renal insufficiency with a creatinine of 2.1.  Explained that any treatment would require initial imaging studies.  He in all likelihood would require redo axillofemoral femorofemoral bypass.  He has had very poor durability of this dating back to 18 years ago with his initial surgery in .  He would like to  continue observation I feel this is appropriate.  I again stressed to him that if he developed any tissue loss on his foot that we would recommend urgent treatment.  He will continue to monitor his symptoms.  He will notify 03/07/21 sooner if he is unable to tolerate his discomfort.  Otherwise we will see him again in 6 months with repeat noninvasive studies    , MD [facility] Vascular and Vein Specialists of [place]  Office Tel [phone]  Note: Portions of this report may have been transcribed using voice recognition software.  Every effort has been made to ensure accuracy; however, inadvertent computerized transcription errors may still be present.

## 2021-05-09 ENCOUNTER — Other Ambulatory Visit: Payer: Self-pay

## 2021-05-09 ENCOUNTER — Telehealth: Payer: Self-pay

## 2021-05-09 DIAGNOSIS — I779 Disorder of arteries and arterioles, unspecified: Secondary | ICD-10-CM

## 2021-05-09 NOTE — Telephone Encounter (Signed)
Patient calls to report increasing pain in his legs. He was seen in office in May, and decision was made to monitor symptoms due to renal insuffiencey. He is s/p distant ax bi fem and fem fem bypass. The discomfort is becoming intolerable. Discussed situation with Dr. Arbie Cookey - we will schedule patient for CT angio abdomen and pelvis with runoff and follow up visit with MD at [facility] office to discuss the next steps. We will inform imaging of elevated Cr prior to patient arrival for procedure.

## 2021-05-22 ENCOUNTER — Ambulatory Visit (HOSPITAL_COMMUNITY)
Admission: RE | Admit: 2021-05-22 | Discharge: 2021-05-22 | Disposition: A | Discharge status: Home / Self Care | Payer: Medicare Other | Source: Ambulatory Visit | Attending: Vascular Surgery | Admitting: Vascular Surgery

## 2021-05-22 ENCOUNTER — Other Ambulatory Visit: Payer: Self-pay

## 2021-05-22 DIAGNOSIS — I779 Disorder of arteries and arterioles, unspecified: Secondary | ICD-10-CM | POA: Insufficient documentation

## 2021-05-22 LAB — POCT I-STAT CREATININE: Creatinine, Ser: 1.9 mg/dL — ABNORMAL HIGH (ref 0.61–1.24)

## 2021-05-22 MED ORDER — IOHEXOL 350 MG/ML SOLN
80.0000 mL | Freq: Once | INTRAVENOUS | Status: AC | PRN
  Administered 2021-05-22: 80 mL via INTRAVENOUS

## 2021-05-28 NOTE — Progress Notes (Signed)
Patient name: Anthony Meadows MRN: [id number] DOB: Sep 06, 1951 Sex: male  REASON FOR VISIT: Continued follow-up peripheral vascular occlusive disease  HPI: Anthony Meadows is a 70 y.o. male here today for follow-up of his lower extremity arterial sufficiency.  He is here today with his wife.  He reports that his symptoms have become worse most particularly in his right foot.  He is having nightly rest pain.  He walks very little due to claudication.  Fortunately he has had no tissue loss.  05/29/21: Patient returns for follow-up evaluation.  He has unfortunately clinically deteriorated.  He now has severe ischemic rest pain of his right foot.  The pain keeps him up most nights.  He is not able to sleep.  Thankfully he has no ischemic ulceration.  He reports claudication type symptoms on the left leg.  Past Medical History:  Diagnosis Date   AICD (automatic cardioverter/defibrillator) present    new device - 08/2015, [employer] DEVICE   Anxiety    CHF (congestive heart failure) (HCC)    Complication of anesthesia    "takes him awhile to come out of it" (08/14/2016)   COPD (chronic obstructive pulmonary disease) (HCC)    Coronary artery disease    Hypercholesterolemia    Hypertension    Ischemic cardiomyopathy    Myocardial infarction (HCC) 2002   "before heart OR"   On home oxygen therapy    "2L; only when I need it" (08/14/2016)   Persistent atrial fibrillation (HCC)    Pre-diabetes    "put him on RX then took him off earlier this year" (08/14/2016)   PVD (peripheral vascular disease) [facility]    Past Surgical History:  Procedure Laterality Date   ANGIOPLASTY / STENTING FEMORAL Right ~2012   CARDIAC CATHETERIZATION  2002   CARDIAC DEFIBRILLATOR PLACEMENT  01/2008   Medtronic ; Virtuoso VR defibrillator; Model D154VWC; Serial [id number]   CORONARY ANGIOPLASTY     CORONARY ARTERY BYPASS GRAFT  2002   'CABG X5"   FEMORAL-FEMORAL BYPASS GRAFT Bilateral  08/16/2016   Procedure: BYPASS GRAFT FEMORAL-FEMORAL ARTERY USING X 30CM HEMASHIELD GRAFT;  Surgeon: Larina Earthly, MD;  Location: [facility] OR;  Service: Vascular;  Laterality: Bilateral;   FEMORAL-POPLITEAL BYPASS GRAFT Right 08/04/2019   Procedure: THROMBECTOMY Ree Kida AND FEM FEM BYPASS;  Surgeon: Larina Earthly, MD;  Location: [facility] OR;  Service: Vascular;  Laterality: Right;   PATCH ANGIOPLASTY Right 04/12/2015   Procedure: PATCH ANGIOPLASTY Right Femoral Artery;  Surgeon: Larina Earthly, MD;  Location: [facility] OR;  Service: Vascular;  Laterality: Right;   THROMBECTOMY FEMORAL ARTERY Right 04/12/2015   Procedure: THROMBECTOMY Right Axilla-Femoral Gortex Graft;  Surgeon: Larina Earthly, MD;  Location: [facility] OR;  Service: Vascular;  Laterality: Right;   THROMBECTOMY FEMORAL ARTERY Right 06/21/2016   Procedure: RIGHT AXILLARY-FEMORAL THROMBECTOMY;  Surgeon: Larina Earthly, MD;  Location: [facility] OR;  Service: Vascular;  Laterality: Right;   THROMBECTOMY FEMORAL ARTERY Right 08/16/2016   Procedure: THROMBECTOMY AXILLOFEMORAL BYPASS GRAFT;  Surgeon: Larina Earthly, MD;  Location: [facility] OR;  Service: Vascular;  Laterality: Right;     History reviewed. No pertinent family history.  SOCIAL HISTORY: Social History   Tobacco Use   Smoking status: Every Day    Packs/day: 0.25    Years: 52.00    Pack years: 13.00    Types: Cigarettes    Last attempt to quit: 08/04/2016    Years since quitting: 4.8   Smokeless tobacco: Never   Tobacco  comments:    Uses nicotin inhaler  Substance Use Topics   Alcohol use: No    Allergies  Allergen Reactions   Amiodarone Other (See Comments)    Amio-induced hyperthyroidism     Metformin Diarrhea and Nausea And Vomiting   Metformin And Related Diarrhea and Nausea And Vomiting   Penicillins Hives and Swelling    Did it involve swelling of the face/tongue/throat, SOB, or low BP? Yes Did it involve sudden or severe rash/hives, skin peeling, or any reaction on the inside of your  mouth or nose? No Did you need to seek medical attention at a hospital or doctor's office? No When did it last happen? More than 10 years ago If all above answers are "NO", may proceed with cephalosporin use.    Current Outpatient Medications  Medication Sig Dispense Refill   albuterol (PROVENTIL HFA;VENTOLIN HFA) 108 (90 Base) MCG/ACT inhaler Inhale 2 puffs into the lungs every 6 (six) hours as needed for wheezing or shortness of breath.      aspirin EC 81 MG tablet Take 81 mg by mouth daily. Swallow whole.     Cholecalciferol (VITAMIN D3) 10000 units TABS Take 10,000 Units by mouth daily after breakfast.      digoxin (LANOXIN) 0.125 MG tablet Take 125 mcg by mouth every other day.     FARXIGA 10 MG TABS tablet Take 10 mg by mouth daily.     ferrous sulfate 325 (65 FE) MG EC tablet Take 1 tablet by mouth daily.     gabapentin (NEURONTIN) 100 MG capsule Take 100 mg by mouth 2 (two) times daily.     hydrALAZINE (APRESOLINE) 25 MG tablet Take 25 mg by mouth 2 (two) times daily.     isosorbide dinitrate (ISORDIL) 30 MG tablet Take 30 mg by mouth daily.     LORazepam (ATIVAN) 0.5 MG tablet Take 0.5 mg by mouth 2 (two) times daily.     metoprolol succinate (TOPROL-XL) 50 MG 24 hr tablet      Rivaroxaban (XARELTO) 15 MG TABS tablet Take 1 tablet (15 mg total) by mouth daily with supper. 42 tablet    spironolactone (ALDACTONE) 25 MG tablet Take 25 mg by mouth daily.     SYMBICORT 160-4.5 MCG/ACT inhaler SMARTSIG:2 Puff(s) By Mouth Twice Daily     torsemide (DEMADEX) 20 MG tablet Take 40 mg by mouth daily.     atorvastatin (LIPITOR) 20 MG tablet Take 20 mg by mouth daily.     No current facility-administered medications for this visit.    REVIEW OF SYSTEMS:  [X]  denotes positive finding, [ ]  denotes negative finding Cardiac  Comments:  Chest pain or chest pressure: x   Shortness of breath upon exertion: x   Short of breath when lying flat: x   Irregular heart rhythm: x       Vascular     Pain in calf, thigh, or hip brought on by ambulation: x   Pain in feet at night that wakes you up from your sleep:  x   Blood clot in your veins:    Leg swelling:           PHYSICAL EXAM: Vitals:   05/29/21 1408  BP: 121/67  Pulse: 73  Resp: 20  Temp: 97.9 F (36.6 C)  SpO2: 96%  Weight: 127 lb (57.6 kg)  Height: 5\' 7"  (1.702 m)     GENERAL: The patient is a well-nourished male, in no acute distress. The vital signs are documented  above. CARDIOVASCULAR: 2+ radial pulses bilaterally.  He does have a easily palpable axillofemoral graft running on the right side of his chest which has no pulse.  No pulse on this femorofemoral bypass.  Both feet are cool with no pulses.  No tissue loss PULMONARY: There is good air exchange  MUSCULOSKELETAL: There are no major deformities or cyanosis. NEUROLOGIC: No focal weakness or paresthesias are detected. SKIN: There are no ulcers or rashes noted. PSYCHIATRIC: The patient has a normal affect.  DATA:  Noninvasive studies today reveal a drop in his right ankle Imvanex 0.38 where it was 0.466 months ago.  His left ankle arm index is 0.52 and was 0.56 six months ago  CT angiogram personally reviewed.  Expectedly complex vascular anatomy.  He appears to have adequate inflow to the left common femoral artery, but he may have some external iliac stenosis.  Previous axillofemoral and femoral-femoral bypasses are thrombosed.  The bilateral common and profunda femoris arteries appear patent. Remarkably his right femoral-popliteal bypass is patent via collateral flow via the right profunda femoris artery.  ASSESSMENT / PLAN:  70 year old man with complex vascular history now with ischemic rest pain of the right lower extremity.  He needs an inflow procedure for symptomatic relief.  I think the simplest method of treating his symptoms would be an inflow procedure.  Left iliac stenting and femoral-femoral bypass would likely sufficient.  We will plan to do  this in the operating room as soon as time is available.  Rande Brunt. Lenell Antu, MD [facility] Phone Number: [phone] 05/29/2021 4:47 PM

## 2021-05-28 NOTE — H&P (View-Only) (Signed)
Patient name: Anthony Meadows MRN: [id number] DOB: Sep 06, 1951 Sex: male  REASON FOR VISIT: Continued follow-up peripheral vascular occlusive disease  HPI: Anthony Meadows is a 70 y.o. male here today for follow-up of his lower extremity arterial sufficiency.  He is here today with his wife.  He reports that his symptoms have become worse most particularly in his right foot.  He is having nightly rest pain.  He walks very little due to claudication.  Fortunately he has had no tissue loss.  05/29/21: Patient returns for follow-up evaluation.  He has unfortunately clinically deteriorated.  He now has severe ischemic rest pain of his right foot.  The pain keeps him up most nights.  He is not able to sleep.  Thankfully he has no ischemic ulceration.  He reports claudication type symptoms on the left leg.  Past Medical History:  Diagnosis Date   AICD (automatic cardioverter/defibrillator) present    new device - 08/2015, [employer] DEVICE   Anxiety    CHF (congestive heart failure) (HCC)    Complication of anesthesia    "takes him awhile to come out of it" (08/14/2016)   COPD (chronic obstructive pulmonary disease) (HCC)    Coronary artery disease    Hypercholesterolemia    Hypertension    Ischemic cardiomyopathy    Myocardial infarction (HCC) 2002   "before heart OR"   On home oxygen therapy    "2L; only when I need it" (08/14/2016)   Persistent atrial fibrillation (HCC)    Pre-diabetes    "put him on RX then took him off earlier this year" (08/14/2016)   PVD (peripheral vascular disease) [facility])    Past Surgical History:  Procedure Laterality Date   ANGIOPLASTY / STENTING FEMORAL Right ~2012   CARDIAC CATHETERIZATION  2002   CARDIAC DEFIBRILLATOR PLACEMENT  01/2008   Medtronic ; Virtuoso VR defibrillator; Model D154VWC; Serial [id number]   CORONARY ANGIOPLASTY     CORONARY ARTERY BYPASS GRAFT  2002   'CABG X5"   FEMORAL-FEMORAL BYPASS GRAFT Bilateral  08/16/2016   Procedure: BYPASS GRAFT FEMORAL-FEMORAL ARTERY USING X 30CM HEMASHIELD GRAFT;  Surgeon: Larina Earthly, MD;  Location: [facility] OR;  Service: Vascular;  Laterality: Bilateral;   FEMORAL-POPLITEAL BYPASS GRAFT Right 08/04/2019   Procedure: THROMBECTOMY Ree Kida AND FEM FEM BYPASS;  Surgeon: Larina Earthly, MD;  Location: [facility] OR;  Service: Vascular;  Laterality: Right;   PATCH ANGIOPLASTY Right 04/12/2015   Procedure: PATCH ANGIOPLASTY Right Femoral Artery;  Surgeon: Larina Earthly, MD;  Location: [facility] OR;  Service: Vascular;  Laterality: Right;   THROMBECTOMY FEMORAL ARTERY Right 04/12/2015   Procedure: THROMBECTOMY Right Axilla-Femoral Gortex Graft;  Surgeon: Larina Earthly, MD;  Location: [facility] OR;  Service: Vascular;  Laterality: Right;   THROMBECTOMY FEMORAL ARTERY Right 06/21/2016   Procedure: RIGHT AXILLARY-FEMORAL THROMBECTOMY;  Surgeon: Larina Earthly, MD;  Location: [facility] OR;  Service: Vascular;  Laterality: Right;   THROMBECTOMY FEMORAL ARTERY Right 08/16/2016   Procedure: THROMBECTOMY AXILLOFEMORAL BYPASS GRAFT;  Surgeon: Larina Earthly, MD;  Location: [facility] OR;  Service: Vascular;  Laterality: Right;     History reviewed. No pertinent family history.  SOCIAL HISTORY: Social History   Tobacco Use   Smoking status: Every Day    Packs/day: 0.25    Years: 52.00    Pack years: 13.00    Types: Cigarettes    Last attempt to quit: 08/04/2016    Years since quitting: 4.8   Smokeless tobacco: Never   Tobacco  comments:    Uses nicotin inhaler  Substance Use Topics   Alcohol use: No    Allergies  Allergen Reactions   Amiodarone Other (See Comments)    Amio-induced hyperthyroidism     Metformin Diarrhea and Nausea And Vomiting   Metformin And Related Diarrhea and Nausea And Vomiting   Penicillins Hives and Swelling    Did it involve swelling of the face/tongue/throat, SOB, or low BP? Yes Did it involve sudden or severe rash/hives, skin peeling, or any reaction on the inside of your  mouth or nose? No Did you need to seek medical attention at a hospital or doctor's office? No When did it last happen? More than 10 years ago If all above answers are "NO", may proceed with cephalosporin use.    Current Outpatient Medications  Medication Sig Dispense Refill   albuterol (PROVENTIL HFA;VENTOLIN HFA) 108 (90 Base) MCG/ACT inhaler Inhale 2 puffs into the lungs every 6 (six) hours as needed for wheezing or shortness of breath.      aspirin EC 81 MG tablet Take 81 mg by mouth daily. Swallow whole.     Cholecalciferol (VITAMIN D3) 10000 units TABS Take 10,000 Units by mouth daily after breakfast.      digoxin (LANOXIN) 0.125 MG tablet Take 125 mcg by mouth every other day.     FARXIGA 10 MG TABS tablet Take 10 mg by mouth daily.     ferrous sulfate 325 (65 FE) MG EC tablet Take 1 tablet by mouth daily.     gabapentin (NEURONTIN) 100 MG capsule Take 100 mg by mouth 2 (two) times daily.     hydrALAZINE (APRESOLINE) 25 MG tablet Take 25 mg by mouth 2 (two) times daily.     isosorbide dinitrate (ISORDIL) 30 MG tablet Take 30 mg by mouth daily.     LORazepam (ATIVAN) 0.5 MG tablet Take 0.5 mg by mouth 2 (two) times daily.     metoprolol succinate (TOPROL-XL) 50 MG 24 hr tablet      Rivaroxaban (XARELTO) 15 MG TABS tablet Take 1 tablet (15 mg total) by mouth daily with supper. 42 tablet    spironolactone (ALDACTONE) 25 MG tablet Take 25 mg by mouth daily.     SYMBICORT 160-4.5 MCG/ACT inhaler SMARTSIG:2 Puff(s) By Mouth Twice Daily     torsemide (DEMADEX) 20 MG tablet Take 40 mg by mouth daily.     atorvastatin (LIPITOR) 20 MG tablet Take 20 mg by mouth daily.     No current facility-administered medications for this visit.    REVIEW OF SYSTEMS:  [X]  denotes positive finding, [ ]  denotes negative finding Cardiac  Comments:  Chest pain or chest pressure: x   Shortness of breath upon exertion: x   Short of breath when lying flat: x   Irregular heart rhythm: x       Vascular     Pain in calf, thigh, or hip brought on by ambulation: x   Pain in feet at night that wakes you up from your sleep:  x   Blood clot in your veins:    Leg swelling:           PHYSICAL EXAM: Vitals:   05/29/21 1408  BP: 121/67  Pulse: 73  Resp: 20  Temp: 97.9 F (36.6 C)  SpO2: 96%  Weight: 127 lb (57.6 kg)  Height: 5\' 7"  (1.702 m)     GENERAL: The patient is a well-nourished male, in no acute distress. The vital signs are documented  above. CARDIOVASCULAR: 2+ radial pulses bilaterally.  He does have a easily palpable axillofemoral graft running on the right side of his chest which has no pulse.  No pulse on this femorofemoral bypass.  Both feet are cool with no pulses.  No tissue loss PULMONARY: There is good air exchange  MUSCULOSKELETAL: There are no major deformities or cyanosis. NEUROLOGIC: No focal weakness or paresthesias are detected. SKIN: There are no ulcers or rashes noted. PSYCHIATRIC: The patient has a normal affect.  DATA:  Noninvasive studies today reveal a drop in his right ankle Imvanex 0.38 where it was 0.466 months ago.  His left ankle arm index is 0.52 and was 0.56 six months ago  CT angiogram personally reviewed.  Expectedly complex vascular anatomy.  He appears to have adequate inflow to the left common femoral artery, but he may have some external iliac stenosis.  Previous axillofemoral and femoral-femoral bypasses are thrombosed.  The bilateral common and profunda femoris arteries appear patent. Remarkably his right femoral-popliteal bypass is patent via collateral flow via the right profunda femoris artery.  ASSESSMENT / PLAN:  70 year old man with complex vascular history now with ischemic rest pain of the right lower extremity.  He needs an inflow procedure for symptomatic relief.  I think the simplest method of treating his symptoms would be an inflow procedure.  Left iliac stenting and femoral-femoral bypass would likely sufficient.  We will plan to do  this in the operating room as soon as time is available.  Rande Brunt. Lenell Antu, MD [facility] Phone Number: [phone] 05/29/2021 4:47 PM

## 2021-05-29 ENCOUNTER — Encounter: Payer: Self-pay | Admitting: Vascular Surgery

## 2021-05-29 ENCOUNTER — Other Ambulatory Visit: Payer: Self-pay

## 2021-05-29 ENCOUNTER — Ambulatory Visit (INDEPENDENT_AMBULATORY_CARE_PROVIDER_SITE_OTHER): Payer: Medicare Other | Admitting: Vascular Surgery

## 2021-05-29 VITALS — BP 121/67 | HR 73 | Temp 97.9°F | Resp 20 | Ht 67.0 in | Wt 127.0 lb

## 2021-05-29 DIAGNOSIS — I739 Peripheral vascular disease, unspecified: Secondary | ICD-10-CM

## 2021-05-30 ENCOUNTER — Other Ambulatory Visit: Payer: Self-pay | Admitting: Vascular Surgery

## 2021-05-30 MED ORDER — OXYCODONE-ACETAMINOPHEN 5-325 MG PO TABS
1.0000 | ORAL_TABLET | ORAL | 0 refills | Status: DC | PRN
Start: 2021-05-30 — End: 2021-06-05

## 2021-06-01 ENCOUNTER — Other Ambulatory Visit: Payer: Self-pay | Admitting: Vascular Surgery

## 2021-06-01 ENCOUNTER — Encounter (HOSPITAL_COMMUNITY): Payer: Self-pay | Admitting: Vascular Surgery

## 2021-06-01 NOTE — Progress Notes (Addendum)
Mr. Anthony Meadows was not available, I spoke to Anthony Meadows, patient wife and designated party to speak with.  Mr. Anthony Meadows has not had chest pain or shortness of breath unless he is walking a lot. Mrs. Anthony Meadows denies having any s/s of Covid in her household.  Patient has not had any known exposure to Covid.   Mr. Anthony Meadows was tested for Covid today.  Mr. Anthony Meadows has pre diabetes. Patient's on Farxiga- Mrs. Anthony Meadows said that patient's cardiologist put patient on it for his heart.  Mr. Anthony Meadows checks CBG's and they are in athe normal range per [name].  Mrs. Anthony Meadows reports that Mr. Anthony Meadows had labs drawn this week at PCP's office , Patient has a copy of labs and will bring it with him. I instriucted Mrs. Anthony Meadows to have Mr. Anthony Meadows not take Anthony Meadows on Sunday or Monday. I instructed patient to check CBG after awaking and every 2 hours until arrival  to the hospital.  I Instructed patient if CBG is less than 70 to take 4 Glucose Tablets or 1 tube of Glucose Gel or 1/2 cup of a clear juice. Recheck CBG in 15 minutes if CBG is not over 70 call, pre- op desk at [phone] for further instructions.  I ask Anthony Meadows to stop patient's vitamins and I gave hygiene instructions.  Last dose of Xarelto is today.

## 2021-06-03 NOTE — Anesthesia Preprocedure Evaluation (Addendum)
Anesthesia Evaluation  Patient identified by MRN, date of birth, ID band Patient awake    Reviewed: Allergy & Precautions, H&P , NPO status , Patient's Chart, lab work & pertinent test results  Airway Mallampati: III  TM Distance: >3 FB Neck ROM: Full    Dental no notable dental hx. (+) Poor Dentition, Dental Advisory Given   Pulmonary COPD,  COPD inhaler and oxygen dependent, Current Smoker and Patient abstained from smoking.,    Pulmonary exam normal breath sounds clear to auscultation       Cardiovascular Exercise Tolerance: Good hypertension, Pt. on medications and Pt. on home beta blockers + CAD, + Past MI, + Peripheral Vascular Disease and +CHF  + dysrhythmias Atrial Fibrillation + Cardiac Defibrillator  Rhythm:Regular Rate:Normal     Neuro/Psych Anxiety negative neurological ROS     GI/Hepatic negative GI ROS, Neg liver ROS,   Endo/Other  negative endocrine ROS  Renal/GU Renal InsufficiencyRenal disease  negative genitourinary   Musculoskeletal   Abdominal   Peds  Hematology negative hematology ROS (+)   Anesthesia Other Findings   Reproductive/Obstetrics negative OB ROS                            Anesthesia Physical Anesthesia Plan  ASA: 4  Anesthesia Plan: General   Post-op Pain Management:    Induction: Intravenous  PONV Risk Score and Plan: 2 and Ondansetron and Midazolam  Airway Management Planned: Oral ETT  Additional Equipment: Arterial line, CVP and Ultrasound Guidance Line Placement  Intra-op Plan:   Post-operative Plan: Extubation in OR and Possible Post-op intubation/ventilation  Informed Consent: I have reviewed the patients History and Physical, chart, labs and discussed the procedure including the risks, benefits and alternatives for the proposed anesthesia with the patient or authorized representative who has indicated his/her understanding and  acceptance.     Dental advisory given  Plan Discussed with: CRNA  Anesthesia Plan Comments:        Anesthesia Quick Evaluation

## 2021-06-04 ENCOUNTER — Inpatient Hospital Stay (HOSPITAL_COMMUNITY)
Admission: RE | Admit: 2021-06-04 | Discharge: 2021-06-05 | DRG: 253 | Disposition: A | Discharge status: Home / Self Care | Payer: Medicare Other | Attending: Vascular Surgery | Admitting: Vascular Surgery

## 2021-06-04 ENCOUNTER — Inpatient Hospital Stay (HOSPITAL_COMMUNITY): Payer: Medicare Other | Admitting: Anesthesiology

## 2021-06-04 ENCOUNTER — Encounter (HOSPITAL_COMMUNITY): Payer: Self-pay | Admitting: Vascular Surgery

## 2021-06-04 ENCOUNTER — Inpatient Hospital Stay (HOSPITAL_COMMUNITY): Payer: Medicare Other

## 2021-06-04 ENCOUNTER — Other Ambulatory Visit: Payer: Self-pay

## 2021-06-04 ENCOUNTER — Encounter (HOSPITAL_COMMUNITY)
Admission: RE | Disposition: A | Discharge status: Home / Self Care | Payer: Self-pay | Source: Home / Self Care | Attending: Vascular Surgery

## 2021-06-04 DIAGNOSIS — E7800 Pure hypercholesterolemia, unspecified: Secondary | ICD-10-CM | POA: Diagnosis present

## 2021-06-04 DIAGNOSIS — I255 Ischemic cardiomyopathy: Secondary | ICD-10-CM | POA: Diagnosis present

## 2021-06-04 DIAGNOSIS — I2510 Atherosclerotic heart disease of native coronary artery without angina pectoris: Secondary | ICD-10-CM | POA: Diagnosis present

## 2021-06-04 DIAGNOSIS — F17210 Nicotine dependence, cigarettes, uncomplicated: Secondary | ICD-10-CM | POA: Diagnosis present

## 2021-06-04 DIAGNOSIS — Z880 Allergy status to penicillin: Secondary | ICD-10-CM

## 2021-06-04 DIAGNOSIS — I70321 Atherosclerosis of unspecified type of bypass graft(s) of the extremities with rest pain, right leg: Secondary | ICD-10-CM | POA: Diagnosis present

## 2021-06-04 DIAGNOSIS — Z20822 Contact with and (suspected) exposure to covid-19: Secondary | ICD-10-CM | POA: Diagnosis present

## 2021-06-04 DIAGNOSIS — Z7901 Long term (current) use of anticoagulants: Secondary | ICD-10-CM

## 2021-06-04 DIAGNOSIS — Z7984 Long term (current) use of oral hypoglycemic drugs: Secondary | ICD-10-CM | POA: Diagnosis not present

## 2021-06-04 DIAGNOSIS — Z7982 Long term (current) use of aspirin: Secondary | ICD-10-CM

## 2021-06-04 DIAGNOSIS — R7303 Prediabetes: Secondary | ICD-10-CM | POA: Diagnosis present

## 2021-06-04 DIAGNOSIS — I4819 Other persistent atrial fibrillation: Secondary | ICD-10-CM | POA: Diagnosis present

## 2021-06-04 DIAGNOSIS — Z888 Allergy status to other drugs, medicaments and biological substances status: Secondary | ICD-10-CM | POA: Diagnosis not present

## 2021-06-04 DIAGNOSIS — Z951 Presence of aortocoronary bypass graft: Secondary | ICD-10-CM | POA: Diagnosis not present

## 2021-06-04 DIAGNOSIS — Z95810 Presence of automatic (implantable) cardiac defibrillator: Secondary | ICD-10-CM

## 2021-06-04 DIAGNOSIS — I70221 Atherosclerosis of native arteries of extremities with rest pain, right leg: Principal | ICD-10-CM | POA: Diagnosis present

## 2021-06-04 DIAGNOSIS — J449 Chronic obstructive pulmonary disease, unspecified: Secondary | ICD-10-CM | POA: Diagnosis present

## 2021-06-04 DIAGNOSIS — I70612 Atherosclerosis of nonbiological bypass graft(s) of the extremities with intermittent claudication, left leg: Secondary | ICD-10-CM | POA: Diagnosis not present

## 2021-06-04 DIAGNOSIS — Z7951 Long term (current) use of inhaled steroids: Secondary | ICD-10-CM | POA: Diagnosis not present

## 2021-06-04 DIAGNOSIS — I739 Peripheral vascular disease, unspecified: Secondary | ICD-10-CM | POA: Diagnosis present

## 2021-06-04 DIAGNOSIS — Z79899 Other long term (current) drug therapy: Secondary | ICD-10-CM | POA: Diagnosis not present

## 2021-06-04 DIAGNOSIS — F419 Anxiety disorder, unspecified: Secondary | ICD-10-CM | POA: Diagnosis present

## 2021-06-04 DIAGNOSIS — I252 Old myocardial infarction: Secondary | ICD-10-CM | POA: Diagnosis not present

## 2021-06-04 DIAGNOSIS — Z419 Encounter for procedure for purposes other than remedying health state, unspecified: Secondary | ICD-10-CM

## 2021-06-04 HISTORY — PX: INSERTION OF ILIAC STENT: SHX6256

## 2021-06-04 HISTORY — PX: FEMORAL-FEMORAL BYPASS GRAFT: SHX936

## 2021-06-04 LAB — POCT ACTIVATED CLOTTING TIME
Activated Clotting Time: 277 seconds
Activated Clotting Time: 294 seconds

## 2021-06-04 LAB — COMPREHENSIVE METABOLIC PANEL
ALT: 13 U/L (ref 0–44)
AST: 16 U/L (ref 15–41)
Albumin: 3.7 g/dL (ref 3.5–5.0)
Alkaline Phosphatase: 21 U/L — ABNORMAL LOW (ref 38–126)
Anion gap: 8 (ref 5–15)
BUN: 36 mg/dL — ABNORMAL HIGH (ref 8–23)
CO2: 27 mmol/L (ref 22–32)
Calcium: 9.3 mg/dL (ref 8.9–10.3)
Chloride: 101 mmol/L (ref 98–111)
Creatinine, Ser: 1.96 mg/dL — ABNORMAL HIGH (ref 0.61–1.24)
GFR, Estimated: 37 mL/min — ABNORMAL LOW (ref >60)
Glucose, Bld: 153 mg/dL — ABNORMAL HIGH (ref 70–99)
Potassium: 3.8 mmol/L (ref 3.5–5.1)
Sodium: 136 mmol/L (ref 135–145)
Total Bilirubin: 1.7 mg/dL — ABNORMAL HIGH (ref 0.3–1.2)
Total Protein: 6.3 g/dL — ABNORMAL LOW (ref 6.5–8.1)

## 2021-06-04 LAB — BLOOD GAS, ARTERIAL
Acid-Base Excess: 0 mmol/L (ref 0.0–2.0)
Bicarbonate: 24.4 mmol/L (ref 20.0–28.0)
FIO2: 21
O2 Saturation: 97.4 %
Patient temperature: 37
pCO2 arterial: 41.7 mmHg (ref 32.0–48.0)
pH, Arterial: 7.386 (ref 7.350–7.450)
pO2, Arterial: 95.7 mmHg (ref 83.0–108.0)

## 2021-06-04 LAB — URINALYSIS, ROUTINE W REFLEX MICROSCOPIC
Bacteria, UA: NONE SEEN
Bilirubin Urine: NEGATIVE
Glucose, UA: >=500 mg/dL — AB
Hgb urine dipstick: NEGATIVE
Ketones, ur: NEGATIVE mg/dL
Leukocytes,Ua: NEGATIVE
Nitrite: NEGATIVE
Protein, ur: NEGATIVE mg/dL
Specific Gravity, Urine: 1.017 (ref 1.005–1.030)
pH: 5 (ref 5.0–8.0)

## 2021-06-04 LAB — TYPE AND SCREEN
ABO/RH(D): O POS
Antibody Screen: NEGATIVE

## 2021-06-04 LAB — CBC
HCT: 49.2 % (ref 39.0–52.0)
Hemoglobin: 16 g/dL (ref 13.0–17.0)
MCH: 33 pg (ref 26.0–34.0)
MCHC: 32.5 g/dL (ref 30.0–36.0)
MCV: 101.4 fL — ABNORMAL HIGH (ref 80.0–100.0)
Platelets: 138 10*3/uL — ABNORMAL LOW (ref 150–400)
RBC: 4.85 MIL/uL (ref 4.22–5.81)
RDW: 13.7 % (ref 11.5–15.5)
WBC: 10.5 10*3/uL (ref 4.0–10.5)
nRBC: 0 % (ref 0.0–0.2)

## 2021-06-04 LAB — SARS CORONAVIRUS 2 BY RT PCR (HOSPITAL ORDER, PERFORMED IN ~~LOC~~ HOSPITAL LAB): SARS Coronavirus 2: NEGATIVE

## 2021-06-04 LAB — APTT: aPTT: 33 seconds (ref 24–36)

## 2021-06-04 LAB — SURGICAL PCR SCREEN
MRSA, PCR: NEGATIVE
Staphylococcus aureus: NEGATIVE

## 2021-06-04 LAB — PROTIME-INR
INR: 1.1 (ref 0.8–1.2)
Prothrombin Time: 14.4 seconds (ref 11.4–15.2)

## 2021-06-04 LAB — GLUCOSE, CAPILLARY: Glucose-Capillary: 165 mg/dL — ABNORMAL HIGH (ref 70–99)

## 2021-06-04 SURGERY — CREATION, BYPASS, ARTERIAL, FEMORAL TO FEMORAL, USING GRAFT
Anesthesia: General | Laterality: Left

## 2021-06-04 MED ORDER — VANCOMYCIN HCL IN DEXTROSE 1-5 GM/200ML-% IV SOLN
1000.0000 mg | INTRAVENOUS | Status: AC
  Administered 2021-06-04: 1000 mg via INTRAVENOUS
  Filled 2021-06-04: qty 200

## 2021-06-04 MED ORDER — DIPHENHYDRAMINE HCL 50 MG/ML IJ SOLN
INTRAMUSCULAR | Status: DC | PRN
  Administered 2021-06-04: 12.5 mg via INTRAVENOUS

## 2021-06-04 MED ORDER — SPIRONOLACTONE 25 MG PO TABS
25.0000 mg | ORAL_TABLET | Freq: Every morning | ORAL | Status: DC
  Administered 2021-06-05: 25 mg via ORAL
  Filled 2021-06-04: qty 1

## 2021-06-04 MED ORDER — ACETAMINOPHEN 325 MG PO TABS: 325.0000 mg | ORAL_TABLET | ORAL | Status: DC | PRN

## 2021-06-04 MED ORDER — DEXAMETHASONE SODIUM PHOSPHATE 10 MG/ML IJ SOLN
INTRAMUSCULAR | Status: DC | PRN
  Administered 2021-06-04: 5 mg via INTRAVENOUS

## 2021-06-04 MED ORDER — FENTANYL CITRATE (PF) 250 MCG/5ML IJ SOLN
INTRAMUSCULAR | Status: AC
  Filled 2021-06-04: qty 5

## 2021-06-04 MED ORDER — GUAIFENESIN-DM 100-10 MG/5ML PO SYRP: 15.0000 mL | ORAL_SOLUTION | ORAL | Status: DC | PRN

## 2021-06-04 MED ORDER — PROTAMINE SULFATE 10 MG/ML IV SOLN
INTRAVENOUS | Status: DC | PRN
  Administered 2021-06-04 (×5): 10 mg via INTRAVENOUS

## 2021-06-04 MED ORDER — MAGNESIUM SULFATE 2 GM/50ML IV SOLN
2.0000 g | Freq: Every day | INTRAVENOUS | Status: DC | PRN
Start: 2021-06-04 — End: 2021-06-05

## 2021-06-04 MED ORDER — MIDAZOLAM HCL 2 MG/2ML IJ SOLN
INTRAMUSCULAR | Status: AC
  Filled 2021-06-04: qty 2

## 2021-06-04 MED ORDER — CHLORHEXIDINE GLUCONATE 0.12 % MT SOLN
OROMUCOSAL | Status: AC
  Filled 2021-06-04: qty 15

## 2021-06-04 MED ORDER — NOREPINEPHRINE 4 MG/250ML-% IV SOLN
0.0000 ug/min | INTRAVENOUS | Status: AC
  Administered 2021-06-04: 5 ug/min via INTRAVENOUS
  Filled 2021-06-04: qty 250

## 2021-06-04 MED ORDER — VASOPRESSIN 20 UNIT/ML IV SOLN
INTRAVENOUS | Status: AC
  Filled 2021-06-04: qty 1

## 2021-06-04 MED ORDER — CHLORHEXIDINE GLUCONATE CLOTH 2 % EX PADS: 6.0000 | MEDICATED_PAD | Freq: Once | CUTANEOUS | Status: DC

## 2021-06-04 MED ORDER — HYDRALAZINE HCL 20 MG/ML IJ SOLN
5.0000 mg | INTRAMUSCULAR | Status: DC | PRN
Start: 2021-06-04 — End: 2021-06-05

## 2021-06-04 MED ORDER — ONDANSETRON HCL 4 MG/2ML IJ SOLN: 4.0000 mg | Freq: Four times a day (QID) | INTRAMUSCULAR | Status: DC | PRN

## 2021-06-04 MED ORDER — LABETALOL HCL 5 MG/ML IV SOLN: 10.0000 mg | INTRAVENOUS | Status: DC | PRN

## 2021-06-04 MED ORDER — SENNOSIDES-DOCUSATE SODIUM 8.6-50 MG PO TABS: 1.0000 | ORAL_TABLET | Freq: Every evening | ORAL | Status: DC | PRN

## 2021-06-04 MED ORDER — PANTOPRAZOLE SODIUM 40 MG PO TBEC
40.0000 mg | DELAYED_RELEASE_TABLET | Freq: Every day | ORAL | Status: DC
  Administered 2021-06-04 – 2021-06-05 (×2): 40 mg via ORAL
  Filled 2021-06-04 (×2): qty 1

## 2021-06-04 MED ORDER — PHENYLEPHRINE HCL-NACL 20-0.9 MG/250ML-% IV SOLN
INTRAVENOUS | Status: DC | PRN
  Administered 2021-06-04: 100 ug/min via INTRAVENOUS

## 2021-06-04 MED ORDER — TORSEMIDE 20 MG PO TABS
20.0000 mg | ORAL_TABLET | Freq: Two times a day (BID) | ORAL | Status: DC
  Administered 2021-06-04 – 2021-06-05 (×2): 20 mg via ORAL
  Filled 2021-06-04 (×2): qty 1

## 2021-06-04 MED ORDER — SODIUM CHLORIDE 0.9 % IV SOLN: INTRAVENOUS | Status: DC

## 2021-06-04 MED ORDER — MIDAZOLAM HCL 2 MG/2ML IJ SOLN
INTRAMUSCULAR | Status: DC | PRN
  Administered 2021-06-04: 2 mg via INTRAVENOUS

## 2021-06-04 MED ORDER — ETOMIDATE 2 MG/ML IV SOLN
INTRAVENOUS | Status: DC | PRN
  Administered 2021-06-04: 10 mg via INTRAVENOUS

## 2021-06-04 MED ORDER — METOPROLOL SUCCINATE ER 50 MG PO TB24
50.0000 mg | ORAL_TABLET | Freq: Every day | ORAL | Status: DC
  Administered 2021-06-04: 50 mg via ORAL
  Filled 2021-06-04: qty 1

## 2021-06-04 MED ORDER — ATORVASTATIN CALCIUM 10 MG PO TABS
20.0000 mg | ORAL_TABLET | Freq: Every day | ORAL | Status: DC
  Administered 2021-06-04: 20 mg via ORAL
  Filled 2021-06-04: qty 2

## 2021-06-04 MED ORDER — HYDRALAZINE HCL 50 MG PO TABS
50.0000 mg | ORAL_TABLET | Freq: Two times a day (BID) | ORAL | Status: DC
  Administered 2021-06-04 – 2021-06-05 (×2): 50 mg via ORAL
  Filled 2021-06-04 (×2): qty 1

## 2021-06-04 MED ORDER — GABAPENTIN 100 MG PO CAPS
100.0000 mg | ORAL_CAPSULE | Freq: Two times a day (BID) | ORAL | Status: DC | PRN
  Administered 2021-06-05: 100 mg via ORAL
  Filled 2021-06-04: qty 1

## 2021-06-04 MED ORDER — HEPARIN SODIUM (PORCINE) 1000 UNIT/ML IJ SOLN
INTRAMUSCULAR | Status: AC
  Filled 2021-06-04: qty 1

## 2021-06-04 MED ORDER — ISOSORBIDE DINITRATE 10 MG PO TABS
30.0000 mg | ORAL_TABLET | Freq: Every morning | ORAL | Status: DC
  Administered 2021-06-05: 30 mg via ORAL
  Filled 2021-06-04: qty 3

## 2021-06-04 MED ORDER — SUGAMMADEX SODIUM 200 MG/2ML IV SOLN
INTRAVENOUS | Status: DC | PRN
  Administered 2021-06-04: 200 mg via INTRAVENOUS

## 2021-06-04 MED ORDER — PROPOFOL 10 MG/ML IV BOLUS
INTRAVENOUS | Status: AC
  Filled 2021-06-04: qty 20

## 2021-06-04 MED ORDER — LIDOCAINE 2% (20 MG/ML) 5 ML SYRINGE
INTRAMUSCULAR | Status: DC | PRN
  Administered 2021-06-04: 80 mg via INTRAVENOUS

## 2021-06-04 MED ORDER — FENTANYL CITRATE (PF) 100 MCG/2ML IJ SOLN
INTRAMUSCULAR | Status: AC
  Filled 2021-06-04: qty 2

## 2021-06-04 MED ORDER — ALBUTEROL SULFATE (2.5 MG/3ML) 0.083% IN NEBU: 3.0000 mL | INHALATION_SOLUTION | Freq: Four times a day (QID) | RESPIRATORY_TRACT | Status: DC | PRN

## 2021-06-04 MED ORDER — LORAZEPAM 0.5 MG PO TABS
0.5000 mg | ORAL_TABLET | Freq: Two times a day (BID) | ORAL | Status: DC
  Administered 2021-06-04 – 2021-06-05 (×2): 0.5 mg via ORAL
  Filled 2021-06-04 (×2): qty 1

## 2021-06-04 MED ORDER — PHENOL 1.4 % MT LIQD: 1.0000 | OROMUCOSAL | Status: DC | PRN

## 2021-06-04 MED ORDER — LACTATED RINGERS IV SOLN: INTRAVENOUS | Status: DC | PRN

## 2021-06-04 MED ORDER — FERROUS SULFATE 325 (65 FE) MG PO TABS
325.0000 mg | ORAL_TABLET | Freq: Every morning | ORAL | Status: DC
  Administered 2021-06-05: 325 mg via ORAL
  Filled 2021-06-04: qty 1

## 2021-06-04 MED ORDER — ROCURONIUM BROMIDE 10 MG/ML (PF) SYRINGE
PREFILLED_SYRINGE | INTRAVENOUS | Status: DC | PRN
  Administered 2021-06-04: 30 mg via INTRAVENOUS
  Administered 2021-06-04: 70 mg via INTRAVENOUS

## 2021-06-04 MED ORDER — HEPARIN 6000 UNIT IRRIGATION SOLUTION
Status: DC | PRN
  Administered 2021-06-04: 1

## 2021-06-04 MED ORDER — OXYCODONE-ACETAMINOPHEN 5-325 MG PO TABS
ORAL_TABLET | ORAL | Status: AC
  Filled 2021-06-04: qty 1

## 2021-06-04 MED ORDER — ASPIRIN EC 81 MG PO TBEC
81.0000 mg | DELAYED_RELEASE_TABLET | Freq: Every morning | ORAL | Status: DC
  Administered 2021-06-05: 81 mg via ORAL
  Filled 2021-06-04: qty 1

## 2021-06-04 MED ORDER — ROCURONIUM BROMIDE 10 MG/ML (PF) SYRINGE
PREFILLED_SYRINGE | INTRAVENOUS | Status: AC
  Filled 2021-06-04: qty 20

## 2021-06-04 MED ORDER — SODIUM CHLORIDE 0.9 % IV SOLN: 500.0000 mL | Freq: Once | INTRAVENOUS | Status: DC | PRN

## 2021-06-04 MED ORDER — 0.9 % SODIUM CHLORIDE (POUR BTL) OPTIME
TOPICAL | Status: DC | PRN
  Administered 2021-06-04: 1000 mL

## 2021-06-04 MED ORDER — CHLORHEXIDINE GLUCONATE 0.12 % MT SOLN
15.0000 mL | Freq: Once | OROMUCOSAL | Status: AC
  Administered 2021-06-04: 15 mL via OROMUCOSAL
  Filled 2021-06-04: qty 15

## 2021-06-04 MED ORDER — ALBUMIN HUMAN 5 % IV SOLN: INTRAVENOUS | Status: DC | PRN

## 2021-06-04 MED ORDER — FENTANYL CITRATE (PF) 250 MCG/5ML IJ SOLN
INTRAMUSCULAR | Status: DC | PRN
  Administered 2021-06-04 (×3): 50 ug via INTRAVENOUS

## 2021-06-04 MED ORDER — ORAL CARE MOUTH RINSE: 15.0000 mL | Freq: Once | OROMUCOSAL | Status: AC

## 2021-06-04 MED ORDER — POTASSIUM CHLORIDE CRYS ER 20 MEQ PO TBCR: 20.0000 meq | EXTENDED_RELEASE_TABLET | Freq: Every day | ORAL | Status: DC | PRN

## 2021-06-04 MED ORDER — LIDOCAINE 2% (20 MG/ML) 5 ML SYRINGE
INTRAMUSCULAR | Status: AC
  Filled 2021-06-04: qty 10

## 2021-06-04 MED ORDER — HEPARIN SODIUM (PORCINE) 5000 UNIT/ML IJ SOLN
5000.0000 [IU] | Freq: Three times a day (TID) | INTRAMUSCULAR | Status: DC
  Administered 2021-06-05: 5000 [IU] via SUBCUTANEOUS
  Filled 2021-06-04: qty 1

## 2021-06-04 MED ORDER — OXYCODONE-ACETAMINOPHEN 5-325 MG PO TABS
1.0000 | ORAL_TABLET | ORAL | Status: DC | PRN
  Administered 2021-06-04: 2 via ORAL
  Administered 2021-06-04: 1 via ORAL
  Administered 2021-06-05 (×2): 2 via ORAL
  Filled 2021-06-04 (×3): qty 2

## 2021-06-04 MED ORDER — HEPARIN 6000 UNIT IRRIGATION SOLUTION
Status: AC
  Filled 2021-06-04: qty 500

## 2021-06-04 MED ORDER — VANCOMYCIN HCL IN DEXTROSE 1-5 GM/200ML-% IV SOLN
1000.0000 mg | Freq: Two times a day (BID) | INTRAVENOUS | Status: AC
  Administered 2021-06-04 – 2021-06-05 (×2): 1000 mg via INTRAVENOUS
  Filled 2021-06-04 (×2): qty 200

## 2021-06-04 MED ORDER — SUGAMMADEX SODIUM 200 MG/2ML IV SOLN: INTRAVENOUS | Status: DC | PRN

## 2021-06-04 MED ORDER — HEPARIN SODIUM (PORCINE) 1000 UNIT/ML IJ SOLN
INTRAMUSCULAR | Status: DC | PRN
  Administered 2021-06-04: 6000 [IU] via INTRAVENOUS

## 2021-06-04 MED ORDER — DAPAGLIFLOZIN PROPANEDIOL 10 MG PO TABS
10.0000 mg | ORAL_TABLET | Freq: Every morning | ORAL | Status: DC
  Administered 2021-06-05: 10 mg via ORAL
  Filled 2021-06-04: qty 1

## 2021-06-04 MED ORDER — BISACODYL 5 MG PO TBEC: 5.0000 mg | DELAYED_RELEASE_TABLET | Freq: Every day | ORAL | Status: DC | PRN

## 2021-06-04 MED ORDER — IOHEXOL 300 MG/ML  SOLN
INTRAMUSCULAR | Status: DC | PRN
  Administered 2021-06-04: 35 mL via INTRA_ARTERIAL

## 2021-06-04 MED ORDER — ALUM & MAG HYDROXIDE-SIMETH 200-200-20 MG/5ML PO SUSP: 15.0000 mL | ORAL | Status: DC | PRN

## 2021-06-04 MED ORDER — DIGOXIN 125 MCG PO TABS
125.0000 ug | ORAL_TABLET | ORAL | Status: DC
  Administered 2021-06-04: 125 ug via ORAL
  Filled 2021-06-04 (×2): qty 1

## 2021-06-04 MED ORDER — ACETAMINOPHEN 650 MG RE SUPP: 325.0000 mg | RECTAL | Status: DC | PRN

## 2021-06-04 MED ORDER — ONDANSETRON HCL 4 MG/2ML IJ SOLN
INTRAMUSCULAR | Status: DC | PRN
  Administered 2021-06-04: 4 mg via INTRAVENOUS

## 2021-06-04 MED ORDER — FENTANYL CITRATE (PF) 100 MCG/2ML IJ SOLN
25.0000 ug | INTRAMUSCULAR | Status: DC | PRN
  Administered 2021-06-04 (×4): 25 ug via INTRAVENOUS

## 2021-06-04 MED ORDER — METOPROLOL TARTRATE 5 MG/5ML IV SOLN: 2.0000 mg | INTRAVENOUS | Status: DC | PRN

## 2021-06-04 MED ORDER — ALBUTEROL SULFATE (2.5 MG/3ML) 0.083% IN NEBU: 2.5000 mg | INHALATION_SOLUTION | Freq: Four times a day (QID) | RESPIRATORY_TRACT | Status: DC | PRN

## 2021-06-04 MED ORDER — HYDROMORPHONE HCL 1 MG/ML IJ SOLN
0.5000 mg | INTRAMUSCULAR | Status: DC | PRN
  Administered 2021-06-04 – 2021-06-05 (×2): 1 mg via INTRAVENOUS
  Filled 2021-06-04 (×2): qty 1

## 2021-06-04 MED ORDER — HEMOSTATIC AGENTS (NO CHARGE) OPTIME
TOPICAL | Status: DC | PRN
  Administered 2021-06-04: 1 via TOPICAL

## 2021-06-04 MED ORDER — DOCUSATE SODIUM 100 MG PO CAPS
100.0000 mg | ORAL_CAPSULE | Freq: Every day | ORAL | Status: DC
  Administered 2021-06-05: 100 mg via ORAL
  Filled 2021-06-04: qty 1

## 2021-06-04 SURGICAL SUPPLY — 69 items
BAG COUNTER SPONGE SURGICOUNT (BAG) ×3 IMPLANT
BALLN MUSTANG 7.0X40 75 (BALLOONS) ×3
BALLOON MUSTANG 7.0X40 75 (BALLOONS) ×2 IMPLANT
CANISTER SUCT 3000ML PPV (MISCELLANEOUS) ×3 IMPLANT
CANNULA VESSEL 3MM 2 BLNT TIP (CANNULA) ×6 IMPLANT
CATH BEACON 5 .035 65 KMP TIP (CATHETERS) IMPLANT
CATH OMNI FLUSH .035X70CM (CATHETERS) ×3 IMPLANT
CATH OMNI FLUSH 5F 65CM (CATHETERS) ×3 IMPLANT
CLIP VESOCCLUDE MED 24/CT (CLIP) ×3 IMPLANT
CLIP VESOCCLUDE SM WIDE 24/CT (CLIP) ×3 IMPLANT
COVER PROBE W GEL 5X96 (DRAPES) ×3 IMPLANT
DERMABOND ADVANCED (GAUZE/BANDAGES/DRESSINGS) ×1
DERMABOND ADVANCED .7 DNX12 (GAUZE/BANDAGES/DRESSINGS) ×2 IMPLANT
DRAIN CHANNEL 15F RND FF W/TCR (WOUND CARE) IMPLANT
DRAPE C-ARM 42X72 X-RAY (DRAPES) ×3 IMPLANT
ELECT REM PT RETURN 9FT ADLT (ELECTROSURGICAL) ×3
ELECT SOLID GEL RDN PRO-PADZ (MISCELLANEOUS) ×3
ELECTRODE REM PT RTRN 9FT ADLT (ELECTROSURGICAL) ×2 IMPLANT
ELECTRODE SOLI GEL RDN PROPADZ (MISCELLANEOUS) ×2 IMPLANT
EVACUATOR SILICONE 100CC (DRAIN) IMPLANT
FILTER SMOKE EVAC ULPA (FILTER) IMPLANT
GAUZE SPONGE 4X4 12PLY STRL (GAUZE/BANDAGES/DRESSINGS) IMPLANT
GLOVE SURG POLYISO LF SZ8 (GLOVE) ×9 IMPLANT
GOWN STRL REUS W/ TWL LRG LVL3 (GOWN DISPOSABLE) ×8 IMPLANT
GOWN STRL REUS W/ TWL XL LVL3 (GOWN DISPOSABLE) ×6 IMPLANT
GOWN STRL REUS W/TWL LRG LVL3 (GOWN DISPOSABLE) ×4
GOWN STRL REUS W/TWL XL LVL3 (GOWN DISPOSABLE) ×3
GRAFT CV 60X8STRG TUBE KNTD (Vascular Products) ×2 IMPLANT
GRAFT HEMASHIELD 8MM (Vascular Products) ×1 IMPLANT
HEMOSTAT SNOW SURGICEL 2X4 (HEMOSTASIS) ×3 IMPLANT
INSERT FOGARTY SM (MISCELLANEOUS) ×3 IMPLANT
KIT BASIN OR (CUSTOM PROCEDURE TRAY) ×3 IMPLANT
KIT ENCORE 26 ADVANTAGE (KITS) ×3 IMPLANT
KIT MICROPUNCTURE NIT STIFF (SHEATH) ×3 IMPLANT
KIT TURNOVER KIT B (KITS) ×3 IMPLANT
NS IRRIG 1000ML POUR BTL (IV SOLUTION) ×6 IMPLANT
PACK ENDO MINOR (CUSTOM PROCEDURE TRAY) ×3 IMPLANT
PACK PERIPHERAL VASCULAR (CUSTOM PROCEDURE TRAY) ×3 IMPLANT
PAD ARMBOARD 7.5X6 YLW CONV (MISCELLANEOUS) ×6 IMPLANT
PENCIL SMOKE EVACUATOR (MISCELLANEOUS) ×3 IMPLANT
SET MICROPUNCTURE 5F STIFF (MISCELLANEOUS) ×3 IMPLANT
SHEATH BRITE TIP 8FR 23CM (SHEATH) ×3 IMPLANT
SHEATH PINNACLE 5F 10CM (SHEATH) ×3 IMPLANT
SHEATH PINNACLE 8F 10CM (SHEATH) ×3 IMPLANT
SPONGE SURGIFOAM ABS GEL 100 (HEMOSTASIS) IMPLANT
STENT VIABAHN 8X7.5X120 (Permanent Stent) ×3 IMPLANT
STOPCOCK MORSE 400PSI 3WAY (MISCELLANEOUS) IMPLANT
STRIP CLOSURE SKIN 1/2X4 (GAUZE/BANDAGES/DRESSINGS) IMPLANT
SUT MNCRL AB 4-0 PS2 18 (SUTURE) ×6 IMPLANT
SUT PROLENE 5 0 C 1 24 (SUTURE) ×12 IMPLANT
SUT PROLENE 6 0 BV (SUTURE) ×3 IMPLANT
SUT SILK 2 0 PERMA HAND 18 BK (SUTURE) IMPLANT
SUT SILK 2 0 SH (SUTURE) ×6 IMPLANT
SUT VIC AB 2-0 CT1 27 (SUTURE) ×2
SUT VIC AB 2-0 CT1 TAPERPNT 27 (SUTURE) ×4 IMPLANT
SUT VIC AB 3-0 SH 27 (SUTURE) ×2
SUT VIC AB 3-0 SH 27X BRD (SUTURE) ×4 IMPLANT
SYR MEDRAD MARK V 150ML (SYRINGE) IMPLANT
TOWEL GREEN STERILE (TOWEL DISPOSABLE) ×3 IMPLANT
TRAY FOLEY MTR SLVR 16FR STAT (SET/KITS/TRAYS/PACK) ×3 IMPLANT
TUBING HIGH PRESSURE 120CM (CONNECTOR) IMPLANT
UNDERPAD 30X36 HEAVY ABSORB (UNDERPADS AND DIAPERS) IMPLANT
WATER STERILE IRR 1000ML POUR (IV SOLUTION) ×3 IMPLANT
WIRE AMPLATZ SS-J .035X180CM (WIRE) IMPLANT
WIRE AMPLATZ SS-J .035X260CM (WIRE) IMPLANT
WIRE BENTSON .035X145CM (WIRE) IMPLANT
WIRE G V18X300CM (WIRE) ×3 IMPLANT
WIRE ROSEN-J .035X260CM (WIRE) ×3 IMPLANT
WIRE STARTER BENTSON 035X150 (WIRE) ×3 IMPLANT

## 2021-06-04 NOTE — Anesthesia Procedure Notes (Signed)
Central Venous Catheter Insertion Performed by: Gaynelle Adu, MD, anesthesiologist Start/End8/15/2022 8:05 AM, 06/04/2021 8:20 AM Patient location: Pre-op. Preanesthetic checklist: patient identified, IV checked, site marked, risks and benefits discussed, surgical consent, monitors and equipment checked, pre-op evaluation, timeout performed and anesthesia consent Position: Trendelenburg Lidocaine 1% used for infiltration and patient sedated Hand hygiene performed , maximum sterile barriers used  and Seldinger technique used Catheter size: 8 Fr Total catheter length 16. Central line was placed.Double lumen Procedure performed using ultrasound guided technique. Ultrasound Notes:anatomy identified, needle tip was noted to be adjacent to the nerve/plexus identified, no ultrasound evidence of intravascular and/or intraneural injection and image(s) printed for medical record Attempts: 1 Following insertion, dressing applied, line sutured and Biopatch. Post procedure assessment: blood return through all ports  Patient tolerated the procedure well with no immediate complications.

## 2021-06-04 NOTE — Interval H&P Note (Signed)
History and Physical Interval Note:  06/04/2021 7:10 AM  Anthony Meadows  has presented today for surgery, with the diagnosis of PAD.  The various methods of treatment have been discussed with the patient and family. After consideration of risks, benefits and other options for treatment, the patient has consented to  Procedure(s): FEMORAL TO FEMORAL BYPASS GRAFTING (Bilateral) POSSIBLE INSERTION OF RIGHT ILIAC STENT (Right) as a surgical intervention.  The patient's history has been reviewed, patient examined, no change in status, stable for surgery.  I have reviewed the patient's chart and labs.  Questions were answered to the patient's satisfaction.     Leonie Douglas

## 2021-06-04 NOTE — Anesthesia Procedure Notes (Signed)
Arterial Line Insertion Start/End8/15/2022 7:15 AM, 06/04/2021 7:20 AM Performed by: CRNA  Patient location: Pre-op. Preanesthetic checklist: patient identified, IV checked, site marked, risks and benefits discussed, surgical consent, monitors and equipment checked, pre-op evaluation, timeout performed and anesthesia consent Lidocaine 1% used for infiltration Right, radial was placed Catheter size: 20 G Hand hygiene performed  and maximum sterile barriers used   Attempts: 2 Procedure performed without using ultrasound guided technique. Following insertion, dressing applied and Biopatch. Post procedure assessment: normal  Patient tolerated the procedure well with no immediate complications.

## 2021-06-04 NOTE — Progress Notes (Signed)
Dr. Sampson Goon aware of AICD--  no new interventions at this time.

## 2021-06-04 NOTE — Progress Notes (Signed)
Patient arrived to 4 E 3 from PACU. Bilat groin sites level zero. VS stable. Patient placed on telemetry and CCMD notified. Call bell within reach, bed low and locked with bed alarm engaged.

## 2021-06-04 NOTE — Anesthesia Procedure Notes (Signed)
Procedure Name: Intubation Date/Time: 06/04/2021 8:05 AM Performed by: Marena Chancy, CRNA Pre-anesthesia Checklist: Patient identified, Emergency Drugs available, Suction available and Patient being monitored Patient Re-evaluated:Patient Re-evaluated prior to induction Oxygen Delivery Method: Circle System Utilized Preoxygenation: Pre-oxygenation with 100% oxygen Induction Type: IV induction and Cricoid Pressure applied Ventilation: Mask ventilation without difficulty and Oral airway inserted - appropriate to patient size Laryngoscope Size: Hyacinth Meeker and 2 Grade View: Grade I Tube type: Oral Tube size: 7.5 mm Number of attempts: 1 Airway Equipment and Method: Stylet and Oral airway Placement Confirmation: ETT inserted through vocal cords under direct vision, positive ETCO2 and breath sounds checked- equal and bilateral Tube secured with: Tape Dental Injury: Teeth and Oropharynx as per pre-operative assessment

## 2021-06-04 NOTE — Progress Notes (Signed)
  Progress Note    06/04/2021 4:00 PM Day of Surgery  Subjective:  no complaints   Vitals:   06/04/21 1505 06/04/21 1535  BP: (!) 133/53 131/69  Pulse: 76 80  Resp: (!) 22 19  Temp:    SpO2: 100% 99%   Physical Exam: Cardiac:  regular Lungs:  non labored Incisions:  bilateral groins intact, clean and dry. No swelling or hematoma Extremities:  well perfused and warm. Doppler PT/ DP signals bilaterally. Motor and sensation intact Abdomen:  soft, non tender, non distended Neurologic: alert and oriented  CBC    Component Value Date/Time   WBC 10.5 06/04/2021 0633   RBC 4.85 06/04/2021 0633   HGB 16.0 06/04/2021 0633   HCT 49.2 06/04/2021 0633   PLT 138 (L) 06/04/2021 0633   MCV 101.4 (H) 06/04/2021 0633   MCH 33.0 06/04/2021 0633   MCHC 32.5 06/04/2021 0633   RDW 13.7 06/04/2021 0633   LYMPHSABS 2.2 06/09/2016 1150   MONOABS 0.8 06/09/2016 1150   EOSABS 0.5 06/09/2016 1150   BASOSABS 0.0 06/09/2016 1150    BMET    Component Value Date/Time   NA 136 06/04/2021 0633   K 3.8 06/04/2021 0633   CL 101 06/04/2021 0633   CO2 27 06/04/2021 0633   GLUCOSE 153 (H) 06/04/2021 0633   BUN 36 (H) 06/04/2021 0633   CREATININE 1.96 (H) 06/04/2021 0633   CALCIUM 9.3 06/04/2021 0633   GFRNONAA 37 (L) 06/04/2021 0633   GFRAA 51 (L) 08/06/2019 0424    INR    Component Value Date/Time   INR 1.1 06/04/2021 0633     Intake/Output Summary (Last 24 hours) at 06/04/2021 1600 Last data filed at 06/04/2021 1117 Gross per 24 hour  Intake 2450 ml  Output 220 ml  Net 2230 ml     Assessment/Plan:  70 y.o. male is s/p  1) re-do bilateral femoral artery exposure 2) left external iliac artery angioplasty and stenting (8x34mm Viabahn) 3) left-to-right femoral-femoral bypass with 42mm Dacron   Incisions are c/d/I without swelling or hematoma BLE well perfused and warm with doppler pt/ dp signals Routine post op care To 4E when bed available   Graceann Congress, PA-C [facility] [phone] 06/04/2021 4:00 PM

## 2021-06-04 NOTE — Transfer of Care (Signed)
Immediate Anesthesia Transfer of Care Note  Patient: Anthony Meadows  Procedure(s) Performed: LEFT FEMORAL TO RIGHT FEMORAL BYPASS GRAFTING INSERTION OF LEFT EXTERNAL ILIAC STENT (Left)  Patient Location: PACU  Anesthesia Type:General  Level of Consciousness: drowsy and patient cooperative  Airway & Oxygen Therapy: Patient Spontanous Breathing  Post-op Assessment: Report given to RN and Post -op Vital signs reviewed and stable  Post vital signs: Reviewed and stable  Last Vitals:  Vitals Value Taken Time  BP    Temp    Pulse 85 06/04/21 1131  Resp 22 06/04/21 1131  SpO2 91 % 06/04/21 1131  Vitals shown include unvalidated device data.  Last Pain:  Vitals:   06/04/21 0645  PainSc: 7          Complications: No notable events documented.

## 2021-06-04 NOTE — Op Note (Signed)
DATE OF SERVICE: 06/04/2021  PATIENT:  Anthony Meadows  70 y.o. male  PRE-OPERATIVE DIAGNOSIS:  atherosclerosis of native and bypass arteries causing right leg rest pain and left leg claudication  POST-OPERATIVE DIAGNOSIS:  Same  PROCEDURE:   1) re-do bilateral femoral artery exposure 2) left external iliac artery angioplasty and stenting (8x59mm Viabahn) 3) left-to-right femoral-femoral bypass with 52mm Dacron   SURGEON:  Surgeon(s) and Role:    * Leonie Douglas, MD - Primary  ASSISTANT: Nathanial Rancher, PA-C  An assistant was required to facilitate exposure and expedite the case.  ANESTHESIA:   general  EBL:  BLOOD ADMINISTERED:none  DRAINS: none   LOCAL MEDICATIONS USED:  NONE  SPECIMEN:  none  COUNTS: confirmed correct .  TOURNIQUET:  none  PATIENT DISPOSITION:  PACU - hemodynamically stable.   Delay start of Pharmacological VTE agent (>24hrs) due to surgical blood loss or risk of bleeding: no  INDICATION FOR PROCEDURE: Anthony Meadows is a 70 y.o. male with ischemic rest pain of the right leg. He has a complex past vascular history (see H&P for full details). He is chronically ill with CAD, CHF, COPD, CKD. After careful discussion of risks, benefits, and alternatives the patient was offered left external iliac stenting and femoral-femoral bypass. We specifically discussed risk of anesthesia, risk of infection, risk of graft failure. The patient understood and wished to proceed.  OPERATIVE FINDINGS: Reexposed the left previous femoral-femoral bypass graft hood along with the femoral bifurcation and left external iliac artery.  Left external iliac artery stenting unremarkable.  Left to right redo femoral-femoral bypass.  Left anastomosis was created across the hood of the previous anastomosis onto the common femoral artery.  Right anastomosis was performed to previous femoral popliteal bypass which was remarkably still patent despite minimal inflow.  Significant  improvement in right leg flow after bypass.  DESCRIPTION OF PROCEDURE: After identification of the patient in the pre-operative holding area, the patient was transferred to the operating room. The patient was positioned supine on the operating room table. Anesthesia was induced. The abdomen, groins, thighs were prepped and draped in standard fashion. A surgical pause was performed confirming correct patient, procedure, and operative location.  Longitudinal groin incisions were reopened.  Incision was carried down through scar tissue on the left until hood of the previous left right femoral femoral bypass was encountered.  I then continued exposure onto the external iliac artery, common femoral artery, profunda femoris, and superficial femoral artery to complete the exposure.  Attention was turned to the right groin.  Ultrasound was used to identify the previous femoral popliteal bypass graft which was remarkably still patent on CT angiogram.  This was exposed through a small incision in the previous scar.  The incision was carried down through scar tissue until the bypass was encountered.  A Doppler machine was used to interrogate it.  This confirmed its patency.  Patient was systemically heparinized.  Activated clotting time measurements were used throughout the case to confirm it anticoagulation.  Micropuncture technique was used to access the head of the left femoral anastomosis.  A Rosen guidewire was advanced into the terminal aorta.  An 8 [place] by 35 cm sheath was introduced into the common iliac artery.  An Omni Flush catheter was advanced over the wire into the terminal aorta.  The sheath was withdrawn to the access point.  An angiogram was performed.  This identified severe left external iliac artery stenosis as anticipated.  A 8 x 75 mm Viabahn  stent was positioned at the origin of the external iliac artery, taking great care to avoid occluding the hypogastric artery.  The stent was deployed in  standard fashion.  The stent was postdilated with a 7 x 40 mm Mustang balloon.  Retrograde angiogram confirmed resolution of the external iliac stenosis.  A 8 mm tunneling device was used to connect the 2 exposures.  In 8 mm dacryon vascular graft was delivered through the tunnel to allow femoral-femoral bypass grafting.  Clamps were applied to the left external iliac artery, distal common femoral artery.  The endovascular equipment was removed from the access site.  The arteriotomy made by the sheath was extended with Potts scissors longitudinally on the hood of the previous femoral-femoral bypass graft and onto the native common femoral artery.  The proximal end of the vascular graft was then spatulated to allow generous end-to-side anastomosis.  This was performed with continuous running suture of 5-0 Prolene.  The anastomosis was completed.  The anastomosis was flushed down the open end of the vascular graft.  Good flow was confirmed.  A palpable pulse was noted in the graft.  The graft was clamped.    Gregory clamps were applied to the previous right femoral-popliteal bypass graft proximally and distally.  An anterior arteriotomy was made with 11 blade extended with Potts scissors.  The distal end of the left to right femoral-femoral bypass graft was spatulated to allow end-to-side anastomosis.  This was performed in continuous running suture using 5-0 Prolene in standard technique.  Immediately prior to completion the anastomosis was flushed and de-aired.  The anastomosis was completed.  Clamps were released.    A Doppler machine was used to interrogate the bypass.  Excellent flow was noted in the right femoral-popliteal bypass.  This attenuated with occlusion of the new femoral-femoral bypass graft.  Satisfied we ended the case here.  Hemostasis was achieved in the anastomoses.  Hemostasis was achieved in the surgical beds.  Heparin was reversed with protamine.  The wounds were closed in layers using  2-0 Vicryl, 3-0 Vicryl, 4-0 Monocryl.  Dermabond was applied.  Upon completion of the case instrument and sharps counts were confirmed correct. The patient was transferred to the PACU in good condition. I was present for all portions of the procedure.  Rande Brunt. Lenell Antu, MD [facility] Phone Number: [phone] 06/04/2021 11:42 AM

## 2021-06-04 NOTE — Anesthesia Postprocedure Evaluation (Signed)
Anesthesia Post Note  Patient: Anthony Meadows  Procedure(s) Performed: LEFT FEMORAL TO RIGHT FEMORAL BYPASS GRAFTING INSERTION OF LEFT EXTERNAL ILIAC STENT (Left)     Patient location during evaluation: PACU Anesthesia Type: General Level of consciousness: awake and alert Pain management: pain level controlled Vital Signs Assessment: post-procedure vital signs reviewed and stable Respiratory status: spontaneous breathing, nonlabored ventilation and respiratory function stable Cardiovascular status: blood pressure returned to baseline and stable Postop Assessment: no apparent nausea or vomiting Anesthetic complications: no   No notable events documented.  Last Vitals:  Vitals:   06/04/21 1235 06/04/21 1305  BP: (!) 142/52 (!) 122/56  Pulse: 65 69  Resp: 13 17  Temp: 36.8 C   SpO2: 100% 100%    Last Pain:  Vitals:   06/04/21 1141  PainSc: 8                  Ivanna Kocak,W. EDMOND

## 2021-06-05 ENCOUNTER — Encounter (HOSPITAL_COMMUNITY): Payer: Self-pay | Admitting: Vascular Surgery

## 2021-06-05 LAB — CBC
HCT: 40.9 % (ref 39.0–52.0)
Hemoglobin: 13.2 g/dL (ref 13.0–17.0)
MCH: 33.1 pg (ref 26.0–34.0)
MCHC: 32.3 g/dL (ref 30.0–36.0)
MCV: 102.5 fL — ABNORMAL HIGH (ref 80.0–100.0)
Platelets: 104 10*3/uL — ABNORMAL LOW (ref 150–400)
RBC: 3.99 MIL/uL — ABNORMAL LOW (ref 4.22–5.81)
RDW: 13.6 % (ref 11.5–15.5)
WBC: 13 10*3/uL — ABNORMAL HIGH (ref 4.0–10.5)
nRBC: 0 % (ref 0.0–0.2)

## 2021-06-05 LAB — BASIC METABOLIC PANEL
Anion gap: 7 (ref 5–15)
BUN: 30 mg/dL — ABNORMAL HIGH (ref 8–23)
CO2: 27 mmol/L (ref 22–32)
Calcium: 8.8 mg/dL — ABNORMAL LOW (ref 8.9–10.3)
Chloride: 101 mmol/L (ref 98–111)
Creatinine, Ser: 1.82 mg/dL — ABNORMAL HIGH (ref 0.61–1.24)
GFR, Estimated: 40 mL/min — ABNORMAL LOW (ref >60)
Glucose, Bld: 200 mg/dL — ABNORMAL HIGH (ref 70–99)
Potassium: 4.6 mmol/L (ref 3.5–5.1)
Sodium: 135 mmol/L (ref 135–145)

## 2021-06-05 LAB — HEPATIC FUNCTION PANEL
ALT: 10 U/L (ref 0–44)
AST: 16 U/L (ref 15–41)
Albumin: 3.3 g/dL — ABNORMAL LOW (ref 3.5–5.0)
Alkaline Phosphatase: 18 U/L — ABNORMAL LOW (ref 38–126)
Bilirubin, Direct: 0.3 mg/dL — ABNORMAL HIGH (ref 0.0–0.2)
Indirect Bilirubin: 1.2 mg/dL — ABNORMAL HIGH (ref 0.3–0.9)
Total Bilirubin: 1.5 mg/dL — ABNORMAL HIGH (ref 0.3–1.2)
Total Protein: 5.4 g/dL — ABNORMAL LOW (ref 6.5–8.1)

## 2021-06-05 LAB — LIPID PANEL
Cholesterol: 105 mg/dL (ref 0–200)
HDL: 31 mg/dL — ABNORMAL LOW (ref >40)
LDL Cholesterol: 50 mg/dL (ref 0–99)
Total CHOL/HDL Ratio: 3.4 RATIO
Triglycerides: 122 mg/dL (ref <150)
VLDL: 24 mg/dL (ref 0–40)

## 2021-06-05 MED ORDER — OXYCODONE-ACETAMINOPHEN 5-325 MG PO TABS: 1.0000 | ORAL_TABLET | Freq: Four times a day (QID) | ORAL | 0 refills | Status: AC | PRN

## 2021-06-05 MED ORDER — CHLORHEXIDINE GLUCONATE CLOTH 2 % EX PADS: 6.0000 | MEDICATED_PAD | Freq: Every day | CUTANEOUS | Status: DC

## 2021-06-05 NOTE — Progress Notes (Addendum)
  Progress Note    06/05/2021 7:44 AM 1 Day Post-Op  Subjective:  groin incisions are sore but feet feel better than before surgery   Vitals:   06/05/21 0300 06/05/21 0400  BP: (!) 116/52 (!) 117/46  Pulse: (!) 53 (!) 42  Resp: 18 15  Temp: 97.8 F (36.6 C)   SpO2: 96% 96%   Physical Exam: Lungs:  non labored Incisions:  groin incisions are c/d/I without hematoma Extremities:  brisk ATA signals by doppler; palpable fem fem bypass pulse Neurologic: A&O  CBC    Component Value Date/Time   WBC 13.0 (H) 06/05/2021 0500   RBC 3.99 (L) 06/05/2021 0500   HGB 13.2 06/05/2021 0500   HCT 40.9 06/05/2021 0500   PLT 104 (L) 06/05/2021 0500   MCV 102.5 (H) 06/05/2021 0500   MCH 33.1 06/05/2021 0500   MCHC 32.3 06/05/2021 0500   RDW 13.6 06/05/2021 0500   LYMPHSABS 2.2 06/09/2016 1150   MONOABS 0.8 06/09/2016 1150   EOSABS 0.5 06/09/2016 1150   BASOSABS 0.0 06/09/2016 1150    BMET    Component Value Date/Time   NA 136 06/04/2021 0633   K 3.8 06/04/2021 0633   CL 101 06/04/2021 0633   CO2 27 06/04/2021 0633   GLUCOSE 153 (H) 06/04/2021 0633   BUN 36 (H) 06/04/2021 0633   CREATININE 1.96 (H) 06/04/2021 0633   CALCIUM 9.3 06/04/2021 0633   GFRNONAA 37 (L) 06/04/2021 0633   GFRAA 51 (L) 08/06/2019 0424    INR    Component Value Date/Time   INR 1.1 06/04/2021 0633     Intake/Output Summary (Last 24 hours) at 06/05/2021 0744 Last data filed at 06/05/2021 0305 Gross per 24 hour  Intake 3391.51 ml  Output 220 ml  Net 3171.51 ml     Assessment/Plan:  70 y.o. male is s/p L EIA stent and fem fem bypass 1 Day Post-Op   BLE well perfused with patent fem fem bypass OOB this morning Possible discharge home this afternoon   Emilie Rutter, PA-C [facility] [phone] 06/05/2021 7:44 AM  VASCULAR STAFF ADDENDUM: I have independently interviewed and examined the patient. I agree with the above.  Looks great POD#1 fem-fem bypass and LEIA  stenting. Good doppler flow in both feet.  Symptomatic relief of rest pain. OK to discharge today if he feels confident walking.  Rande Brunt. Lenell Antu, MD [facility] Phone Number: [phone] 06/05/2021 12:08 PM

## 2021-06-05 NOTE — Progress Notes (Signed)
PHARMACIST LIPID MONITORING   Anthony Meadows is a 70 y.o. male admitted on 06/04/2021; now s/p L EIA stent and fem fem bypass 1 Day Post-Op.  Pharmacy has been consulted to optimize lipid-lowering therapy with the indication of secondary prevention for clinical ASCVD.  Recent Labs:  Lipid Panel (last 6 months):   Lab Results  Component Value Date   CHOL 105 06/05/2021   TRIG 122 06/05/2021   HDL 31 (L) 06/05/2021   CHOLHDL 3.4 06/05/2021   VLDL 24 06/05/2021   LDLCALC 50 06/05/2021    Hepatic function panel (last 6 months):   Lab Results  Component Value Date   AST 16 06/05/2021   ALT 10 06/05/2021   ALKPHOS 18 (L) 06/05/2021   BILITOT 1.5 (H) 06/05/2021   BILIDIR 0.3 (H) 06/05/2021   IBILI 1.2 (H) 06/05/2021    SCr (since admission):   Serum creatinine: 1.82 mg/dL (H) 08/16/24 3664 Estimated creatinine clearance: 31.2 mL/min (A)  Current therapy and lipid therapy tolerance Current lipid-lowering therapy: Atorvastatin 20 mg Previous lipid-lowering therapies (if applicable): atorvastatin 20 mg Documented or reported allergies or intolerances to lipid-lowering therapies (if applicable): none  Assessment:   LDL = 50 this AM  Plan:    1.Statin intensity (high intensity recommended for all patients regardless of the LDL):  Add or increase statin to high intensity. - Could consider increasing to atorvastatin 40 mg.  2.Add ezetimibe (if any one of the following):   Not indicated at this time.  3.Refer to lipid clinic:   No  4.Follow-up with:  Primary care provider - Vanessa [place], FNP  5.Follow-up labs after discharge:  No changes in lipid therapy, repeat a lipid panel in one year.      Reece Leader, Colon Flattery, BCCP Clinical Pharmacist  06/05/2021 9:56 AM   [facility] pharmacy phone numbers are listed on [email or web address]

## 2021-06-05 NOTE — Progress Notes (Signed)
PT Cancellation Note  Patient Details Name: Anthony Meadows MRN: [id number] DOB: 02-04-1951   Cancelled Treatment:    Reason Eval/Treat Not Completed: Other (comment).  Pt is up walking with family and nursing, up the stairs with OT.  Will retry tomorrow to see if anything is a challenge, and discharge if not.   Ivar Drape 06/05/2021, 9:54 AM  Samul Dada, PT MS Acute Rehab Dept. Number: [facility] [id number] and [facility] [phone]

## 2021-06-05 NOTE — Evaluation (Signed)
Occupational Therapy Evaluation/Discharge Patient Details Name: Anthony Meadows MRN: [id number] DOB: 03/20/1951 Today's Date: 06/05/2021    History of Present Illness Pt is a 70 y/o male who presented with ischemic R LE pain. Pt underwent fem bypass grafting of B LE and R LE stenting on 8/15. PMH: AICD, CHF, COPD, HTN, MI, a fib, PVD, pre diabetes   Clinical Impression   PTA, pt lives with family and reports Independence with all daily tasks. Pt presents now with mild soreness post-op but moving well. Pt able to ambulate independently in hallway, manage flight of stairs and reports no issues with ADLs this AM. No further skilled therapy services needed at acute level.     Follow Up Recommendations  No OT follow up    Equipment Recommendations  None recommended by OT    Recommendations for Other Services       Precautions / Restrictions Precautions Precautions: Fall Restrictions Weight Bearing Restrictions: No      Mobility Bed Mobility               General bed mobility comments: sitting in chair in room    Transfers Overall transfer level: Independent Equipment used: None                  Balance Overall balance assessment: No apparent balance deficits (not formally assessed)                                         ADL either performed or assessed with clinical judgement   ADL Overall ADL's : Independent                                       General ADL Comments: ambulated halls with nursing staff and wife, donned pajama pants in prep for DC. Pt able to demo hallway mobility again without AD, as well as safe stair mgmt.     Vision Patient Visual Report: No change from baseline Vision Assessment?: No apparent visual deficits     Perception     Praxis      Pertinent Vitals/Pain Pain Assessment: Faces Faces Pain Scale: Hurts a little bit Pain Location: LEs Pain Descriptors / Indicators: Sore Pain  Intervention(s): Monitored during session;Premedicated before session     Hand Dominance Right   Extremity/Trunk Assessment Upper Extremity Assessment Upper Extremity Assessment: Overall WFL for tasks assessed   Lower Extremity Assessment Lower Extremity Assessment: Overall WFL for tasks assessed   Cervical / Trunk Assessment Cervical / Trunk Assessment: Normal   Communication Communication Communication: No difficulties   Cognition Arousal/Alertness: Awake/alert Behavior During Therapy: WFL for tasks assessed/performed Overall Cognitive Status: Within Functional Limits for tasks assessed                                     General Comments       Exercises     Shoulder Instructions      Home Living Family/patient expects to be discharged to:: Private residence Living Arrangements: Spouse/significant other Available Help at Discharge: Family;Available 24 hours/day Type of Home: House Home Access: Stairs to enter [employer] of Steps: 5 Entrance Stairs-Rails: Right;Left Home Layout: One level     Bathroom Shower/Tub: Walk-in shower  Bathroom Toilet: Standard                Prior Functioning/Environment Level of Independence: Independent        Comments: Independent with all daily tasks, no use of AD        OT Problem List:        OT Treatment/Interventions:      OT Goals(Current goals can be found in the care plan section) Acute Rehab OT Goals Patient Stated Goal: go home today OT Goal Formulation: All assessment and education complete, DC therapy  OT Frequency:     Barriers to D/C:            Co-evaluation              AM-PAC OT "6 Clicks" Daily Activity     Outcome Measure Help from another person eating meals?: None Help from another person taking care of personal grooming?: None Help from another person toileting, which includes using toliet, bedpan, or urinal?: None Help from another person bathing  (including washing, rinsing, drying)?: None Help from another person to put on and taking off regular upper body clothing?: None Help from another person to put on and taking off regular lower body clothing?: None 6 Click Score: 24   End of Session Nurse Communication: Mobility status;Other (comment) (question about Plavix)  Activity Tolerance: Patient tolerated treatment well Patient left: Other (comment);with family/visitor present (standing in room)  OT Visit Diagnosis: Pain Pain - part of body: Leg                Time: 5956-3875 OT Time Calculation (min): 10 min Charges:  OT General Charges $OT Visit: 1 Visit OT Evaluation $OT Eval Low Complexity: 1 Low  Bradd Canary, OTR/L Acute Rehab Services Office: [phone]   Lorre Munroe 06/05/2021, 9:59 AM

## 2021-06-05 NOTE — Discharge Instructions (Signed)
 [facility] [place]  Discharge instructions  Lower Extremity Bypass Surgery  Please refer to the following instruction for your post-procedure care. Your surgeon or physician assistant will discuss any changes with you.  Activity  You are encouraged to walk as much as you can. You can slowly return to normal activities during the month after your surgery. Avoid strenuous activity and heavy lifting until your doctor tells you it's OK. Avoid activities such as vacuuming or swinging a golf club. Do not drive until your doctor give the OK and you are no longer taking prescription pain medications. It is also normal to have difficulty with sleep habits, eating and bowel movement after surgery. These will go away with time.  Bathing/Showering  You may shower after you go home. Do not soak in a bathtub, hot tub, or swim until the incision heals completely.  Incision Care  Clean your incision with mild soap and water. Shower every day. Pat the area dry with a clean towel. You do not need a bandage unless otherwise instructed. Do not apply any ointments or creams to your incision. If you have open wounds you will be instructed how to care for them or a visiting nurse may be arranged for you. If you have staples or sutures along your incision they will be removed at your post-op appointment. You may have skin glue on your incision. Do not peel it off. It will come off on its own in about one week. If you have a great deal of moisture in your groin, use a gauze help keep this area dry.  Diet  Resume your normal diet. There are no special food restrictions following this procedure. A low fat/ low cholesterol diet is recommended for all patients with vascular disease. In order to heal from your surgery, it is CRITICAL to get adequate nutrition. Your body requires vitamins, minerals, and protein. Vegetables are the best source of vitamins and minerals. Vegetables also provide the  perfect balance of protein. Processed food has little nutritional value, so try to avoid this.  Medications  Resume taking all your medications unless your doctor or nurse practitioner tells you not to. If your incision is causing pain, you may take over-the-counter pain relievers such as acetaminophen (Tylenol). If you were prescribed a stronger pain medication, please aware these medication can cause nausea and constipation. Prevent nausea by taking the medication with a snack or meal. Avoid constipation by drinking plenty of fluids and eating foods with high amount of fiber, such as fruits, vegetables, and grains. Take Colase 100 mg (an over-the-counter stool softener) twice a day as needed for constipation. Do not take Tylenol if you are taking prescription pain medications.  Follow Up  Our office will schedule a follow up appointment 2-3 weeks following discharge.  Please call us immediately for any of the following conditions  Severe or worsening pain in your legs or feet while at rest or while walking Increase pain, redness, warmth, or drainage (pus) from your incision site(s) Fever of 101 degree or higher The swelling in your leg with the bypass suddenly worsens and becomes more painful than when you were in the hospital If you have been instructed to feel your graft pulse then you should do so every day. If you can no longer feel this pulse, call the office immediately. Not all patients are given this instruction.  Leg swelling is common after leg bypass surgery.  The swelling should improve over a few months   following surgery. To improve the swelling, you may elevate your legs above the level of your heart while you are sitting or resting. Your surgeon or physician assistant may ask you to apply an ACE wrap or wear compression (TED) stockings to help to reduce swelling.  Reduce your risk of vascular disease  Stop smoking. If you would like help call [facility] at 1-800-QUIT-NOW  (1-800-784-8669) or [facility] at 336-586-4000.  Manage your cholesterol Maintain a desired weight Control your diabetes weight Control your diabetes Keep your blood pressure down  If you have any questions, please call the office at [phone]   

## 2021-06-05 NOTE — Progress Notes (Signed)
OT Cancellation Note  Patient Details Name: Anthony Meadows MRN: [id number] DOB: Apr 08, 1951   Cancelled Treatment:    Reason Eval/Treat Not Completed: Other (comment) Pt eating breakfast at this time and RN about to provide pain meds. Will follow up for OT eval as schedule permits.  Lorre Munroe 06/05/2021, 7:59 AM

## 2021-06-05 NOTE — Progress Notes (Signed)
Discharge instructions provided to patient. Need for follow-up appointments, incision care and medications reviewed. All questions answered. IV removed. Patient to be escorted home by his wife.  Allegra Grana RN

## 2021-06-05 NOTE — TOC Benefit Eligibility Note (Signed)
Transition of Care [facility]) Benefit Eligibility Note    Patient Details  Name: Keenon Leitzel MRN: [id number] Date of Birth: 06-Nov-1950   Medication/Dose: ELIQUIS  2.5 MG BID  (  Q/L TWO PER DAY )  Covered?: Yes  Tier: 3 Drug  Prescription Coverage Preferred Pharmacy: [facility] with Person/Company/Phone Number:: JACKET  @ [facility] RX #  [phone]  Co-Pay: $ 132.51  Prior Approval: No  Deductible:  (NO DEDUCTIBLE WITH PLAN / OUT-OF-POCKET:UNMET)       Mardene Sayer Phone Number: 06/05/2021, 1:05 PM

## 2021-06-06 LAB — SARS CORONAVIRUS 2 (TAT 6-24 HRS): SARS Coronavirus 2: NEGATIVE

## 2021-06-06 NOTE — Discharge Summary (Signed)
Discharge Summary  Patient ID: Anthony Meadows [id number] 70 y.o. [birth year]  Admit date: 06/04/2021  Discharge date and time: 06/05/2021  1:12 PM   Admitting Physician: Leonie Douglas, MD   Discharge Physician: same  Admission Diagnoses: PAD (peripheral artery disease) (HCC) [I73.9]  Discharge Diagnoses: same  Admission Condition: fair  Discharged Condition: fair  Indication for Admission: Right lower extremity rest pain and left leg claudication  Hospital Course: Anthony Meadows is a 70 year old male who developed right lower extremity rest pain as well as left leg claudication.  He was brought in as an outpatient by Dr. Lenell Antu on 06/04/2021 and underwent left external iliac artery stenting with left to right femoral to femoral bypass.  He tolerated the procedure well and was admitted to the hospital postoperatively.  POD #1 bilateral lower extremities were well perfused with brisk Doppler signals.  Patient ambulated well with therapy teams who did not recommend any placement.  Groin incisions appeared unremarkable and patient was ready for discharge home.  He was prescribed 2 to 3 days narcotic pain medication for continued postoperative pain control.  He will follow-up in office in about 2 to 3 weeks to evaluate circulation as well as check groin incisions.  He was discharged home in stable condition.  Consults: None  Treatments: surgery: Left external iliac artery stenting with left to right femoral to femoral bypass by Dr. Lenell Antu on 06/04/2021  Discharge Exam: See progress note 06/05/21 Vitals:   06/05/21 0749 06/05/21 1107  BP: (!) 110/55 112/65  Pulse: 68 87  Resp: 20 18  Temp: 98 F (36.7 C) 97.7 F (36.5 C)  SpO2: 95% 97%     Disposition: Discharge disposition: 01-Home or Self Care       Patient Instructions:  Allergies as of 06/05/2021       Reactions   Amiodarone Other (See Comments)   Amio-induced hyperthyroidism   Metformin Diarrhea, Nausea And  Vomiting   Metformin And Related Diarrhea, Nausea And Vomiting   Penicillins Hives, Swelling   Did it involve swelling of the face/tongue/throat, SOB, or low BP? Yes Did it involve sudden or severe rash/hives, skin peeling, or any reaction on the inside of your mouth or nose? No Did you need to seek medical attention at a hospital or doctor's office? No When did it last happen? More than 10 years ago If all above answers are "NO", may proceed with cephalosporin use.        Medication List     TAKE these medications    albuterol 108 (90 Base) MCG/ACT inhaler Commonly known as: VENTOLIN HFA Inhale 2 puffs into the lungs every 6 (six) hours as needed for wheezing or shortness of breath (COPD).   albuterol (2.5 MG/3ML) 0.083% nebulizer solution Commonly known as: PROVENTIL Inhale 2.5 mg into the lungs every 6 (six) hours as needed for wheezing or shortness of breath.   aspirin EC 81 MG tablet Take 81 mg by mouth in the morning. Swallow whole.   atorvastatin 20 MG tablet Commonly known as: LIPITOR Take 20 mg by mouth at bedtime.   digoxin 0.125 MG tablet Commonly known as: LANOXIN Take 125 mcg by mouth every other day. In the morning   Farxiga 10 MG Tabs tablet Generic drug: dapagliflozin propanediol Take 10 mg by mouth in the morning.   ferrous sulfate 325 (65 FE) MG EC tablet Take 325 mg by mouth in the morning.   gabapentin 100 MG capsule Commonly known as: NEURONTIN Take 100 mg  by mouth 2 (two) times daily as needed (pain.).   hydrALAZINE 50 MG tablet Commonly known as: APRESOLINE Take 50 mg by mouth in the morning and at bedtime.   isosorbide dinitrate 30 MG tablet Commonly known as: ISORDIL Take 30 mg by mouth in the morning.   LORazepam 0.5 MG tablet Commonly known as: ATIVAN Take 0.5 mg by mouth 2 (two) times daily.   metoprolol succinate 50 MG 24 hr tablet Commonly known as: TOPROL-XL Take 50 mg by mouth in the morning and at bedtime.    oxyCODONE-acetaminophen 5-325 MG tablet Commonly known as: PERCOCET/ROXICET Take 1 tablet by mouth every 6 (six) hours as needed for severe pain. What changed: when to take this   Rivaroxaban 15 MG Tabs tablet Commonly known as: XARELTO Take 1 tablet (15 mg total) by mouth daily with supper.   spironolactone 25 MG tablet Commonly known as: ALDACTONE Take 25 mg by mouth in the morning.   torsemide 20 MG tablet Commonly known as: DEMADEX Take 20 mg by mouth 2 (two) times daily.   triamcinolone cream 0.1 % Commonly known as: KENALOG Apply 1 application topically 2 (two) times daily as needed (skin irritation).   Vitamin D-3 125 MCG (5000 UT) Tabs Take 5,000 Units by mouth in the morning.       Activity: activity as tolerated Diet: regular diet Wound Care: keep wound clean and dry  Follow-up with [facility] in 2 weeks.  Signed: Emilie Rutter, PA-C 06/06/2021 5:12 AM [facility] Office: [phone]

## 2021-06-10 ENCOUNTER — Encounter (HOSPITAL_COMMUNITY): Payer: Self-pay | Admitting: Vascular Surgery

## 2021-06-29 ENCOUNTER — Other Ambulatory Visit: Payer: Self-pay

## 2021-06-29 DIAGNOSIS — I739 Peripheral vascular disease, unspecified: Secondary | ICD-10-CM

## 2021-07-03 ENCOUNTER — Encounter: Payer: Self-pay | Admitting: Vascular Surgery

## 2021-07-03 ENCOUNTER — Ambulatory Visit (INDEPENDENT_AMBULATORY_CARE_PROVIDER_SITE_OTHER): Payer: Medicare Other | Admitting: Vascular Surgery

## 2021-07-03 ENCOUNTER — Other Ambulatory Visit: Payer: Self-pay

## 2021-07-03 ENCOUNTER — Ambulatory Visit (HOSPITAL_COMMUNITY)
Admission: RE | Admit: 2021-07-03 | Discharge: 2021-07-03 | Disposition: A | Discharge status: Home / Self Care | Payer: Medicare Other | Source: Ambulatory Visit | Attending: Vascular Surgery | Admitting: Vascular Surgery

## 2021-07-03 VITALS — BP 138/70 | HR 92 | Temp 97.9°F | Resp 20 | Ht 66.0 in | Wt 129.0 lb

## 2021-07-03 DIAGNOSIS — I739 Peripheral vascular disease, unspecified: Secondary | ICD-10-CM | POA: Diagnosis not present

## 2021-07-03 NOTE — Progress Notes (Signed)
[facility] [place] PROGRESS NOTE  ASSESSMENT / PLAN: Anthony Meadows is a 70 y.o. male status post left external iliac artery stenting (8 x 75 mm Viabahn); left to right femoral-femoral bypass grafting (8 mm Dacron) on 06/04/2021.  Patient has done remarkably well.  He reports near resolution of his symptoms.  He still has some neuropathic type pain about his right foot.  I counseled him to follow-up with his primary care physician about this.  He may need pain management assistance.  Patient lives close to [place] and prefers follow-up in [place], which we will arrange in the next few months.  I will see him as needed.  SUBJECTIVE: Doing well.  Rest pain has resolved.  Wounds in the groin have healed.  Still reports some neuropathic pain in his right forefoot.  OBJECTIVE: BP 138/70 (BP Location: Left Arm, Patient Position: Sitting, Cuff Size: Normal)   Pulse 92   Temp 97.9 F (36.6 C)   Resp 20   Ht 5\' 6"  (1.676 m)   Wt 129 lb (58.5 kg)   SpO2 97%   BMI 20.82 kg/m   No acute distress Regular rate and rhythm Unlabored Soft abdomen Groin incisions well-healed, bilateral femoral pulses Both feet warm, no palpable pedal pulses  CBC Latest Ref Rng & Units 06/05/2021 06/04/2021 08/07/2019  WBC 4.0 - 10.5 K/uL 13.0(H) 10.5 9.4  Hemoglobin 13.0 - 17.0 g/dL 08/09/2019 33.3 54.5)  Hematocrit 39.0 - 52.0 % 40.9 49.2 28.1(L)  Platelets 150 - 400 K/uL 104(L) 138(L) 134(L)     CMP Latest Ref Rng & Units 06/05/2021 06/04/2021 05/22/2021  Glucose 70 - 99 mg/dL 07/22/2021) 638(L) -  BUN 8 - 23 mg/dL 373(S) 28(J) -  Creatinine 0.61 - 1.24 mg/dL 68(T) 1.57(W) 6.20(B)  Sodium 135 - 145 mmol/L 135 136 -  Potassium 3.5 - 5.1 mmol/L 4.6 3.8 -  Chloride 98 - 111 mmol/L 101 101 -  CO2 22 - 32 mmol/L 27 27 -  Calcium 8.9 - 10.3 mg/dL 5.59(R) 9.3 -  Total Protein 6.5 - 8.1 g/dL 4.1(U) 6.3(L) -  Total Bilirubin 0.3 - 1.2 mg/dL 3.8(G) 5.3(M) -  Alkaline Phos 38 - 126 U/L 18(L) 21(L) -   AST 15 - 41 U/L 16 16 -  ALT 0 - 44 U/L 10 13 -    +-------+-----------+-----------+------------+------------+  ABI/TBIToday's ABIToday's TBIPrevious ABIPrevious TBI  +-------+-----------+-----------+------------+------------+  Right  0.53       0.46       0.38        0.25          +-------+-----------+-----------+------------+------------+  Left   0.5        0.31       0.52        0.53          +-------+-----------+-----------+------------+------------+   4.6(O. Rande Brunt, MD [facility] Phone Number: [phone] 07/03/2021 2:47 PM

## 2021-07-05 ENCOUNTER — Other Ambulatory Visit: Payer: Self-pay

## 2021-07-05 DIAGNOSIS — I739 Peripheral vascular disease, unspecified: Secondary | ICD-10-CM

## 2021-09-10 ENCOUNTER — Telehealth: Payer: Self-pay

## 2021-09-10 NOTE — Telephone Encounter (Signed)
Patient's wife calls today to ask about an earlier appt. Patient's left leg has continued to bother him and has gotten worse over the past couple of weeks. Says it hurts him from the top of his leg to his foot. Denies any wounds and says there is very little swelling. He is s/p fem fem bypass and left iliac stenting in August. Moved appt up.

## 2021-09-19 ENCOUNTER — Other Ambulatory Visit: Payer: Self-pay

## 2021-09-19 ENCOUNTER — Ambulatory Visit (INDEPENDENT_AMBULATORY_CARE_PROVIDER_SITE_OTHER): Payer: Medicare Other

## 2021-09-19 ENCOUNTER — Ambulatory Visit (INDEPENDENT_AMBULATORY_CARE_PROVIDER_SITE_OTHER): Payer: Medicare Other | Admitting: Vascular Surgery

## 2021-09-19 ENCOUNTER — Encounter: Payer: Self-pay | Admitting: Vascular Surgery

## 2021-09-19 VITALS — BP 124/54 | HR 54 | Temp 98.4°F | Wt 131.2 lb

## 2021-09-19 DIAGNOSIS — I779 Disorder of arteries and arterioles, unspecified: Secondary | ICD-10-CM

## 2021-09-19 DIAGNOSIS — I739 Peripheral vascular disease, unspecified: Secondary | ICD-10-CM

## 2021-09-19 NOTE — Progress Notes (Signed)
[facility] [place]  Patient name: Anthony Meadows MRN: [id number] DOB: 1951/03/25 Sex: male  REASON FOR VISIT: Follow-up peripheral vascular occlusive disease  HPI: Anthony Meadows is a 70 y.o. male here today for follow-up.  He is here with his wife.  He has a long history of peripheral bypasses dating back 24 years with old ax bifemoral bypasses from [place].  He recently underwent revision with Dr. Lenell Antu on 06/04/2021.  At that time he underwent left external iliac artery stenting and left to right femorofemoral bypass.  He has usual complaints of cramping in both calves extending into his feet at night.  Also has chronic neuropathy.  Does not appear to have any claudication although he does very little walking.  He has a history of chronic cardiomyopathy and his wife reports that he is 2D echocardiogram coming with a concern regarding further the decreased EF.  Past Medical History:  Diagnosis Date   AICD (automatic cardioverter/defibrillator) present    new device - 08/2015, [employer] DEVICE   Anxiety    CHF (congestive heart failure) (HCC)    Complication of anesthesia    "takes him awhile to come out of it" (08/14/2016)   COPD (chronic obstructive pulmonary disease) (HCC)    Coronary artery disease    Hypercholesterolemia    Hypertension    Ischemic cardiomyopathy    Myocardial infarction (HCC) 2002   "before heart OR"   On home oxygen therapy    "2L; only when I need it" (08/14/2016)   Persistent atrial fibrillation (HCC)    Pre-diabetes    "put him on RX then took him off earlier this year" (08/14/2016)   PVD (peripheral vascular disease) (HCC)     History reviewed. No pertinent family history.  SOCIAL HISTORY: Social History   Tobacco Use   Smoking status: Every Day    Packs/day: 0.25    Years: 52.00    Pack years: 13.00    Types: Cigarettes    Last attempt to quit: 08/04/2016    Years since  quitting: 5.1   Smokeless tobacco: Never  Substance Use Topics   Alcohol use: No    Allergies  Allergen Reactions   Amiodarone Other (See Comments)    Amio-induced hyperthyroidism     Metformin Diarrhea and Nausea And Vomiting   Metformin And Related Diarrhea and Nausea And Vomiting   Penicillins Hives and Swelling    Did it involve swelling of the face/tongue/throat, SOB, or low BP? Yes Did it involve sudden or severe rash/hives, skin peeling, or any reaction on the inside of your mouth or nose? No Did you need to seek medical attention at a hospital or doctor's office? No When did it last happen? More than 10 years ago If all above answers are "NO", may proceed with cephalosporin use.    Current Outpatient Medications  Medication Sig Dispense Refill   albuterol (PROVENTIL HFA;VENTOLIN HFA) 108 (90 Base) MCG/ACT inhaler Inhale 2 puffs into the lungs every 6 (six) hours as needed for wheezing or shortness of breath (COPD).     albuterol (PROVENTIL) (2.5 MG/3ML) 0.083% nebulizer solution Inhale 2.5 mg into the lungs every 6 (six) hours as needed for wheezing or shortness of breath.     aspirin EC 81 MG tablet Take 81 mg by mouth in the morning. Swallow whole.     atorvastatin (LIPITOR) 20 MG tablet Take 20 mg by mouth at bedtime.     Cholecalciferol (VITAMIN D-3) 125 MCG (  5000 UT) TABS Take 5,000 Units by mouth in the morning.     digoxin (LANOXIN) 0.125 MG tablet Take 125 mcg by mouth every other day. In the morning     FARXIGA 10 MG TABS tablet Take 10 mg by mouth in the morning.     ferrous sulfate 325 (65 FE) MG EC tablet Take 325 mg by mouth in the morning.     gabapentin (NEURONTIN) 100 MG capsule Take 100 mg by mouth 2 (two) times daily as needed (pain.).     hydrALAZINE (APRESOLINE) 50 MG tablet Take 50 mg by mouth in the morning and at bedtime.     isosorbide dinitrate (ISORDIL) 30 MG tablet Take 30 mg by mouth in the morning.     LORazepam (ATIVAN) 0.5 MG tablet Take 0.5  mg by mouth 2 (two) times daily.     metoprolol succinate (TOPROL-XL) 50 MG 24 hr tablet Take 50 mg by mouth in the morning and at bedtime.     oxyCODONE-acetaminophen (PERCOCET/ROXICET) 5-325 MG tablet Take 1 tablet by mouth every 6 (six) hours as needed for severe pain. 20 tablet 0   Rivaroxaban (XARELTO) 15 MG TABS tablet Take 1 tablet (15 mg total) by mouth daily with supper. 42 tablet    spironolactone (ALDACTONE) 25 MG tablet Take 25 mg by mouth in the morning.     torsemide (DEMADEX) 20 MG tablet Take 20 mg by mouth 2 (two) times daily.     triamcinolone cream (KENALOG) 0.1 % Apply 1 application topically 2 (two) times daily as needed (skin irritation).     No current facility-administered medications for this visit.    REVIEW OF SYSTEMS:  [X]  denotes positive finding, [ ]  denotes negative finding Cardiac  Comments:  Chest pain or chest pressure:    Shortness of breath upon exertion:    Short of breath when lying flat:    Irregular heart rhythm:        Vascular    Pain in calf, thigh, or hip brought on by ambulation:    Pain in feet at night that wakes you up from your sleep:  x   Blood clot in your veins:    Leg swelling:           PHYSICAL EXAM: Vitals:   09/19/21 1508  BP: (!) 124/54  Pulse: (!) 54  Temp: 98.4 F (36.9 C)  TempSrc: Skin  SpO2: 95%  Weight: 131 lb 3.2 oz (59.5 kg)    GENERAL: The patient is a well-nourished male, in no acute distress. The vital signs are documented above. CARDIOVASCULAR: Easily palpable femorofemoral bypass and 2+ femoral pulses bilaterally.  Palpable right popliteal pulse.  Absent left popliteal and absent pedal pulses bilaterally PULMONARY: There is good air exchange  MUSCULOSKELETAL: There are no major deformities or cyanosis. NEUROLOGIC: No focal weakness or paresthesias are detected. SKIN: There are no ulcers or rashes noted. PSYCHIATRIC: The patient has a normal affect.  DATA:  Noninvasive studies reveal no change from  his studies done 3 months ago.  Ankle arm index is currently 0.5 bilaterally  MEDICAL ISSUES: Had long discussion with the patient and his wife.  Does not appear to have any evidence of critical limb ischemia.  His discomfort appears to mainly be neuropathic.  He will continue his current regimen.  He is on gabapentin.  We will see him again in 1 year with repeat noninvasive studies    Rosetta Posner, MD [facility]  of [employer] Tel [phone]  Note: Portions of this report may have been transcribed using voice recognition software.  Every effort has been made to ensure accuracy; however, inadvertent computerized transcription errors may still be present.

## 2021-10-03 ENCOUNTER — Ambulatory Visit: Payer: Medicare Other | Admitting: Vascular Surgery

## 2022-01-08 IMAGING — CT CT ANGIO AOBIFEM WO/W CM
2 of 8 series · 13 of 46 positions shown, 15 images · IV contrast (Omnipaque or Isovue)
Comparison: Prior CTA runoff 08/14/2016

CLINICAL DATA: Peripheral arterial disease, bilateral lower
extremity pain

EXAM:
CT ANGIOGRAPHY OF ABDOMINAL AORTA WITH ILIOFEMORAL RUNOFF
TECHNIQUE: Multidetector CT imaging of the abdomen, pelvis and lower
extremities was performed using the standard protocol during bolus
administration of intravenous contrast. Multiplanar CT image
reconstructions and MIPs were obtained to evaluate the vascular
anatomy.
CONTRAST:  80mL OMNIPAQUE IOHEXOL 350 MG/ML SOLN

[Series 4: arterial · axial · arterial · 0.80mm/px · z∈[+203,+1376]mm · 11 of 433 slices shown, 13 images]
[im 28/433  soft-tissue]
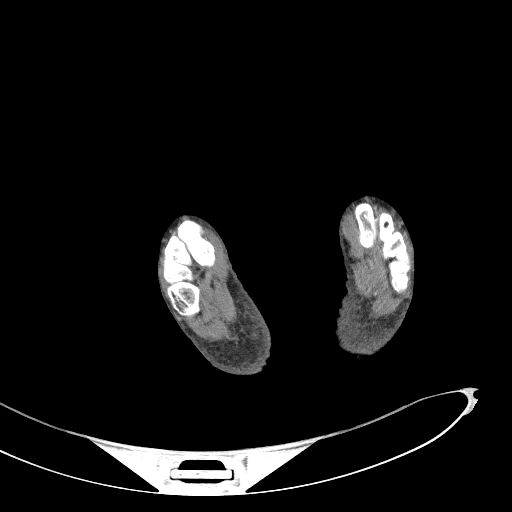
[im 28/433  bone]
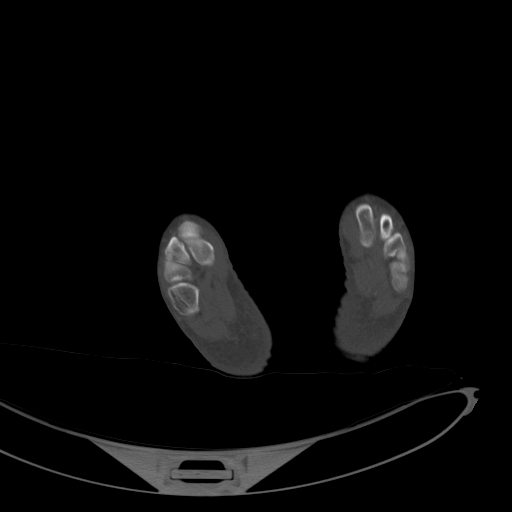
[im 84/433  soft-tissue]
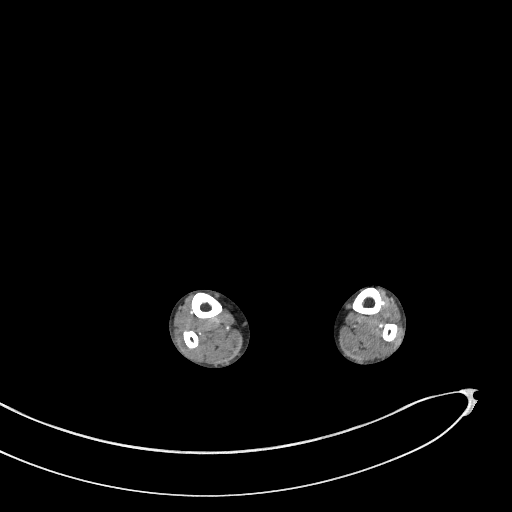
[im 140/433  soft-tissue]
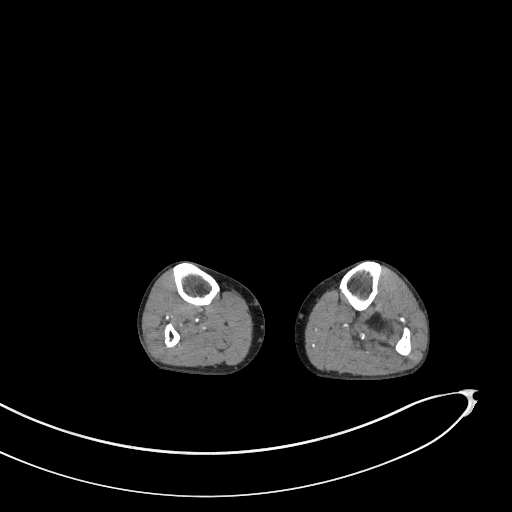
[im 196/433  soft-tissue]
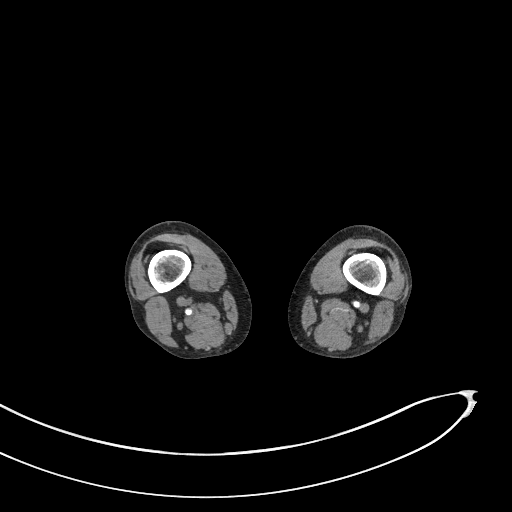
[im 237/433  soft-tissue]
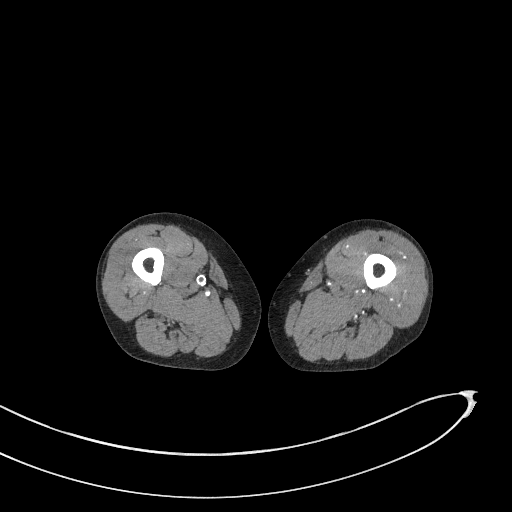
[im 293/433  soft-tissue]
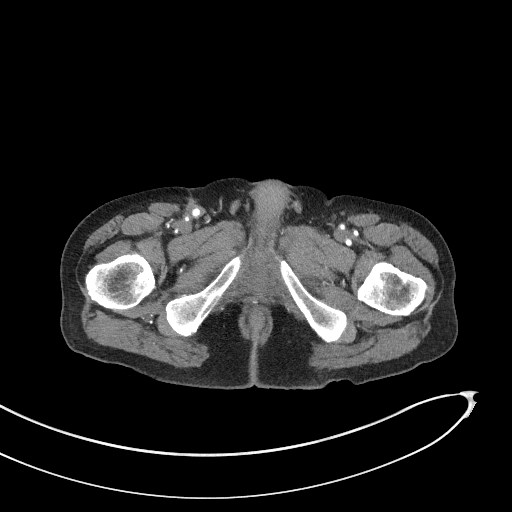
[im 349/433  soft-tissue]
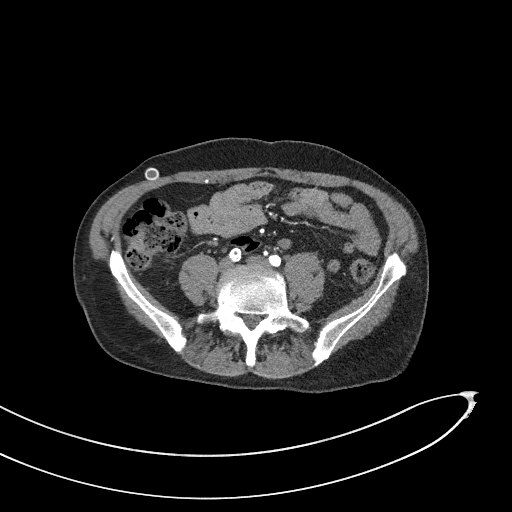
[im 377/433  lung]
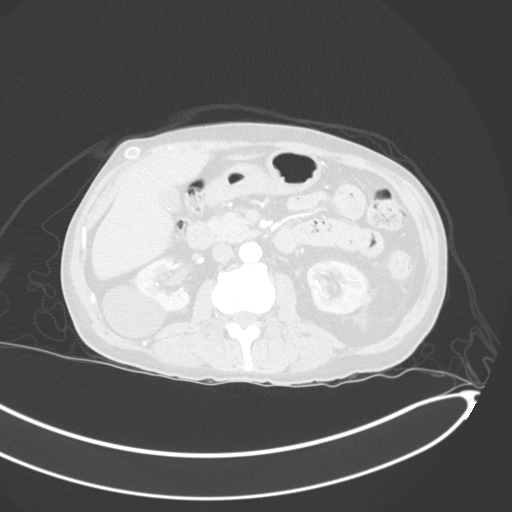
[im 391/433  lung]
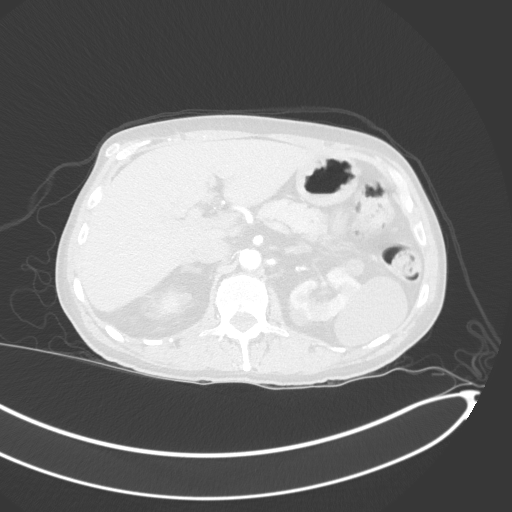
[im 405/433  soft-tissue]
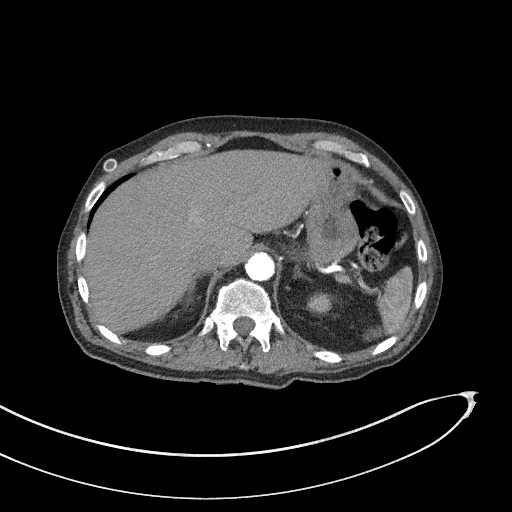
[im 405/433  lung]
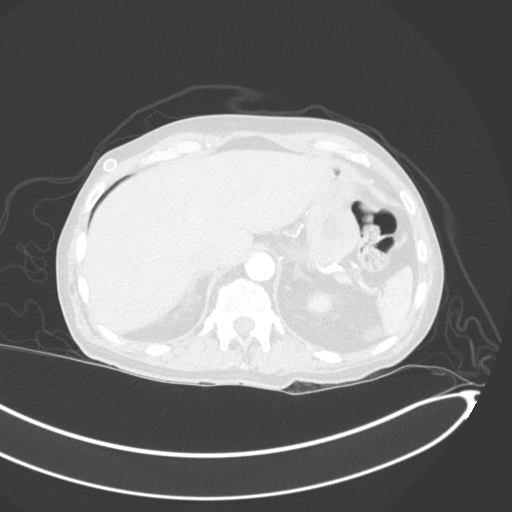
[im 419/433  lung]
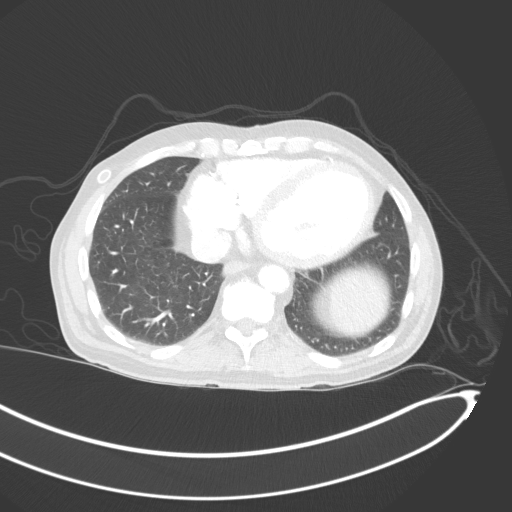

[Series 6: ap cor soft · coronal · 0.73mm/px · 2 of 125 slices shown]
[im 42/125  soft-tissue]
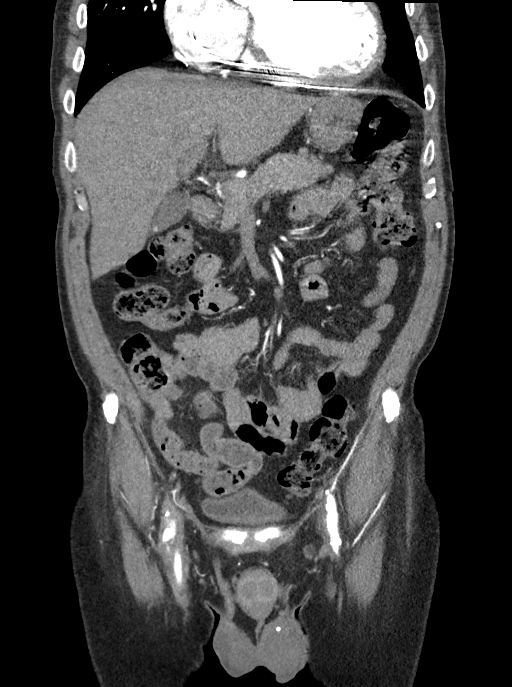
[im 83/125  soft-tissue]
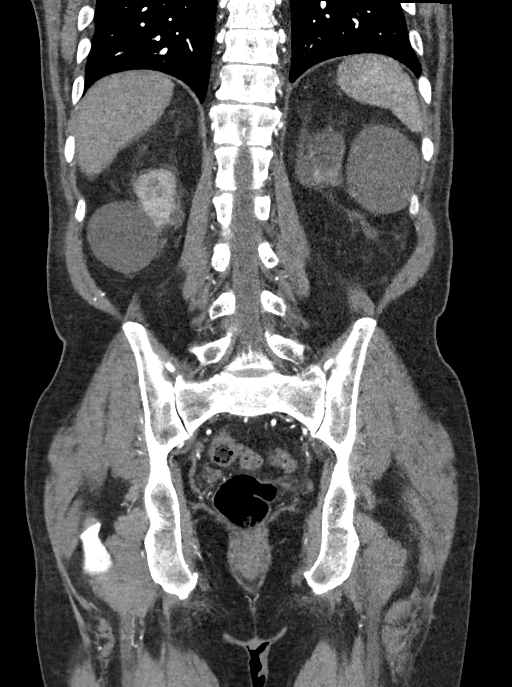

[13 of 46 positions shown; findings below may reference images not displayed]

FINDINGS: VASCULAR

Aorta: Heterogeneous atherosclerotic plaque throughout the aorta. No
evidence of aneurysm or dissection.

Celiac: Heterogeneous atherosclerotic plaque results in mild
stenosis at the origin the celiac artery. This is not likely to be
flow limiting. No aneurysm or dissection.

SMA: Fibrofatty atherosclerotic plaque results in mild but non
significant narrowing of the origin of the SMA. No aneurysm or
dissection.

Renals: Both renal arteries are patent without evidence of aneurysm,
dissection, vasculitis, fibromuscular dysplasia or significant
stenosis.

IMA: Patent without evidence of aneurysm, dissection, vasculitis or
significant stenosis.

RIGHT Lower Extremity

Inflow: Occluded stent system in the right common and external iliac
arteries. The internal iliac artery is also chronically occluded.
Occluded right ax fem and bilateral fem-fem bypass grafts. The right
ax fem bypass graft occlusion is chronic.

Outflow: Reconstitution of the distal common femoral artery just
below the anastomosis of the ax fem and fem-fem bypass grafts.
Chronic occlusion of the native SFA with multiple stents. There is a
femoral to below the knee bypass graft which remains patent. The P3
segment of the popliteal artery is also patent.

Runoff: Segmental occlusion of the anterior tibial artery just
beyond the origin with reconstitution. Similarly, the tibioperoneal
trunk occludes and then reconstitutes as it bifurcates into the
posterior tibial and peroneal arteries. The 3 runoff arteries then
remain patent to the ankle.

LEFT Lower Extremity

Inflow: Diffuse atherosclerotic and fibrofatty plaque without
significant stenosis in the common or external iliac arteries.
Moderate stenosis at the origin of the internal iliac artery.

Outflow: Occluded fem-fem bypass graft. The common femoral artery is
otherwise patent. The profunda femoral artery branches are widely
patent. The native superficial femoral artery is diffusely diseased
and occludes in the upper thigh. The artery reconstitutes in the
abductor canal. The P1 segment and P2 segments of the popliteal
artery are widely patent. The popliteal artery then occludes at the
P3 segment and does not reconstitute.

Runoff: Collateral branches provide supply to the anterior tibial
and peroneal arteries. The posterior tibial artery is chronically
occluded.

Veins: No focal venous abnormality.

Review of the MIP images confirms the above findings.

NON-VASCULAR

Lower chest: Cardiomegaly with significant left ventricular
dilation. Calcifications of the mitral valve annulus. Incompletely
imaged cardiac rhythm maintenance device leads terminating in the
right ventricle. No acute abnormality.

Hepatobiliary: Normal hepatic contour morphology. No discrete
hepatic lesion. Small high density foci within the gallbladder lumen
consistent with cholelithiasis. No evidence of acute cholecystitis.

Pancreas: Unremarkable. No pancreatic ductal dilatation or
surrounding inflammatory changes.

Spleen: No splenic injury or perisplenic hematoma.

Adrenals/Urinary Tract: Stable left adrenal adenoma. Unremarkable
right adrenal gland. Numerous circumscribed predominantly simple
cysts exophytic from both kidneys. Small amount of milk of calcium
layers within the largest cyst exophytic from the left kidney. No
hydronephrosis, nephrolithiasis or enhancing renal mass.
Unremarkable ureters and bladder.

Stomach/Bowel: Stomach is within normal limits. Appendix appears
normal. No evidence of bowel wall thickening, distention, or
inflammatory changes.

Lymphatic: No suspicious lymphadenopathy.

Reproductive: Prostate is unremarkable.

Other: No abdominal wall hernia or abnormality. No abdominopelvic
ascites.

Musculoskeletal: No acute fracture. Stable exostosis emanating from
the posterolateral aspect of the left proximal tibia and contiguous
with the proximal fibula. Ring and arc calcifications within the
lesion consistent with a cartilaginous cap. The lesion is contiguous
with the marrow space. Overall, the appearance is most consistent
with a large osteochondroma. There is no interval change in size or
appearance compared to the prior imaging from 0678.
IMPRESSION: VASCULAR

1. Compared to the prior CT scan from 08/14/2016 there has been an
interval left to right fem-fem bypass graft which is completely
occluded. Otherwise, the overall pattern of significant multilevel
occlusive peripheral arterial disease remains unchanged as detailed
above. Key findings enumerated below.
2. Chronically occluded right ax fem bypass graft.
3. Chronically occluded right common iliac, external iliac, common
femoral and native superficial femoral arteries.
4. Patent right femoral to below the knee popliteal bypass.
5. Short segmental occlusions of the proximal right anterior tibial
and tibioperoneal trunk with distal reconstitution. The 3 runoff
vessels then remain patent to the ankle.
6. Chronic occlusion of the left superficial femoral artery and
complete occlusion of the P3 segment of the left popliteal artery
without distal reconstitution. Collaterals provide reconstitution of
the anterior tibial and peroneal arteries.
7.  Aortic Atherosclerosis (OCIQU-DPC.C).
8. Cardiomegaly with significant left ventricular dilatation.

NON-VASCULAR

1. No acute abnormality in the abdomen or pelvis.
2. Multiple ancillary findings as above without significant interval
change compared to 0678.

## 2022-01-21 IMAGING — RF DG LUMBAR SPINE COMPLETE 4+V
1 series · 14 of 14 positions shown · IV contrast (agent unspecified)
Comparison: None.

CLINICAL DATA: LEFT iliac stent placement

EXAM:
DG C-ARM 1-60 MIN; LUMBAR SPINE - COMPLETE 4+ VIEW
CONTRAST:  None provided
FLUOROSCOPY TIME:  Fluoroscopy Time:  73.1 seconds
Radiation Exposure Index (if provided by the fluoroscopic device):
14.98 mGy
Number of Acquired Spot Images: 2

[Series 1: run · 5 acquisitions, 14 frames shown]
[im 1/5]
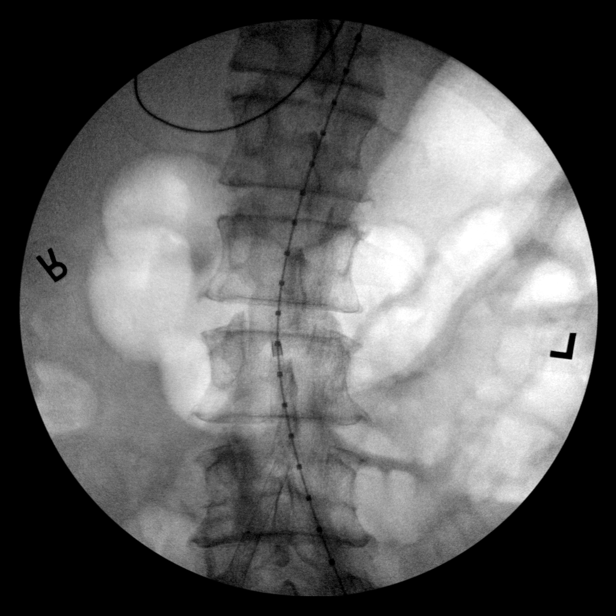
[im 2/5]
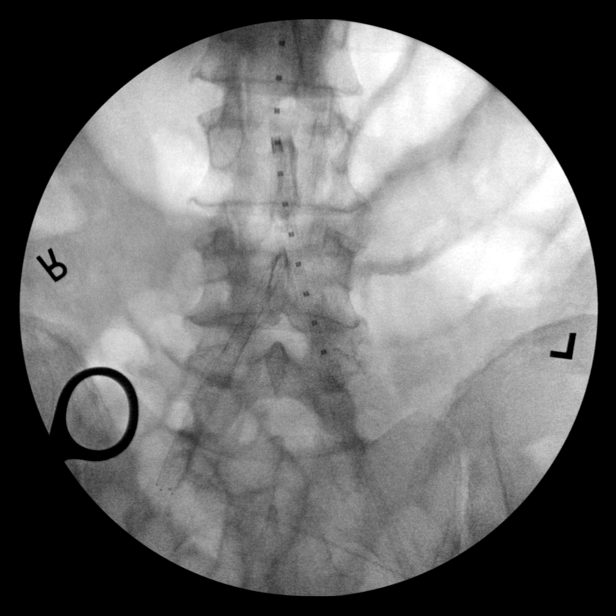
[im 3/5]
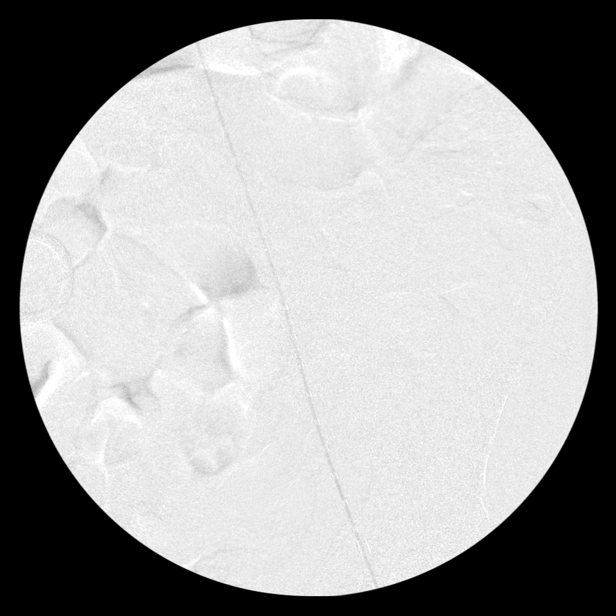
[im 3/5]
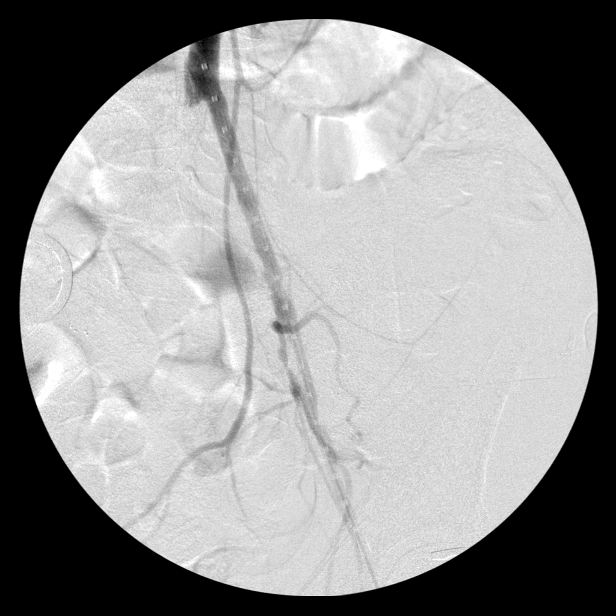
[im 3/5]
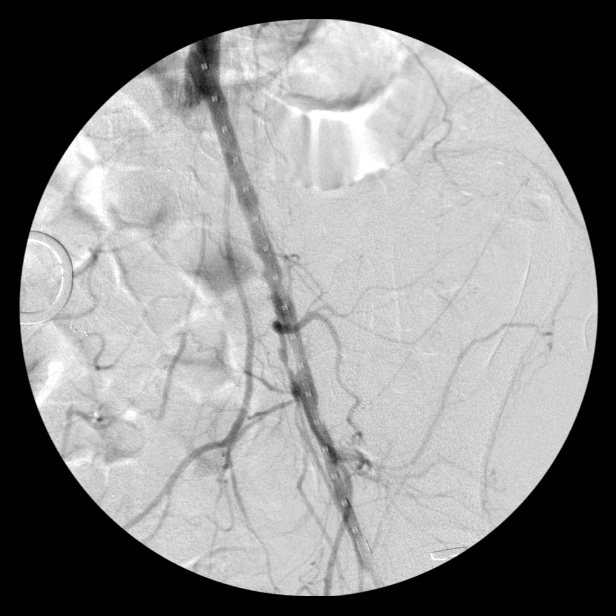
[im 3/5]
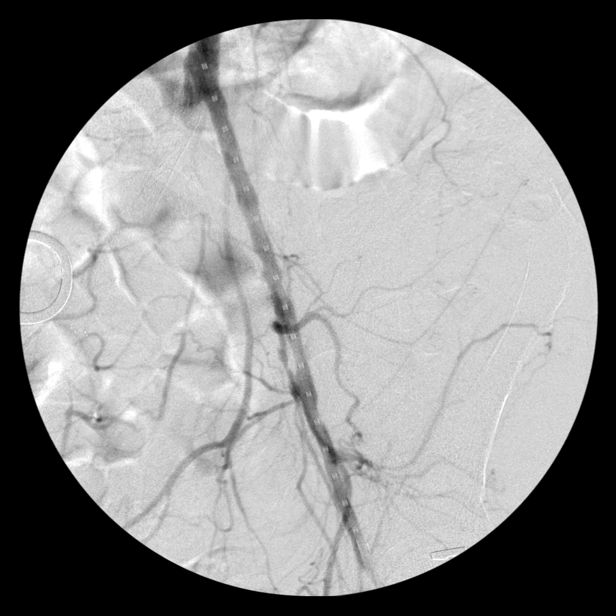
[im 4/5]
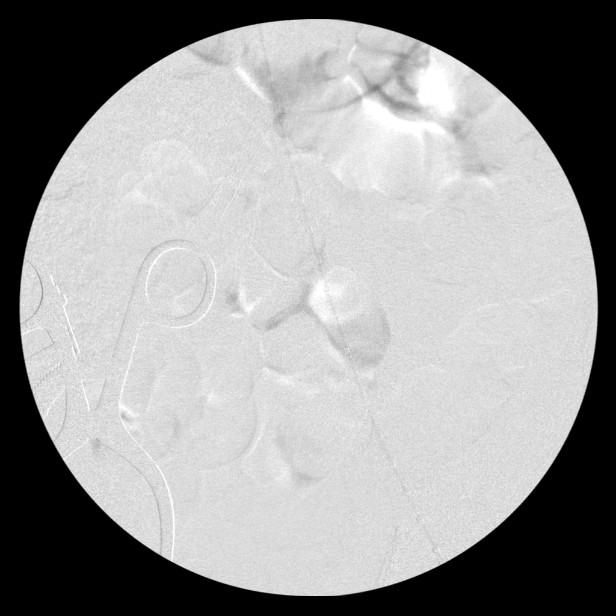
[im 4/5]
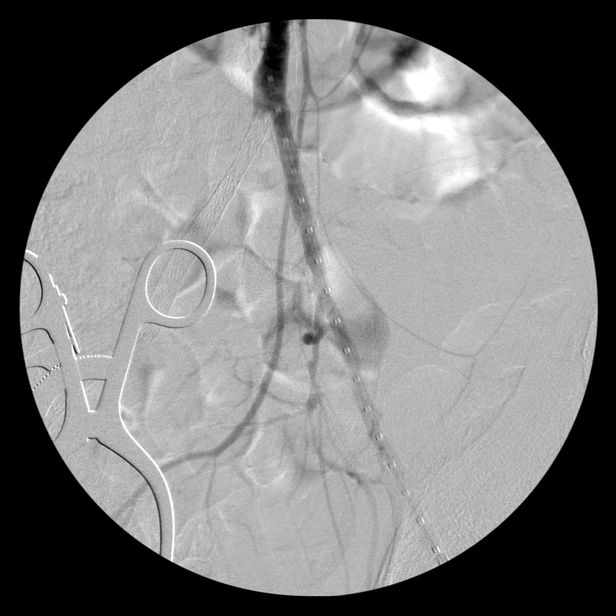
[im 4/5]
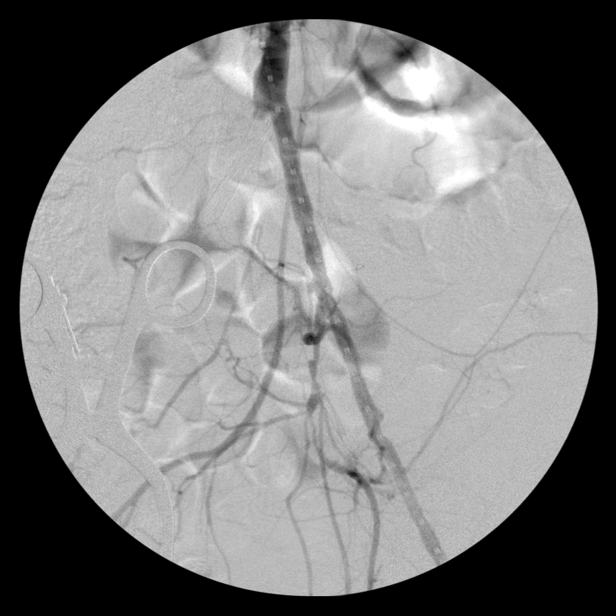
[im 4/5]
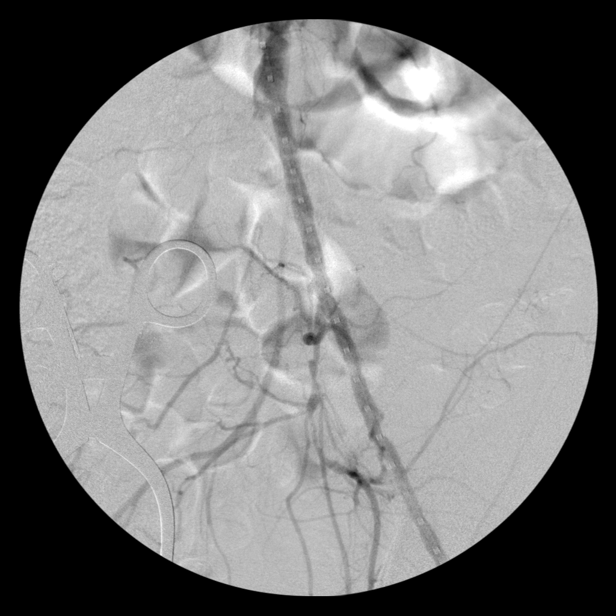
[im 5/5]
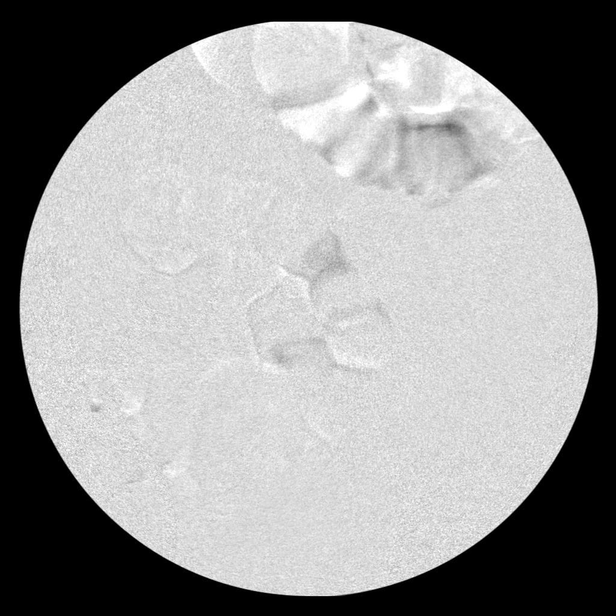
[im 5/5]
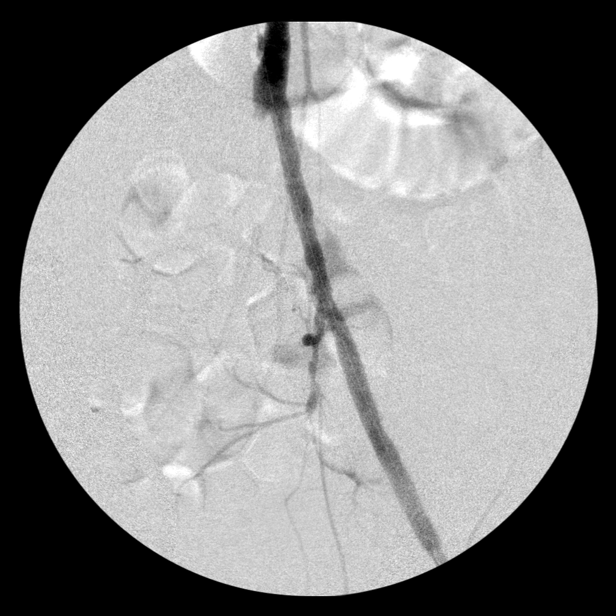
[im 5/5]
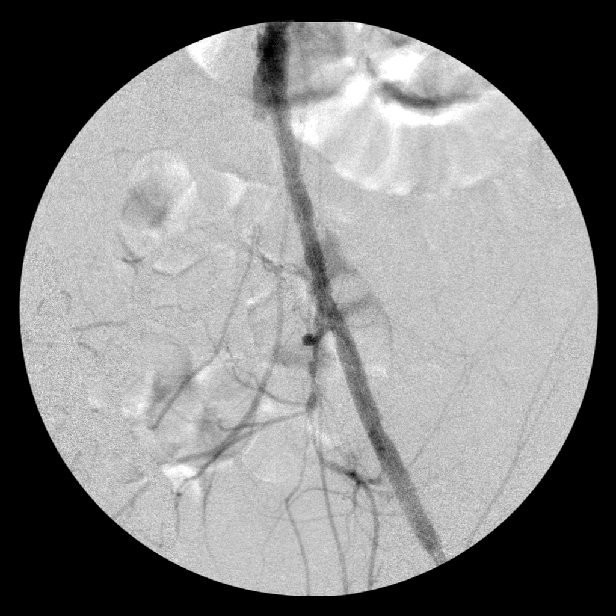
[im 5/5]
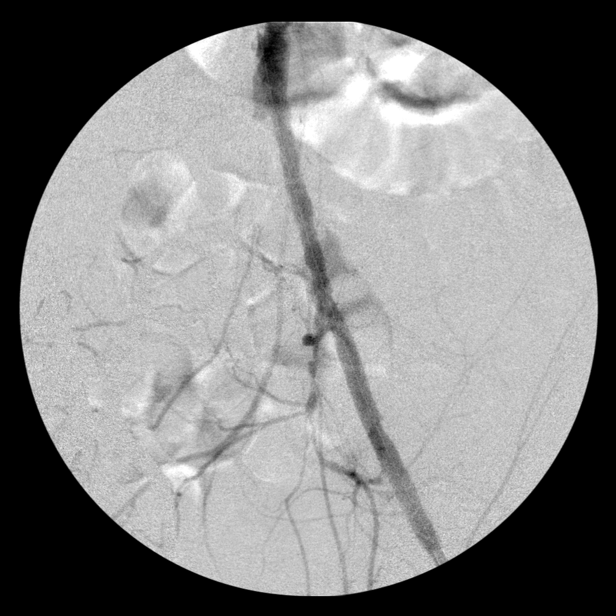

[14 of 14 positions shown; findings below may reference images not displayed]

FINDINGS: Two spot intraoperative images demonstrates a measuring catheter
within the LEFT common iliac artery and aorta. Stent in the RIGHT
common iliac artery. On dynamic fluoroscopic imaging patency of the
LEFT common iliac artery noted
IMPRESSION: Intraoperative views as above.

## 2022-08-21 ENCOUNTER — Other Ambulatory Visit: Payer: Self-pay

## 2022-08-21 DIAGNOSIS — I779 Disorder of arteries and arterioles, unspecified: Secondary | ICD-10-CM

## 2022-09-24 ENCOUNTER — Telehealth: Payer: Self-pay

## 2022-09-24 NOTE — Telephone Encounter (Signed)
Spoke with patient's wife, Anthony Meadows to confirm his 1 year appointment with Dr. Arbie Cookey.  [name] is presently at [facility].  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  He will continue his care at [facility].  She asked that his follow-up appointments and recalls be cancelled. Sachi Boulay [employer]

## 2022-09-25 ENCOUNTER — Ambulatory Visit: Payer: Medicare Other | Admitting: Vascular Surgery

## 2022-12-20 DEATH — deceased
